# Patient Record
Sex: Female | Born: 1995
Health system: Southern US, Community
[De-identification: ages and names within clinical notes are randomized; demographics above are authoritative.]

## PROBLEM LIST (undated history)

## (undated) DIAGNOSIS — R42 Dizziness and giddiness: Secondary | ICD-10-CM

## (undated) DIAGNOSIS — K5090 Crohn's disease, unspecified, without complications: Secondary | ICD-10-CM

## (undated) DIAGNOSIS — H209 Unspecified iridocyclitis: Secondary | ICD-10-CM

## (undated) DIAGNOSIS — M459 Ankylosing spondylitis of unspecified sites in spine: Secondary | ICD-10-CM

## (undated) HISTORY — DX: Ankylosing spondylitis of unspecified sites in spine: M45.9

## (undated) HISTORY — DX: Unspecified iridocyclitis: H20.9

## (undated) HISTORY — DX: Dizziness and giddiness: R42

## (undated) HISTORY — DX: Crohn's disease, unspecified, without complications: K50.90

---
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
## 2016-07-27 ENCOUNTER — Other Ambulatory Visit (HOSPITAL_COMMUNITY)
Admission: RE | Admit: 2016-07-27 | Discharge: 2016-07-27 | Disposition: A | Discharge status: Home / Self Care | Payer: Managed Care, Other (non HMO) | Source: Ambulatory Visit | Attending: Family Medicine | Admitting: Family Medicine

## 2016-07-27 ENCOUNTER — Encounter: Payer: Self-pay | Admitting: Family Medicine

## 2016-07-27 ENCOUNTER — Ambulatory Visit (INDEPENDENT_AMBULATORY_CARE_PROVIDER_SITE_OTHER): Payer: Managed Care, Other (non HMO) | Admitting: Family Medicine

## 2016-07-27 VITALS — BP 117/76 | HR 73 | Temp 98.7°F | Resp 20 | Ht 68.0 in | Wt 188.5 lb

## 2016-07-27 DIAGNOSIS — Z113 Encounter for screening for infections with a predominantly sexual mode of transmission: Secondary | ICD-10-CM | POA: Insufficient documentation

## 2016-07-27 DIAGNOSIS — Z0000 Encounter for general adult medical examination without abnormal findings: Secondary | ICD-10-CM

## 2016-07-27 DIAGNOSIS — Z111 Encounter for screening for respiratory tuberculosis: Secondary | ICD-10-CM

## 2016-07-27 DIAGNOSIS — Z23 Encounter for immunization: Secondary | ICD-10-CM

## 2016-07-27 DIAGNOSIS — Z79899 Other long term (current) drug therapy: Secondary | ICD-10-CM | POA: Insufficient documentation

## 2016-07-27 DIAGNOSIS — Z30011 Encounter for initial prescription of contraceptive pills: Secondary | ICD-10-CM

## 2016-07-27 DIAGNOSIS — Z3009 Encounter for other general counseling and advice on contraception: Secondary | ICD-10-CM

## 2016-07-27 DIAGNOSIS — Z114 Encounter for screening for human immunodeficiency virus [HIV]: Secondary | ICD-10-CM | POA: Diagnosis not present

## 2016-07-27 DIAGNOSIS — Z3041 Encounter for surveillance of contraceptive pills: Secondary | ICD-10-CM | POA: Insufficient documentation

## 2016-07-27 MED ORDER — NORETHINDRONE 0.35 MG PO TABS: 1.0000 | ORAL_TABLET | Freq: Every day | ORAL | 7 refills | Status: DC

## 2016-07-27 NOTE — Patient Instructions (Addendum)
Tdap given today.  We will call you with lab results.  I have called in your pills, make a PAP appt around October.   Health Maintenance, Female Introduction Adopting a healthy lifestyle and getting preventive care can go a long way to promote health and wellness. Talk with your health care provider about what schedule of regular examinations is right for you. This is a good chance for you to check in with your provider about disease prevention and staying healthy. In between checkups, there are plenty of things you can do on your own. Experts have done a lot of research about which lifestyle changes and preventive measures are most likely to keep you healthy. Ask your health care provider for more information. Weight and diet Eat a healthy diet  Be sure to include plenty of vegetables, fruits, low-fat dairy products, and lean protein.  Do not eat a lot of foods high in solid fats, added sugars, or salt.  Get regular exercise. This is one of the most important things you can do for your health.  Most adults should exercise for at least 150 minutes each week. The exercise should increase your heart rate and make you sweat (moderate-intensity exercise).  Most adults should also do strengthening exercises at least twice a week. This is in addition to the moderate-intensity exercise. Maintain a healthy weight  Body mass index (BMI) is a measurement that can be used to identify possible weight problems. It estimates body fat based on height and weight. Your health care provider can help determine your BMI and help you achieve or maintain a healthy weight.  For females 76 years of age and older:  A BMI below 18.5 is considered underweight.  A BMI of 18.5 to 24.9 is normal.  A BMI of 25 to 29.9 is considered overweight.  A BMI of 30 and above is considered obese. Watch levels of cholesterol and blood lipids  You should start having your blood tested for lipids and cholesterol at 22 years  of age, then have this test every 5 years.  You may need to have your cholesterol levels checked more often if:  Your lipid or cholesterol levels are high.  You are older than 22 years of age.  You are at high risk for heart disease. Cancer screening Lung Cancer  Lung cancer screening is recommended for adults 74-1 years old who are at high risk for lung cancer because of a history of smoking.  A yearly low-dose CT scan of the lungs is recommended for people who:  Currently smoke.  Have quit within the past 15 years.  Have at least a 30-pack-year history of smoking. A pack year is smoking an average of one pack of cigarettes a day for 1 year.  Yearly screening should continue until it has been 15 years since you quit.  Yearly screening should stop if you develop a health problem that would prevent you from having lung cancer treatment. Breast Cancer  Practice breast self-awareness. This means understanding how your breasts normally appear and feel.  It also means doing regular breast self-exams. Let your health care provider know about any changes, no matter how small.  If you are in your 20s or 30s, you should have a clinical breast exam (CBE) by a health care provider every 1-3 years as part of a regular health exam.  If you are 30 or older, have a CBE every year. Also consider having a breast X-ray (mammogram) every year.  If you  have a family history of breast cancer, talk to your health care provider about genetic screening.  If you are at high risk for breast cancer, talk to your health care provider about having an MRI and a mammogram every year.  Breast cancer gene (BRCA) assessment is recommended for women who have family members with BRCA-related cancers. BRCA-related cancers include:  Breast.  Ovarian.  Tubal.  Peritoneal cancers.  Results of the assessment will determine the need for genetic counseling and BRCA1 and BRCA2 testing. Cervical Cancer  Your  health care provider may recommend that you be screened regularly for cancer of the pelvic organs (ovaries, uterus, and vagina). This screening involves a pelvic examination, including checking for microscopic changes to the surface of your cervix (Pap test). You may be encouraged to have this screening done every 3 years, beginning at age 1.  For women ages 81-65, health care providers may recommend pelvic exams and Pap testing every 3 years, or they may recommend the Pap and pelvic exam, combined with testing for human papilloma virus (HPV), every 5 years. Some types of HPV increase your risk of cervical cancer. Testing for HPV may also be done on women of any age with unclear Pap test results.  Other health care providers may not recommend any screening for nonpregnant women who are considered low risk for pelvic cancer and who do not have symptoms. Ask your health care provider if a screening pelvic exam is right for you.  If you have had past treatment for cervical cancer or a condition that could lead to cancer, you need Pap tests and screening for cancer for at least 20 years after your treatment. If Pap tests have been discontinued, your risk factors (such as having a new sexual partner) need to be reassessed to determine if screening should resume. Some women have medical problems that increase the chance of getting cervical cancer. In these cases, your health care provider may recommend more frequent screening and Pap tests. Colorectal Cancer  This type of cancer can be detected and often prevented.  Routine colorectal cancer screening usually begins at 22 years of age and continues through 22 years of age.  Your health care provider may recommend screening at an earlier age if you have risk factors for colon cancer.  Your health care provider may also recommend using home test kits to check for hidden blood in the stool.  A small camera at the end of a tube can be used to examine your  colon directly (sigmoidoscopy or colonoscopy). This is done to check for the earliest forms of colorectal cancer.  Routine screening usually begins at age 55.  Direct examination of the colon should be repeated every 5-10 years through 22 years of age. However, you may need to be screened more often if early forms of precancerous polyps or small growths are found. Skin Cancer  Check your skin from head to toe regularly.  Tell your health care provider about any new moles or changes in moles, especially if there is a change in a mole's shape or color.  Also tell your health care provider if you have a mole that is larger than the size of a pencil eraser.  Always use sunscreen. Apply sunscreen liberally and repeatedly throughout the day.  Protect yourself by wearing long sleeves, pants, a wide-brimmed hat, and sunglasses whenever you are outside. Heart disease, diabetes, and high blood pressure  High blood pressure causes heart disease and increases the risk of stroke.  High blood pressure is more likely to develop in:  People who have blood pressure in the high end of the normal range (130-139/85-89 mm Hg).  People who are overweight or obese.  People who are African American.  If you are 70-34 years of age, have your blood pressure checked every 3-5 years. If you are 73 years of age or older, have your blood pressure checked every year. You should have your blood pressure measured twice-once when you are at a hospital or clinic, and once when you are not at a hospital or clinic. Record the average of the two measurements. To check your blood pressure when you are not at a hospital or clinic, you can use:  An automated blood pressure machine at a pharmacy.  A home blood pressure monitor.  If you are between 85 years and 68 years old, ask your health care provider if you should take aspirin to prevent strokes.  Have regular diabetes screenings. This involves taking a blood sample to  check your fasting blood sugar level.  If you are at a normal weight and have a low risk for diabetes, have this test once every three years after 22 years of age.  If you are overweight and have a high risk for diabetes, consider being tested at a younger age or more often. Preventing infection Hepatitis B  If you have a higher risk for hepatitis B, you should be screened for this virus. You are considered at high risk for hepatitis B if:  You were born in a country where hepatitis B is common. Ask your health care provider which countries are considered high risk.  Your parents were born in a high-risk country, and you have not been immunized against hepatitis B (hepatitis B vaccine).  You have HIV or AIDS.  You use needles to inject street drugs.  You live with someone who has hepatitis B.  You have had sex with someone who has hepatitis B.  You get hemodialysis treatment.  You take certain medicines for conditions, including cancer, organ transplantation, and autoimmune conditions. Hepatitis C  Blood testing is recommended for:  Everyone born from [birth year] through 1966.  Anyone with known risk factors for hepatitis C. Sexually transmitted infections (STIs)  You should be screened for sexually transmitted infections (STIs) including gonorrhea and chlamydia if:  You are sexually active and are younger than 22 years of age.  You are older than 22 years of age and your health care provider tells you that you are at risk for this type of infection.  Your sexual activity has changed since you were last screened and you are at an increased risk for chlamydia or gonorrhea. Ask your health care provider if you are at risk.  If you do not have HIV, but are at risk, it may be recommended that you take a prescription medicine daily to prevent HIV infection. This is called pre-exposure prophylaxis (PrEP). You are considered at risk if:  You are sexually active and do not regularly use  condoms or know the HIV status of your partner(s).  You take drugs by injection.  You are sexually active with a partner who has HIV. Talk with your health care provider about whether you are at high risk of being infected with HIV. If you choose to begin PrEP, you should first be tested for HIV. You should then be tested every 3 months for as long as you are taking PrEP. Pregnancy  If you are premenopausal and  you may become pregnant, ask your health care provider about preconception counseling.  If you may become pregnant, take 400 to 800 micrograms (mcg) of folic acid every day.  If you want to prevent pregnancy, talk to your health care provider about birth control (contraception). Osteoporosis and menopause  Osteoporosis is a disease in which the bones lose minerals and strength with aging. This can result in serious bone fractures. Your risk for osteoporosis can be identified using a bone density scan.  If you are 5 years of age or older, or if you are at risk for osteoporosis and fractures, ask your health care provider if you should be screened.  Ask your health care provider whether you should take a calcium or vitamin D supplement to lower your risk for osteoporosis.  Menopause may have certain physical symptoms and risks.  Hormone replacement therapy may reduce some of these symptoms and risks. Talk to your health care provider about whether hormone replacement therapy is right for you. Follow these instructions at home:  Schedule regular health, dental, and eye exams.  Stay current with your immunizations.  Do not use any tobacco products including cigarettes, chewing tobacco, or electronic cigarettes.  If you are pregnant, do not drink alcohol.  If you are breastfeeding, limit how much and how often you drink alcohol.  Limit alcohol intake to no more than 1 drink per day for nonpregnant women. One drink equals 12 ounces of beer, 5 ounces of wine, or 1 ounces of  hard liquor.  Do not use street drugs.  Do not share needles.  Ask your health care provider for help if you need support or information about quitting drugs.  Tell your health care provider if you often feel depressed.  Tell your health care provider if you have ever been abused or do not feel safe at home. This information is not intended to replace advice given to you by your health care provider. Make sure you discuss any questions you have with your health care provider. Document Released: 12/20/2010 Document Revised: 11/12/2015 Document Reviewed: 03/10/2015  2017 [employer]  Please help [place] help you:  We are honored you have chosen [facility] for your Primary Care home. Below you will find basic instructions that you may need to access in the future. Please help [place] help you by reading the instructions, which cover many of the frequent questions we experience.   Prescription refills and request:  -In order to allow more efficient response time, please call your pharmacy for all refills. They will forward the request electronically to [place]. This allows for the quickest possible response. Request left on a nurse line can take longer to refill, since these are checked as time allows between office patients and other phone calls.  - refill request can take up to 3-5 working days to complete.  - If request is sent electronically and request is appropiate, it is usually completed in 1-2 business days.  - all patients will need to be seen routinely for all chronic medical conditions requiring prescription medications (see follow-up below). If you are overdue for follow up on your condition, you will be asked to make an appointment and we will call in enough medication to cover you until your appointment (up to 30 days).  - all controlled substances will require a face to face visit to request/refill.  - if you desire your prescriptions to go through a new pharmacy, and have an active script  at original pharmacy, you  will need to call your pharmacy and have scripts transferred to new pharmacy. This is completed between the pharmacy locations and not by your provider.    Results: If any images or labs were ordered, it can take up to 1 week to get results depending on the test ordered and the lab/facility running and resulting the test. - Normal or stable results, which do not need further discussion, will be released to your mychart immediately with attached note to you. A call will not be generated for normal results. Please make certain to sign up for mychart. If you have questions on how to activate your mychart you can call the front office.  - If your results need further discussion, our office will attempt to contact you via phone, and if unable to reach you after 2 attempts, we will release your abnormal result to your mychart with instructions.  - All results will be automatically released in mychart after 1 week.  - Your provider will provide you with explanation and instruction on all relevant material in your results. Please keep in mind, results and labs may appear confusing or abnormal to the untrained eye, but it does not mean they are actually abnormal for you personally. If you have any questions about your results that are not covered, or you desire more detailed explanation than what was provided, you should make an appointment with your provider to do so.   Our office handles many outgoing and incoming calls daily. If we have not contacted you within 1 week about your results, please check your mychart to see if there is a message first and if not, then contact our office.  In helping with this matter, you help decrease call volume, and therefore allow [place] to be able to respond to patients needs more efficiently.   Acute office visits (sick visit):  An acute visit is intended for a new problem and are scheduled in shorter time slots to allow schedule openings for patients  with new problems. This is the appropriate visit to discuss a new problem. In order to provide you with excellent quality medical care with proper time for you to explain your problem, have an exam and receive treatment with instructions, these appointments should be limited to one new problem per visit. If you experience a new problem, in which you desire to be addressed, please make an acute office visit, we save openings on the schedule to accommodate you. Please do not save your new problem for any other type of visit, let us take care of it properly and quickly for you.   Follow up visits:  Depending on your condition(s) your provider will need to see you routinely in order to provide you with quality care and prescribe medication(s). Most chronic conditions (Example: hypertension, Diabetes, depression/anxiety... etc), require visits a couple times a year. Your provider will instruct you on proper follow up for your personal medical conditions and history. Please make certain to make follow up appointments for your condition as instructed. Failing to do so could result in lapse in your medication treatment/refills. If you request a refill, and are overdue to be seen on a condition, we will always provide you with a 30 day script (once) to allow you time to schedule.    Medicare wellness (well visit): - we have a wonderful Nurse Maudie Mercury), that will meet with you and provide you will yearly medicare wellness visits. These visits should occur yearly (can not be scheduled less than 1  calendar year apart) and cover preventive health, immunizations, advance directives and screenings you are entitled to yearly through your medicare benefits. Do not miss out on your entitled benefits, this is when medicare will pay for these benefits to be ordered for you.  These are strongly encouraged by your provider and is the appropriate type of visit to make certain you are up to date with all preventive health benefits. If  you have not had your medicare wellness exam in the last 12 months, please make certain to schedule one by calling the office and schedule your medicare wellness with Maudie Mercury as soon as possible.   Yearly physical (well visit):  - Adults are recommended to be seen yearly for physicals. Check with your insurance and date of your last physical, most insurances require one calendar year between physicals. Physicals include all preventive health topics, screenings, medical exam and labs that are appropriate for gender/age and history. You may have fasting labs needed at this visit. This is a well visit (not a sick visit), acute topics should not be covered during this visit.  - Pediatric patients are seen more frequently when they are younger. Your provider will advise you on well child visit timing that is appropriate for your their age. - This is not a medicare wellness visit. Medicare wellness exams do not have an exam portion to the visit. Some medicare companies allow for a physical, some do not allow a yearly physical. If your medicare allows a yearly physical you can schedule the medicare wellness with our nurse Maudie Mercury and have your physical with your provider after, on the same day. Please check with insurance for your full benefits.   Late Policy/No Shows:  - all new patients should arrive 15-30 minutes earlier than appointment to allow [place] time  to  obtain all personal demographics,  insurance information and for you to complete office paperwork. - All established patients should arrive 10-15 minutes earlier than appointment time to update all information and be checked in .  - In our best efforts to run on time, if you are late for your appointment you will be asked to either reschedule or if able, we will work you back into the schedule. There will be a wait time to work you back in the schedule,  depending on availability.  - If you are unable to make it to your appointment as scheduled, please call 24  hours ahead of time to allow [place] to fill the time slot with someone else who needs to be seen. If you do not cancel your appointment ahead of time, you may be charged a no show fee.

## 2016-07-27 NOTE — Progress Notes (Signed)
Patient ID: Toni Livingston, female  DOB: 1995/09/04, 21 y.o.   MRN: [id number] Patient Care Team    Relationship Specialty Notifications Start End  Ma Hillock, DO PCP - General Family Medicine  07/27/16     Subjective:  Toni Livingston is a 21 y.o.  female present for new patient establishment. All past medical history, surgical history, allergies, family history, immunizations, medications and social history were updated/obtained in the electronic medical record today. All recent labs, ED visits and hospitalizations within the last year were reviewed. Moved from [place] in March 2017.  Well women exam: LMP 07/14/2016. Pt states she has not been sexually active in over 4 years. She is wanting to restart her birth control. She is now seeing someone, but has not become sexually active with them yet. She states many BCP pills have made her ill. The last she took that did not was micronor. She has never had a PAP, she has had a pelvic at her prior primary care provider in [place]. Her menstrual cycle  are about every 30 days, strong cramps, heavy for 1-2 days and last about 5 days.  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 Patient does not have medical records with her today. She reports no h/o of chronic physical or mental illness. She is denies any physical or mental causes she would not be able to work with kids. 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 She is in need of a PPD test today. Flu shot UTD 2017. She has not had the tdap in about 10 years. She is agreeable to HIV testing and urine cytology.   Depression screen PHQ 2/9 07/27/2016  Decreased Interest 0  Down, Depressed, Hopeless 0  PHQ - 2 Score 0     Immunization History  Administered Date(s) Administered  . Influenza-Unspecified 03/20/2016  . PPD Test 07/27/2016  . Tdap 07/27/2016      History reviewed. No pertinent past medical history. No Known Allergies Past Surgical History:  Procedure Laterality Date  . BREAST REDUCTION SURGERY  2015   Family History  Problem Relation Age of Onset  . Testicular cancer Father   . Uterine cancer Maternal Aunt    Social History   Social History  . Marital status: Single    Spouse name: N/A  . Number of children: 0  . Years of education: 55   Occupational History  . teaching assistant    Social History Main Topics  . Smoking status: Never Smoker  . Smokeless tobacco: Never Used  . Alcohol use No  . Drug use: No  . Sexual activity: Yes   Other Topics Concern  . Not on file   Social History Narrative   Single. Some college.    Works as Therapist, nutritional.    Drinks caffeine.    Wears seatbelt. Smoke detector in the home.    Exercises routinely.    Feels safe in relationships.          Allergies as of 07/27/2016   No Known Allergies     Medication List       Accurate as of 07/27/16 12:36 PM. Always use your most recent med list.          norethindrone 0.35 MG tablet Commonly known as:  ORTHO MICRONOR Take 1 tablet (0.35 mg total) by mouth daily.  No results found for this or any previous visit (from the past 2160 hour(s)).  Patient was never admitted.   ROS: 14 pt review of systems performed and negative (unless mentioned in an HPI)  Objective: BP 117/76 (BP Location: Right Arm, Patient Position: Sitting, Cuff Size: Normal)   Pulse 73   Temp 98.7 F (37.1 C)   Resp 20   Ht 5\' 8"  (1.727 m)   Wt 188 lb 8 oz (85.5 kg)   LMP 07/14/2016 (Exact Date)   SpO2 99%   BMI 28.66 kg/m  Gen: Afebrile. No acute distress. Nontoxic in appearance, well-developed, well-nourished,  Pleasant caucasian female. HENT: AT. [place]. Bilateral TM visualized and normal in appearance, normal external auditory canal. MMM, no oral lesions, adequate dentition. Bilateral nares within normal limits. Throat  without erythema, ulcerations or exudates. no Cough on exam, no hoarseness on exam. Eyes:Pupils Equal Round Reactive to light, Extraocular movements intact,  Conjunctiva without redness, discharge or icterus. Neck/lymp/endocrine: Supple,no lymphadenopathy, no thyromegaly CV: RRR no murmur, noedema, +2/4 P posterior tibialis pulses.  Chest: CTAB, no wheeze, rhonchi or crackles. Normal Respiratory effort. good Air movement. Abd: Soft. flat. NTND. BS present. no Masses palpated. No hepatosplenomegaly. No rebound tenderness or guarding. Skin: no rashes, purpura or petechiae. Warm and well-perfused. Skin intact. Neuro/Msk:  Normal gait. PERLA. EOMi. Alert. Oriented x3.  Cranial nerves II through XII intact. Muscle strength 5/5 upper/lower extremity. DTRs equal bilaterally. Psych: Normal affect, dress and demeanor. Normal speech. Normal thought content and judgment.   Assessment/plan: Toni Livingston is a 21 y.o. female present for establish care and CPE.  Screening for tuberculosis - PPD, return in 48-72 hours for nurse read. Papers will be completed at that time and returned to patient.  Birth control counseling - Pt provided with BCp. Instructions on use. She will need to schedule PAP before November.  - norethindrone (ORTHO MICRONOR) 0.35 MG tablet; Take 1 tablet (0.35 mg total) by mouth daily.  Dispense: 1 Package; Refill: 7 Screen for STD (sexually transmitted disease) - HIV antibody- pt agreeable.  - Urine cytology ancillary only Encounter for screening for HIV - HIV antibody Immunization due - Tdap vaccine greater than or equal to 7yo IM Encounter for preventive health examination Patient was encouraged to exercise greater than 150 minutes a week. Patient was encouraged to choose a diet filled with fresh fruits and vegetables, and lean meats. AVS provided to patient today for education/recommendation on gender specific health and safety maintenance.  Return in about 1 year (around  07/27/2017) for CPE.  Electronically signed by: Howard Pouch, DO [place]

## 2016-07-28 ENCOUNTER — Telehealth: Payer: Self-pay | Admitting: Family Medicine

## 2016-07-28 LAB — HIV ANTIBODY (ROUTINE TESTING W REFLEX): HIV: NONREACTIVE

## 2016-07-28 LAB — URINE CYTOLOGY ANCILLARY ONLY
CHLAMYDIA, DNA PROBE: NEGATIVE
NEISSERIA GONORRHEA: NEGATIVE

## 2016-07-28 NOTE — Telephone Encounter (Signed)
Pt has a nurse visit either today or Friday to have PPD reading. Please inform her all her labs were normal when she comes in and picks up paperwork.

## 2016-07-29 ENCOUNTER — Ambulatory Visit: Payer: Managed Care, Other (non HMO)

## 2016-07-29 LAB — TB SKIN TEST
INDURATION: 0 mm
TB Skin Test: NEGATIVE

## 2016-07-29 NOTE — Telephone Encounter (Signed)
Lab results reviewed with patient by Mickel Baas CMA

## 2016-09-07 ENCOUNTER — Encounter: Payer: Self-pay | Admitting: Family Medicine

## 2016-09-07 ENCOUNTER — Ambulatory Visit (INDEPENDENT_AMBULATORY_CARE_PROVIDER_SITE_OTHER): Payer: Managed Care, Other (non HMO) | Admitting: Family Medicine

## 2016-09-07 VITALS — BP 112/74 | HR 88 | Temp 98.8°F | Resp 20 | Wt 183.5 lb

## 2016-09-07 DIAGNOSIS — R42 Dizziness and giddiness: Secondary | ICD-10-CM

## 2016-09-07 MED ORDER — ONDANSETRON HCL 4 MG PO TABS: 4.0000 mg | ORAL_TABLET | Freq: Three times a day (TID) | ORAL | 0 refills | Status: DC | PRN

## 2016-09-07 MED ORDER — MECLIZINE HCL 25 MG PO TABS: 25.0000 mg | ORAL_TABLET | Freq: Three times a day (TID) | ORAL | 0 refills | Status: DC | PRN

## 2016-09-07 NOTE — Progress Notes (Signed)
Toni Livingston , May 25, 1996, 21 y.o., female MRN: [id number] Patient Care Team    Relationship Specialty Notifications Start End  Ma Hillock, DO PCP - General Family Medicine  07/27/16     CC: dizziness  Subjective: Pt presents for an OV with complaints of dizziness of 1 day duration. Pt states this has occurred in the past. She started to experience room spinning sensations about 4 years ago about every 2-4 times a year. Typically it last a day, and then self resolves. She is uncertain if events are proceeded with an acute illness or not. This event started yesterday morning upon waking up and has lasted. It is unusual for her for her to have it last past 1 day. She was sick about 1 week ago with a cold.  Associated symptoms include left ear fullness, right ear ringing yesterday (not current), mild headache (resolved) and nausea. Moving her head or transitioning make it worse and the room "moves". She denies fever, chills, hearing loss, headache, visual changes or syncope. She has never seen a doctor for this condition. She has tried benadryl when it occurs and it can be helpful. She is on BCP that was restarted 1 month ago. She states she has been on BCP on/off in the past and this has occurred both on and off medications.     Depression screen PHQ 2/9 07/27/2016  Decreased Interest 0  Down, Depressed, Hopeless 0  PHQ - 2 Score 0    No Known Allergies Social History  Substance Use Topics  . Smoking status: Never Smoker  . Smokeless tobacco: Never Used  . Alcohol use No   History reviewed. No pertinent past medical history. Past Surgical History:  Procedure Laterality Date  . BREAST REDUCTION SURGERY  2015   Family History  Problem Relation Age of Onset  . Testicular cancer Father   . Uterine cancer Maternal Aunt    Allergies as of 09/07/2016   No Known Allergies     Medication List       Accurate as of 09/07/16 10:36 AM. Always use your most recent med list.          norethindrone 0.35 MG tablet Commonly known as:  ORTHO MICRONOR Take 1 tablet (0.35 mg total) by mouth daily.       No results found for this or any previous visit (from the past 24 hour(s)). No results found.   ROS: Negative, with the exception of above mentioned in HPI   Objective:  BP 112/74 (BP Location: Right Arm, Patient Position: Sitting, Cuff Size: Large)   Pulse 88   Temp 98.8 F (37.1 C)   Resp 20   Wt 183 lb 8 oz (83.2 kg)   SpO2 97%   BMI 27.90 kg/m  Body mass index is 27.9 kg/m. Gen: Afebrile. No acute distress. Nontoxic in appearance, well developed, well nourished.  HENT: AT. [place]. Bilateral TM visualized with bilateral fullness. MMM, no oral lesions. Bilateral nares WNL. Throat without erythema or exudates. No cough or hoarseness.  Eyes:Pupils Equal Round Reactive to light, Extraocular movements intact,  Conjunctiva without redness, discharge or icterus. Neck/lymp/endocrine: Supple,no lymphadenopathy CV: RRR Chest: CTAB, no wheeze or crackles.  Abd: Soft. NTND. BS present Neuro:  Normal gait. PERLA. EOMi. Alert. Oriented x3  Psych: Normal affect, dress and demeanor. Normal speech. Normal thought content and judgment. Dix-hal pike positive right.    Assessment/Plan: Toni Livingston is a 21 y.o. female present for OV for  Vertigo -  discussed potential ddx of BPPV, inner ear issues, atypical migraines - Hydrate, meclizine, flonase start, zofran and Epley maneuver  info provided.  - if condition worsens after above regimen, occurs more frequently or does not resolve would want to investigate further.  - F/u PRN   Reviewed expectations re: course of current medical issues.  Discussed self-management of symptoms.  Outlined signs and symptoms indicating need for more acute intervention.  Patient verbalized understanding and all questions were answered.  Patient received an After-Visit Summary.   electronically signed by:  Howard Pouch, DO  [place]

## 2016-09-07 NOTE — Patient Instructions (Signed)
Benign Positional Vertigo Vertigo is the feeling that you or your surroundings are moving when they are not. Benign positional vertigo is the most common form of vertigo. The cause of this condition is not serious (is benign). This condition is triggered by certain movements and positions (is positional). This condition can be dangerous if it occurs while you are doing something that could endanger you or others, such as driving. What are the causes? In many cases, the cause of this condition is not known. It may be caused by a disturbance in an area of the inner ear that helps your brain to sense movement and balance. This disturbance can be caused by a viral infection (labyrinthitis), head injury, or repetitive motion. What increases the risk? This condition is more likely to develop in:  Women.  People who are 50 years of age or older. What are the signs or symptoms? Symptoms of this condition usually happen when you move your head or your eyes in different directions. Symptoms may start suddenly, and they usually last for less than a minute. Symptoms may include:  Loss of balance and falling.  Feeling like you are spinning or moving.  Feeling like your surroundings are spinning or moving.  Nausea and vomiting.  Blurred vision.  Dizziness.  Involuntary eye movement (nystagmus). Symptoms can be mild and cause only slight annoyance, or they can be severe and interfere with daily life. Episodes of benign positional vertigo may return (recur) over time, and they may be triggered by certain movements. Symptoms may improve over time. How is this diagnosed? This condition is usually diagnosed by medical history and a physical exam of the head, neck, and ears. You may be referred to a health care provider who specializes in ear, nose, and throat (ENT) problems (otolaryngologist) or a provider who specializes in disorders of the nervous system (neurologist). You may have additional testing,  including:  MRI.  A CT scan.  Eye movement tests. Your health care provider may ask you to change positions quickly while he or she watches you for symptoms of benign positional vertigo, such as nystagmus. Eye movement may be tested with an electronystagmogram (ENG), caloric stimulation, the Dix-Hallpike test, or the roll test.  An electroencephalogram (EEG). This records electrical activity in your brain.  Hearing tests. How is this treated? Usually, your health care provider will treat this by moving your head in specific positions to adjust your inner ear back to normal. Surgery may be needed in severe cases, but this is rare. In some cases, benign positional vertigo may resolve on its own in 2-4 weeks. Follow these instructions at home: Safety   Move slowly.Avoid sudden body or head movements.  Avoid driving.  Avoid operating heavy machinery.  Avoid doing any tasks that would be dangerous to you or others if a vertigo episode would occur.  If you have trouble walking or keeping your balance, try using a cane for stability. If you feel dizzy or unstable, sit down right away.  Return to your normal activities as told by your health care provider. Ask your health care provider what activities are safe for you. General instructions   Take over-the-counter and prescription medicines only as told by your health care provider.  Avoid certain positions or movements as told by your health care provider.  Drink enough fluid to keep your urine clear or pale yellow.  Keep all follow-up visits as told by your health care provider. This is important. Contact a health care provider   if:  You have a fever.  Your condition gets worse or you develop new symptoms.  Your family or friends notice any behavioral changes.  Your nausea or vomiting gets worse.  You have numbness or a "pins and needles" sensation. Get help right away if:  You have difficulty speaking or moving.  You are  always dizzy.  You faint.  You develop severe headaches.  You have weakness in your legs or arms.  You have changes in your hearing or vision.  You develop a stiff neck.  You develop sensitivity to light. This information is not intended to replace advice given to you by your health care provider. Make sure you discuss any questions you have with your health care provider. Document Released: 03/14/2006 Document Revised: 11/12/2015 Document Reviewed: 09/29/2014 Elsevier Interactive Patient Education  2017 [employer].   How to Perform the Epley Maneuver The Epley maneuver is an exercise that relieves symptoms of vertigo. Vertigo is the feeling that you or your surroundings are moving when they are not. When you feel vertigo, you may feel like the room is spinning and have trouble walking. Dizziness is a little different than vertigo. When you are dizzy, you may feel unsteady or light-headed. You can do this maneuver at home whenever you have symptoms of vertigo. You can do it up to 3 times a day until your symptoms go away. Even though the Epley maneuver may relieve your vertigo for a few weeks, it is possible that your symptoms will return. This maneuver relieves vertigo, but it does not relieve dizziness. What are the risks? If it is done correctly, the Epley maneuver is considered safe. Sometimes it can lead to dizziness or nausea that goes away after a short time. If you develop other symptoms, such as changes in vision, weakness, or numbness, stop doing the maneuver and call your health care provider. How to perform the Epley maneuver 1. Sit on the edge of a bed or table with your back straight and your legs extended or hanging over the edge of the bed or table. 2. Turn your head halfway toward the affected ear or side. 3. Lie backward quickly with your head turned until you are lying flat on your back. You may want to position a pillow under your shoulders. 4. Hold this position  for 30 seconds. You may experience an attack of vertigo. This is normal. 5. Turn your head to the opposite direction until your unaffected ear is facing the floor. 6. Hold this position for 30 seconds. You may experience an attack of vertigo. This is normal. Hold this position until the vertigo stops. 7. Turn your whole body to the same side as your head. Hold for another 30 seconds. 8. Sit back up. You can repeat this exercise up to 3 times a day. Follow these instructions at home:  After doing the Epley maneuver, you can return to your normal activities.  Ask your health care provider if there is anything you should do at home to prevent vertigo. He or she may recommend that you:  Keep your head raised (elevated) with two or more pillows while you sleep.  Do not sleep on the side of your affected ear.  Get up slowly from bed.  Avoid sudden movements during the day.  Avoid extreme head movement, like looking up or bending over. Contact a health care provider if:  Your vertigo gets worse.  You have other symptoms, including:  Nausea.  Vomiting.  Headache. Get  help right away if:  You have vision changes.  You have a severe or worsening headache or neck pain.  You cannot stop vomiting.  You have new numbness or weakness in any part of your body. Summary  Vertigo is the feeling that you or your surroundings are moving when they are not.  The Epley maneuver is an exercise that relieves symptoms of vertigo.  If the Epley maneuver is done correctly, it is considered safe. You can do it up to 3 times a day. This information is not intended to replace advice given to you by your health care provider. Make sure you discuss any questions you have with your health care provider. Document Released: 06/11/2013 Document Revised: 04/26/2016 Document Reviewed: 04/26/2016 Elsevier Interactive Patient Education  2017 [employer]   Labyrinthitis Labyrinthitis is an infection of  the inner ear. Your inner ear is a system of tubes and canals (labyrinth). These are filled with fluid. Nerve cells in your inner ear send signals for hearing and balance to your brain. When tiny germs get inside the tubes and canals, they harm the cells that send messages to the brain. This can cause changes in hearing and balance. Follow these instructions at home:  Take medicines only as told by your doctor.  If you were prescribed an antibiotic medicine, finish all of it even if you start to feel better.  Rest as much as possible.  Avoid loud noises and bright lights.  Do not make sudden movements until any dizziness goes away.  Do not drive until your doctor says that you can.  Drink enough fluid to keep your pee (urine) clear or pale yellow.  Work with a physical therapist if you still feel dizzy after several weeks. A therapist can teach you exercises to help you deal with your dizziness.  Keep all follow-up visits as told by your doctor. This is important. Contact a doctor if:  Your symptoms do not get better with medicines.  You do not get better after two weeks.  You have a fever. Get help right away if:  You are very dizzy.  You keep throwing up (vomiting) or keep feeling sick to your stomach (nauseous).  Your hearing gets a lot worse very quickly. This information is not intended to replace advice given to you by your health care provider. Make sure you discuss any questions you have with your health care provider. Document Released: 06/06/2005 Document Revised: 11/12/2015 Document Reviewed: 03/18/2014 Elsevier Interactive Patient Education  2017 [employer].

## 2016-10-28 ENCOUNTER — Encounter: Payer: Self-pay | Admitting: Family Medicine

## 2016-10-28 ENCOUNTER — Ambulatory Visit (INDEPENDENT_AMBULATORY_CARE_PROVIDER_SITE_OTHER): Payer: Managed Care, Other (non HMO) | Admitting: Family Medicine

## 2016-10-28 VITALS — BP 101/68 | HR 84 | Temp 98.1°F | Resp 16 | Ht 68.0 in | Wt 184.0 lb

## 2016-10-28 DIAGNOSIS — N3000 Acute cystitis without hematuria: Secondary | ICD-10-CM | POA: Diagnosis not present

## 2016-10-28 LAB — POCT URINALYSIS DIPSTICK
Bilirubin, UA: NEGATIVE
Glucose, UA: NEGATIVE
KETONES UA: NEGATIVE
Nitrite, UA: NEGATIVE
PH UA: 6 (ref 5.0–8.0)
PROTEIN UA: NEGATIVE
SPEC GRAV UA: 1.015 (ref 1.010–1.025)
UROBILINOGEN UA: 0.2 U/dL

## 2016-10-28 MED ORDER — CIPROFLOXACIN HCL 500 MG PO TABS: 500.0000 mg | ORAL_TABLET | Freq: Two times a day (BID) | ORAL | 0 refills | Status: AC

## 2016-10-28 NOTE — Progress Notes (Signed)
OFFICE VISIT  10/28/2016   CC:  Chief Complaint  Patient presents with  . Urinary Frequency    off and on x 3-4 weeks, has took otc uricalm and some left over antibiotic   HPI:    Patient is a 21 y.o.  female who presents for urinary symptoms. Onset 3-4 weeks ago of urinary frequency, having to urinate but nothing comes out, dysuria.  Uricalm otc helped. Had one episode of gross hematuria about 2 wks, then 2 d/a.  Most recent uricalm was 2 d/a. No fever.  No abd pain or flank pain, no nausea.  Says this is what it felt like when she had UTI 2-3 yrs ago.   Took some leftover abx at home, does not recall the name---took it bid x 7d.  Sx's got a little better, then 2d after stopping, all symptoms returned the same.  She is not sexually active and says there is no way she is pregnant. LMP was 09/28/16.  Past Medical History:  Diagnosis Date  . Vertigo     Past Surgical History:  Procedure Laterality Date  . BREAST REDUCTION SURGERY  2015    Outpatient Medications Prior to Visit  Medication Sig Dispense Refill  . norethindrone (ORTHO MICRONOR) 0.35 MG tablet Take 1 tablet (0.35 mg total) by mouth daily. 1 Package 7  . meclizine (ANTIVERT) 25 MG tablet Take 1 tablet (25 mg total) by mouth 3 (three) times daily as needed. (Patient not taking: Reported on 10/28/2016) 60 tablet 0  . ondansetron (ZOFRAN) 4 MG tablet Take 1 tablet (4 mg total) by mouth every 8 (eight) hours as needed for nausea or vomiting. (Patient not taking: Reported on 10/28/2016) 20 tablet 0   No facility-administered medications prior to visit.     No Known Allergies  ROS As per HPI  PE: Blood pressure 101/68, pulse 84, temperature 98.1 F (36.7 C), temperature source Oral, resp. rate 16, height 5\' 8"  (1.727 m), weight 184 lb (83.5 kg), last menstrual period 09/28/2016, SpO2 98 %. Gen: Alert, well appearing.  Patient is oriented to person, place, time, and situation. AFFECT: pleasant, lucid thought and  speech. No further exam today.  LABS:  CC UA today: trace intact blood, trace LEU, o/w normal.  Urine slightly cloudy.    Chemistry   No results found for: NA, K, CL, CO2, BUN, CREATININE, GLU No results found for: CALCIUM, ALKPHOS, AST, ALT, BILITOT    IMPRESSION AND PLAN:  Urinary tract infection with hematuria:  Cipro 500 mg bid x 7d. Urine sent for c/s today.  Signs/symptoms to call or return for were reviewed and pt expressed understanding.  An After Visit Summary was printed and given to the patient.  FOLLOW UP: Return if symptoms worsen or fail to improve.  Signed:  Crissie Sickles, MD           10/28/2016

## 2016-10-30 LAB — URINE CULTURE

## 2017-02-08 ENCOUNTER — Other Ambulatory Visit: Payer: Self-pay | Admitting: *Deleted

## 2017-02-08 ENCOUNTER — Other Ambulatory Visit: Payer: Self-pay | Admitting: Family Medicine

## 2017-02-08 DIAGNOSIS — Z3009 Encounter for other general counseling and advice on contraception: Secondary | ICD-10-CM

## 2017-02-08 MED ORDER — NORETHINDRONE 0.35 MG PO TABS: 1.0000 | ORAL_TABLET | Freq: Every day | ORAL | 2 refills | Status: DC

## 2017-02-17 ENCOUNTER — Encounter: Payer: Self-pay | Admitting: Family Medicine

## 2017-02-17 ENCOUNTER — Ambulatory Visit (INDEPENDENT_AMBULATORY_CARE_PROVIDER_SITE_OTHER): Payer: Managed Care, Other (non HMO) | Admitting: Family Medicine

## 2017-02-17 VITALS — BP 97/66 | HR 71 | Temp 98.3°F | Resp 20 | Wt 184.0 lb

## 2017-02-17 DIAGNOSIS — M79604 Pain in right leg: Secondary | ICD-10-CM

## 2017-02-17 DIAGNOSIS — M545 Low back pain: Secondary | ICD-10-CM

## 2017-02-17 MED ORDER — CYCLOBENZAPRINE HCL 5 MG PO TABS: 5.0000 mg | ORAL_TABLET | Freq: Three times a day (TID) | ORAL | 1 refills | Status: DC | PRN

## 2017-02-17 MED ORDER — METHYLPREDNISOLONE ACETATE 80 MG/ML IJ SUSP
80.0000 mg | Freq: Once | INTRAMUSCULAR | Status: AC
  Administered 2017-02-17: 80 mg via INTRAMUSCULAR

## 2017-02-17 MED ORDER — NAPROXEN 500 MG PO TABS: 500.0000 mg | ORAL_TABLET | Freq: Two times a day (BID) | ORAL | 0 refills | Status: DC

## 2017-02-17 NOTE — Progress Notes (Signed)
Toni Livingston , 1996/01/16, 21 y.o., female MRN: [id number] Patient Care Team    Relationship Specialty Notifications Start End  Ma Hillock, DO PCP - General Family Medicine  07/27/16     Chief Complaint  Patient presents with  . Back Pain    lifting injury     Subjective: Pt presents for an OV with complaints of back pain  of today duration.  Associated symptoms include lumbar back pain, with radiation to bilateral calves after picking up a box at work. She works at [employer] and felt immediate discomfort after picking up a box. She has had similar discomfort in the past affecting only her left SI and sciatica. She reports she performs stretches before work and typically never has an issue with lifting. She doe snot wear a back brace at work. She has tried taking a dose of naprosyn today, without much relief. She denies bladder or bowel dysfunction. The pain is worse with bending forward.  Depression screen PHQ 2/9 07/27/2016  Decreased Interest 0  Down, Depressed, Hopeless 0  PHQ - 2 Score 0    No Known Allergies Social History  Substance Use Topics  . Smoking status: Never Smoker  . Smokeless tobacco: Never Used  . Alcohol use No   Past Medical History:  Diagnosis Date  . Vertigo    Past Surgical History:  Procedure Laterality Date  . BREAST REDUCTION SURGERY  2015   Family History  Problem Relation Age of Onset  . Testicular cancer Father   . Uterine cancer Maternal Aunt    Allergies as of 02/17/2017   No Known Allergies     Medication List       Accurate as of 02/17/17  1:37 PM. Always use your most recent med list.          norethindrone 0.35 MG tablet Commonly known as:  ORTHO MICRONOR Take 1 tablet (0.35 mg total) by mouth daily.       All past medical history, surgical history, allergies, family history, immunizations andmedications were updated in the EMR today and reviewed under the history and medication portions of their EMR.      ROS: Negative, with the exception of above mentioned in HPI   Objective:  BP 97/66 (BP Location: Left Arm, Patient Position: Sitting, Cuff Size: Large)   Pulse 71   Temp 98.3 F (36.8 C)   Resp 20   Wt 184 lb (83.5 kg)   SpO2 99%   BMI 27.98 kg/m  Body mass index is 27.98 kg/m. Gen: Afebrile. No acute distress. Nontoxic in appearance, well developed, well nourished.  HENT: AT. [name]. MMM Eyes:Pupils Equal Round Reactive to light, Extraocular movements intact,  Conjunctiva without redness, discharge or icterus. MSK: no erythema, no soft tissue swelling lumbar spine. No bone TTP lumbar, sacral or SI. TTP bilateral piriformis. Full ROM present with discomfort Left SB, flexion and extension lumbar spine. 5/5 bilateral LE MS. NV intact distally.  Skin: no rashes, purpura or petechiae.  Neuro: Normal gait. PERLA. EOMi. Alert. Oriented x3   No exam data present No results found. No results found for this or any previous visit (from the past 24 hour(s)).  Assessment/Plan: Toni Livingston is a 21 y.o. female present for OV for  1. Lumbar pain with radiation down both legs - cyclobenzaprine (FLEXERIL) 5 MG tablet; Take 1 tablet (5 mg total) by mouth 3 (three) times daily as needed for muscle spasms.  Dispense: 30 tablet; Refill: 1 -  naproxen (NAPROSYN) 500 MG tablet; Take 1 tablet (500 mg total) by mouth 2 (two) times daily with a meal.  Dispense: 30 tablet; Refill: 0 - methylPREDNISolone acetate (DEPO-MEDROL) injection 80 mg; Inject 1 mL (80 mg total) into the muscle once. - IM depo medrol today.  - flexeril 5 mg and naproxen 500 BID prescribed today.  - ice/heat instructions provided to use over the weekend. Rest, no lifting. Work excuse provided today. May return next week as scheduled if improved. - encouraged use of back brace at work, as well as continue stretches.  - follow up 2-4 weeks if not improving or worsening.   Reviewed expectations re: course of current medical  issues.  Discussed self-management of symptoms.  Outlined signs and symptoms indicating need for more acute intervention.  Patient verbalized understanding and all questions were answered.  Patient received an After-Visit Summary.    No orders of the defined types were placed in this encounter.    Note is dictated utilizing voice recognition software. Although note has been proof read prior to signing, occasional typographical errors still can be missed. If any questions arise, please do not hesitate to call for verification.   electronically signed by:  Howard Pouch, DO  [place]

## 2017-02-17 NOTE — Patient Instructions (Signed)
Rest, ice for 2-3 days then heat application (never more than 15 minutes at a time).  No lifting for 5-7 days. Look into back brace (employers can provide this for you).  Start light stretches in about 5-7 days.   Start naproxen this evening with a meal, and take every 12 hours with food for 7 days.   Start flexeril every 12 hours if able, may make you sleepy. If makes you too sleepy take only at night or not driving.     Sciatica Sciatica is pain, numbness, weakness, or tingling along the path of the sciatic nerve. The sciatic nerve starts in the lower back and runs down the back of each leg. The nerve controls the muscles in the lower leg and in the back of the knee. It also provides feeling (sensation) to the back of the thigh, the lower leg, and the sole of the foot. Sciatica is a symptom of another medical condition that pinches or puts pressure on the sciatic nerve. Generally, sciatica only affects one side of the body. Sciatica usually goes away on its own or with treatment. In some cases, sciatica may keep coming back (recur). What are the causes? This condition is caused by pressure on the sciatic nerve, or pinching of the sciatic nerve. This may be the result of:  A disk in between the bones of the spine (vertebrae) bulging out too far (herniated disk).  Age-related changes in the spinal disks (degenerative disk disease).  A pain disorder that affects a muscle in the buttock (piriformis syndrome).  Extra bone growth (bone spur) near the sciatic nerve.  An injury or break (fracture) of the pelvis.  Pregnancy.  Tumor (rare).  What increases the risk? The following factors may make you more likely to develop this condition:  Playing sports that place pressure or stress on the spine, such as football or weight lifting.  Having poor strength and flexibility.  A history of back injury.  A history of back surgery.  Sitting for long periods of time.  Doing activities that  involve repetitive bending or lifting.  Obesity.  What are the signs or symptoms? Symptoms can vary from mild to very severe, and they may include:  Any of these problems in the lower back, leg, hip, or buttock: ? Mild tingling or dull aches. ? Burning sensations. ? Sharp pains.  Numbness in the back of the calf or the sole of the foot.  Leg weakness.  Severe back pain that makes movement difficult.  These symptoms may get worse when you cough, sneeze, or laugh, or when you sit or stand for long periods of time. Being overweight may also make symptoms worse. In some cases, symptoms may recur over time. How is this diagnosed? This condition may be diagnosed based on:  Your symptoms.  A physical exam. Your health care provider may ask you to do certain movements to check whether those movements trigger your symptoms.  You may have tests, including: ? Blood tests. ? X-rays. ? MRI. ? CT scan.  How is this treated? In many cases, this condition improves on its own, without any treatment. However, treatment may include:  Reducing or modifying physical activity during periods of pain.  Exercising and stretching to strengthen your abdomen and improve the flexibility of your spine.  Icing and applying heat to the affected area.  Medicines that help: ? To relieve pain and swelling. ? To relax your muscles.  Injections of medicines that help to relieve pain,  irritation, and inflammation around the sciatic nerve (steroids).  Surgery.  Follow these instructions at home: Medicines  Take over-the-counter and prescription medicines only as told by your health care provider.  Do not drive or operate heavy machinery while taking prescription pain medicine. Managing pain  If directed, apply ice to the affected area. ? Put ice in a plastic bag. ? Place a towel between your skin and the bag. ? Leave the ice on for 20 minutes, 2-3 times a day.  After icing, apply heat to the  affected area before you exercise or as often as told by your health care provider. Use the heat source that your health care provider recommends, such as a moist heat pack or a heating pad. ? Place a towel between your skin and the heat source. ? Leave the heat on for 20-30 minutes. ? Remove the heat if your skin turns bright red. This is especially important if you are unable to feel pain, heat, or cold. You may have a greater risk of getting burned. Activity  Return to your normal activities as told by your health care provider. Ask your health care provider what activities are safe for you. ? Avoid activities that make your symptoms worse.  Take brief periods of rest throughout the day. Resting in a lying or standing position is usually better than sitting to rest. ? When you rest for longer periods, mix in some mild activity or stretching between periods of rest. This will help to prevent stiffness and pain. ? Avoid sitting for long periods of time without moving. Get up and move around at least one time each hour.  Exercise and stretch regularly, as told by your health care provider.  Do not lift anything that is heavier than 10 lb (4.5 kg) while you have symptoms of sciatica. When you do not have symptoms, you should still avoid heavy lifting, especially repetitive heavy lifting.  When you lift objects, always use proper lifting technique, which includes: ? Bending your knees. ? Keeping the load close to your body. ? Avoiding twisting. General instructions  Use good posture. ? Avoid leaning forward while sitting. ? Avoid hunching over while standing.  Maintain a healthy weight. Excess weight puts extra stress on your back and makes it difficult to maintain good posture.  Wear supportive, comfortable shoes. Avoid wearing high heels.  Avoid sleeping on a mattress that is too soft or too hard. A mattress that is firm enough to support your back when you sleep may help to reduce  your pain.  Keep all follow-up visits as told by your health care provider. This is important. Contact a health care provider if:  You have pain that wakes you up when you are sleeping.  You have pain that gets worse when you lie down.  Your pain is worse than you have experienced in the past.  Your pain lasts longer than 4 weeks.  You experience unexplained weight loss. Get help right away if:  You lose control of your bowel or bladder (incontinence).  You have: ? Weakness in your lower back, pelvis, buttocks, or legs that gets worse. ? Redness or swelling of your back. ? A burning sensation when you urinate. This information is not intended to replace advice given to you by your health care provider. Make sure you discuss any questions you have with your health care provider. Document Released: 05/31/2001 Document Revised: 11/10/2015 Document Reviewed: 02/13/2015 Elsevier Interactive Patient Education  2017 [employer].

## 2017-03-06 DIAGNOSIS — D225 Melanocytic nevi of trunk: Secondary | ICD-10-CM | POA: Diagnosis not present

## 2017-03-06 DIAGNOSIS — L700 Acne vulgaris: Secondary | ICD-10-CM | POA: Diagnosis not present

## 2017-03-06 DIAGNOSIS — L738 Other specified follicular disorders: Secondary | ICD-10-CM | POA: Diagnosis not present

## 2017-03-09 ENCOUNTER — Encounter: Payer: Self-pay | Admitting: Family Medicine

## 2017-03-09 ENCOUNTER — Ambulatory Visit (INDEPENDENT_AMBULATORY_CARE_PROVIDER_SITE_OTHER): Payer: 59 | Admitting: Family Medicine

## 2017-03-09 DIAGNOSIS — Z23 Encounter for immunization: Secondary | ICD-10-CM | POA: Diagnosis not present

## 2017-03-09 DIAGNOSIS — M545 Low back pain: Secondary | ICD-10-CM | POA: Diagnosis not present

## 2017-03-09 DIAGNOSIS — M79604 Pain in right leg: Secondary | ICD-10-CM

## 2017-03-09 MED ORDER — NAPROXEN 500 MG PO TABS
500.0000 mg | ORAL_TABLET | Freq: Two times a day (BID) | ORAL | 0 refills | Status: DC
Start: 2017-03-09 — End: 2017-09-13

## 2017-03-09 NOTE — Patient Instructions (Signed)
Start stretches today.  Start naproxen every 12 hours with food for 7 days. More called in .  Wear a belt at work and perform stretches before and after work.  If not improving in 1 week, or resolving by 2 weeks, then call in and we will place a referral to either PT or OMT, which ever you prefer.   Spondylolisthesis Rehab Ask your health care provider which exercises are safe for you. Do exercises exactly as told by your health care provider and adjust them as directed. It is normal to feel mild stretching, pulling, tightness, or discomfort as you do these exercises, but you should stop right away if you feel sudden pain or your pain gets worse. Do not begin these exercises until told by your health care provider. Stretching and range of motion exercises These exercises warm up your muscles and joints and improve the movement and flexibility of your hips and your back. These exercises may also help to relieve pain, numbness, and tingling. Exercise A: Single knee to chest  1. Lie on your back on a firm surface with both legs straight. 2. Bend one of your knees. Use your hands to move your knee up toward your chest until you feel a gentle stretch in your lower back and buttock. ? Hold your leg in this position by holding onto the front of your knee. ? Keep your other leg as straight as possible. 3. Hold for __________ seconds. 4. Slowly return to the starting position. 5. Repeat this exercise with your other leg. Repeat __________ times. Complete this exercise __________ times a day. Exercise B: Double knee to chest  1. Lie on your back on a firm surface with both legs straight. 2. Bend one of your knees and move it toward your chest until you feel a gentle stretch in your lower back and buttock. 3. Tense your abdominal muscles and repeat the previous step with your other leg. 4. Hold both of your legs in this position by holding onto the backs of your thighs or the fronts of your  knees. 5. Hold for __________ seconds. 6. Tense your abdominal muscles and slowly move your legs back to the floor, one leg at a time. Repeat __________ times. Complete this exercise __________ times a day. Strengthening exercises These exercises build strength and endurance in your back. Endurance is the ability to use your muscles for a long time, even after they get tired. Exercise C: Pelvic tilt 1. Lie on your back on a firm bed or the floor. Bend your knees and keep your feet flat. 2. Tense your abdominal muscles. Tip your pelvis up toward the ceiling and flatten your lower back into the floor. ? To help with this exercise, you may place a small towel under your lower back and try to push your back into the towel. 3. Hold for __________ seconds. 4. Let your muscles relax completely before you repeat this exercise. Repeat __________ times. Complete this exercise __________ times a day. Exercise D: Abdominal crunch  1. Lie on your back on a firm surface. Bend your knees and keep your feet flat. Cross your arms over your chest. 2. Tuck your chin down toward your chest, without bending your neck. 3. Use your abdominal muscles to lift your upper body off of the ground, straight up into the air. ? Try to lift yourself until your shoulder blades are off the ground. You may need to work up to this. ? Keep your lower back on  the ground while you crunch upward. ? Do not hold your breath. 4. Slowly lower yourself down. Keep your abdominal muscles tense until you are back to the starting position. Repeat __________ times. Complete this exercise __________ times a day. Exercise E: Alternating arm and leg raises  1. Get on your hands and knees on a firm surface. If you are on a hard floor, you may want to use padding to cushion your knees, such as an exercise mat. 2. Line up your arms and legs. Your hands should be below your shoulders, and your knees should be below your hips. 3. Lift your left  leg behind you. At the same time, raise your right arm and straighten it in front of you. ? Do not lift your leg higher than your hip. ? Do not lift your arm higher than your shoulder. ? Keep your abdominal and back muscles tight. ? Keep your hips facing the ground. ? Do not arch your back. ? Keep your balance carefully, and do not hold your breath. 4. Hold for __________ seconds. 5. Slowly return to the starting position and repeat with your right leg and your left arm. Repeat __________ times. Complete this exercise __________ times a day. Posture and body mechanics  Body mechanics refers to the movements and positions of your body while you do your daily activities. Posture is part of body mechanics. Good posture and healthy body mechanics can help to relieve stress in your body's tissues and joints. Good posture means that your spine is in its natural S-curve position (your spine is neutral), your shoulders are pulled back slightly, and your head is not tipped forward. The following are general guidelines for applying improved posture and body mechanics to your everyday activities. Standing   When standing, keep your spine neutral and your feet about hip-width apart. Keep a slight bend in your knees. Your ears, shoulders, and hips should line up.  When you do a task in which you stand in one place for a long time, place one foot up on a stable object that is 2-4 inches (5-10 cm) high, such as a footstool. This helps keep your spine neutral. Sitting   When sitting, keep your spine neutral and keep your feet flat on the floor. Use a footrest, if necessary, and keep your thighs parallel to the floor. Avoid rounding your shoulders, and avoid tilting your head forward.  When working at a desk or a computer, keep your desk at a height where your hands are slightly lower than your elbows. Slide your chair under your desk so you are close enough to maintain good posture.  When working at a  computer, place your monitor at a height where you are looking straight ahead and you do not have to tilt your head forward or downward to look at the screen. Resting  When lying down and resting, avoid positions that are most painful for you.  If you have pain with activities such as sitting, bending, stooping, or squatting (flexion-based activities), lie in a position in which your body does not bend very much. For example, avoid curling up on your side with your arms and knees near your chest (fetal position).  If you have pain with activities such as standing for a long time or reaching with your arms (extension-based activities), lie with your spine in a neutral position and bend your knees slightly. Try the following positions: ? Lying on your side with a pillow between your knees. ? Lying  on your back with a pillow under your knees.  Lifting   When lifting objects, keep your feet at least shoulder-width apart and tighten your abdominal muscles.  Bend your knees and hips and keep your spine neutral. It is important to lift using the strength of your legs, not your back. Do not lock your knees straight out.  Always ask for help to lift heavy or awkward objects. This information is not intended to replace advice given to you by your health care provider. Make sure you discuss any questions you have with your health care provider. Document Released: 06/06/2005 Document Revised: 02/11/2016 Document Reviewed: 03/17/2015 Elsevier Interactive Patient Education  [employer].

## 2017-03-09 NOTE — Progress Notes (Signed)
Toni Livingston , Nov 16, 1995, 21 y.o., female MRN: [id number] Patient Care Team    Relationship Specialty Notifications Start End  Ma Hillock, DO PCP - General Family Medicine  07/27/16     Chief Complaint  Patient presents with  . Back Pain    lumbar     Subjective:  Patient presents to follow-up on back pain after injury at work 02/17/2017. She states she felt better after taking the Flexeril and naproxen for a week. She had been using heat application as well. She stopped using the medications, and has noticed her pain returning over the last 6 days. She states last Wednesday she did not even go to work because it was bothering her. She points to the right lower lumbar region and states that the pain does radiate down the back of her leg to her mid calf. The discomfort is worse in the morning, occurs just about every morning now. It eases as the day goes on, but is always present.  Prior note 02/17/2017: Pt presents for an OV with complaints of back pain  of today duration.  Associated symptoms include lumbar back pain, with radiation to bilateral calves after picking up a box at work. She works at [employer] and felt immediate discomfort after picking up a box. She has had similar discomfort in the past affecting only her left SI and sciatica. She reports she performs stretches before work and typically never has an issue with lifting. She doe snot wear a back brace at work. She has tried taking a dose of naprosyn today, without much relief. She denies bladder or bowel dysfunction. The pain is worse with bending forward.  Depression screen PHQ 2/9 07/27/2016  Decreased Interest 0  Down, Depressed, Hopeless 0  PHQ - 2 Score 0    No Known Allergies Social History  Substance Use Topics  . Smoking status: Never Smoker  . Smokeless tobacco: Never Used  . Alcohol use No   Past Medical History:  Diagnosis Date  . Vertigo    Past Surgical History:  Procedure Laterality  Date  . BREAST REDUCTION SURGERY  2015   Family History  Problem Relation Age of Onset  . Testicular cancer Father   . Uterine cancer Maternal Aunt    Allergies as of 03/09/2017   No Known Allergies     Medication List       Accurate as of 03/09/17 10:34 AM. Always use your most recent med list.          cyclobenzaprine 5 MG tablet Commonly known as:  FLEXERIL Take 1 tablet (5 mg total) by mouth 3 (three) times daily as needed for muscle spasms.   naproxen 500 MG tablet Commonly known as:  NAPROSYN Take 1 tablet (500 mg total) by mouth 2 (two) times daily with a meal.   norethindrone 0.35 MG tablet Commonly known as:  ORTHO MICRONOR Take 1 tablet (0.35 mg total) by mouth daily.            Discharge Care Instructions        Start     Ordered   03/09/17 0000  Flu Vaccine QUAD 6+ mos PF IM (Fluarix Quad PF)     03/09/17 1029      All past medical history, surgical history, allergies, family history, immunizations andmedications were updated in the EMR today and reviewed under the history and medication portions of their EMR.     ROS: Negative, with the exception of  above mentioned in HPI   Objective:  BP 96/64 (BP Location: Left Arm, Patient Position: Sitting, Cuff Size: Large)   Pulse 76   Temp 98.4 F (36.9 C)   Resp 20   Wt 182 lb (82.6 kg)   SpO2 97%   BMI 27.67 kg/m  Body mass index is 27.67 kg/m. Gen: Afebrile. No acute distress. Nontoxic in appearance, well-developed, well-nourished. MSK: No erythema, no soft tissue swelling, no bruising. Discomfort with left side bending of lumbar spine in flexion. Full range of motion bilateral lower extremities. No lumbar spine tenderness. No tenderness paraspinal muscles. Mild tenderness over right piriformis. Negative Fabre bilaterally. Muscle skeletal strength 5/5 bilateral lower extremities. DTRs equal bilaterally. Neurovascularly intact distally. Neuro: Normal gait. PERLA. EOMi. Alert. Oriented x3  No exam  data present No results found. No results found for this or any previous visit (from the past 24 hour(s)).  Assessment/Plan: Cherice Glennie is a 21 y.o. female present for OV for  Lumbar pain with radiation down the right leg - Patient's exam today is rather consistent with sciatica. She has had sciatica in the past, and agrees this feels the same. She initially got great relief, but stopped the medications and did not perform stretches. Urged her to start the stretches, sciatica rehabilitation stretches provided to her today. Restart naproxen twice a day for 7 days, can use the Flexeril if needed only. She still has prescription of Flexeril left over. - Continue heat therapy. Avoid heavy lifting if possible.  - Again, encouraged use of back brace at work, as well as continue stretches.  - follow up 2-4 weeks if not improving or worsening. Next Pap would be formal physical therapy or OMT referral. She will call in 2 weeks if not having improvement and would be happy to place a referral to either depending upon her preference.   Influenza shot administered today.   Reviewed expectations re: course of current medical issues.  Discussed self-management of symptoms.  Outlined signs and symptoms indicating need for more acute intervention.  Patient verbalized understanding and all questions were answered.  Patient received an After-Visit Summary.    Orders Placed This Encounter  Procedures  . Flu Vaccine QUAD 6+ mos PF IM (Fluarix Quad PF)     Note is dictated utilizing voice recognition software. Although note has been proof read prior to signing, occasional typographical errors still can be missed. If any questions arise, please do not hesitate to call for verification.   electronically signed by:  Howard Pouch, DO  [place]

## 2017-04-10 DIAGNOSIS — Z79899 Other long term (current) drug therapy: Secondary | ICD-10-CM | POA: Diagnosis not present

## 2017-04-10 DIAGNOSIS — L700 Acne vulgaris: Secondary | ICD-10-CM | POA: Diagnosis not present

## 2017-04-11 ENCOUNTER — Other Ambulatory Visit: Payer: Self-pay

## 2017-04-11 DIAGNOSIS — M79605 Pain in left leg: Secondary | ICD-10-CM

## 2017-04-11 DIAGNOSIS — M545 Low back pain: Secondary | ICD-10-CM

## 2017-04-12 NOTE — Telephone Encounter (Signed)
Patient notified and verbalized understanding. 

## 2017-05-15 ENCOUNTER — Other Ambulatory Visit: Payer: Self-pay | Admitting: Family Medicine

## 2017-05-15 DIAGNOSIS — Z3009 Encounter for other general counseling and advice on contraception: Secondary | ICD-10-CM

## 2017-05-15 DIAGNOSIS — Z79899 Other long term (current) drug therapy: Secondary | ICD-10-CM | POA: Diagnosis not present

## 2017-05-15 DIAGNOSIS — L700 Acne vulgaris: Secondary | ICD-10-CM | POA: Diagnosis not present

## 2017-06-15 DIAGNOSIS — Z79899 Other long term (current) drug therapy: Secondary | ICD-10-CM | POA: Diagnosis not present

## 2017-07-19 DIAGNOSIS — H20022 Recurrent acute iridocyclitis, left eye: Secondary | ICD-10-CM | POA: Diagnosis not present

## 2017-07-25 ENCOUNTER — Other Ambulatory Visit: Payer: Self-pay | Admitting: Family Medicine

## 2017-07-25 ENCOUNTER — Encounter: Payer: Self-pay | Admitting: *Deleted

## 2017-07-25 DIAGNOSIS — Z3009 Encounter for other general counseling and advice on contraception: Secondary | ICD-10-CM

## 2017-08-07 DIAGNOSIS — L700 Acne vulgaris: Secondary | ICD-10-CM | POA: Diagnosis not present

## 2017-08-08 DIAGNOSIS — L700 Acne vulgaris: Secondary | ICD-10-CM | POA: Diagnosis not present

## 2017-08-08 DIAGNOSIS — Z79899 Other long term (current) drug therapy: Secondary | ICD-10-CM | POA: Diagnosis not present

## 2017-08-09 DIAGNOSIS — H20022 Recurrent acute iridocyclitis, left eye: Secondary | ICD-10-CM | POA: Diagnosis not present

## 2017-08-09 LAB — HEPATIC FUNCTION PANEL
ALT: 14 (ref 7–35)
AST: 13 (ref 13–35)
Alkaline Phosphatase: 105 (ref 25–125)
BILIRUBIN DIRECT: 0.14 (ref 0.01–0.4)
BILIRUBIN, TOTAL: 0.4

## 2017-08-09 LAB — LIPID PANEL: TRIGLYCERIDES: 41 (ref 40–160)

## 2017-09-04 ENCOUNTER — Other Ambulatory Visit: Payer: Self-pay | Admitting: *Deleted

## 2017-09-04 DIAGNOSIS — Z3009 Encounter for other general counseling and advice on contraception: Secondary | ICD-10-CM

## 2017-09-04 MED ORDER — NORETHINDRONE 0.35 MG PO TABS: 1.0000 | ORAL_TABLET | Freq: Every day | ORAL | 0 refills | Status: DC

## 2017-09-05 ENCOUNTER — Encounter: Payer: Self-pay | Admitting: Family Medicine

## 2017-09-13 ENCOUNTER — Other Ambulatory Visit (HOSPITAL_COMMUNITY)
Admission: RE | Admit: 2017-09-13 | Discharge: 2017-09-13 | Disposition: A | Discharge status: Home / Self Care | Payer: 59 | Source: Ambulatory Visit | Attending: Family Medicine | Admitting: Family Medicine

## 2017-09-13 ENCOUNTER — Encounter: Payer: Self-pay | Admitting: Family Medicine

## 2017-09-13 ENCOUNTER — Ambulatory Visit (INDEPENDENT_AMBULATORY_CARE_PROVIDER_SITE_OTHER): Payer: 59 | Admitting: Family Medicine

## 2017-09-13 VITALS — BP 94/65 | HR 78 | Temp 98.6°F | Resp 20 | Ht 68.0 in | Wt 187.0 lb

## 2017-09-13 DIAGNOSIS — Z01419 Encounter for gynecological examination (general) (routine) without abnormal findings: Secondary | ICD-10-CM | POA: Insufficient documentation

## 2017-09-13 DIAGNOSIS — Z3041 Encounter for surveillance of contraceptive pills: Secondary | ICD-10-CM | POA: Diagnosis not present

## 2017-09-13 DIAGNOSIS — E663 Overweight: Secondary | ICD-10-CM

## 2017-09-13 DIAGNOSIS — Z130 Encounter for screening for diseases of the blood and blood-forming organs and certain disorders involving the immune mechanism: Secondary | ICD-10-CM

## 2017-09-13 DIAGNOSIS — Z79899 Other long term (current) drug therapy: Secondary | ICD-10-CM

## 2017-09-13 DIAGNOSIS — Z124 Encounter for screening for malignant neoplasm of cervix: Secondary | ICD-10-CM | POA: Diagnosis not present

## 2017-09-13 DIAGNOSIS — Z0000 Encounter for general adult medical examination without abnormal findings: Secondary | ICD-10-CM

## 2017-09-13 LAB — LIPID PANEL
Cholesterol: 141 mg/dL (ref 0–200)
HDL: 56.4 mg/dL (ref >39.00)
LDL CALC: 75 mg/dL (ref 0–99)
NONHDL: 84.9
Total CHOL/HDL Ratio: 3
Triglycerides: 50 mg/dL (ref 0.0–149.0)
VLDL: 10 mg/dL (ref 0.0–40.0)

## 2017-09-13 LAB — COMPREHENSIVE METABOLIC PANEL
ALK PHOS: 88 U/L (ref 39–117)
ALT: 10 U/L (ref 0–35)
AST: 15 U/L (ref 0–37)
Albumin: 4.2 g/dL (ref 3.5–5.2)
BUN: 11 mg/dL (ref 6–23)
CO2: 29 meq/L (ref 19–32)
Calcium: 9.6 mg/dL (ref 8.4–10.5)
Chloride: 104 mEq/L (ref 96–112)
Creatinine, Ser: 0.64 mg/dL (ref 0.40–1.20)
GFR: 122.94 mL/min (ref >60.00)
Glucose, Bld: 82 mg/dL (ref 70–99)
POTASSIUM: 4.5 meq/L (ref 3.5–5.1)
SODIUM: 140 meq/L (ref 135–145)
TOTAL PROTEIN: 7.4 g/dL (ref 6.0–8.3)
Total Bilirubin: 0.6 mg/dL (ref 0.2–1.2)

## 2017-09-13 LAB — CBC WITH DIFFERENTIAL/PLATELET
BASOS ABS: 0 10*3/uL (ref 0.0–0.1)
Basophils Relative: 0.8 % (ref 0.0–3.0)
EOS PCT: 3.4 % (ref 0.0–5.0)
Eosinophils Absolute: 0.1 10*3/uL (ref 0.0–0.7)
HCT: 40.4 % (ref 36.0–46.0)
Hemoglobin: 13.6 g/dL (ref 12.0–15.0)
LYMPHS ABS: 1 10*3/uL (ref 0.7–4.0)
Lymphocytes Relative: 22.3 % (ref 12.0–46.0)
MCHC: 33.5 g/dL (ref 30.0–36.0)
MCV: 84.3 fl (ref 78.0–100.0)
MONO ABS: 0.4 10*3/uL (ref 0.1–1.0)
Monocytes Relative: 8.6 % (ref 3.0–12.0)
NEUTROS ABS: 2.8 10*3/uL (ref 1.4–7.7)
Neutrophils Relative %: 64.9 % (ref 43.0–77.0)
PLATELETS: 350 10*3/uL (ref 150.0–400.0)
RBC: 4.79 Mil/uL (ref 3.87–5.11)
RDW: 15 % (ref 11.5–15.5)
WBC: 4.3 10*3/uL (ref 4.0–10.5)

## 2017-09-13 LAB — HEMOGLOBIN A1C: HEMOGLOBIN A1C: 5 % (ref 4.6–6.5)

## 2017-09-13 MED ORDER — NORETHINDRONE 0.35 MG PO TABS: 1.0000 | ORAL_TABLET | Freq: Every day | ORAL | 11 refills | Status: DC

## 2017-09-13 NOTE — Patient Instructions (Signed)

## 2017-09-13 NOTE — Progress Notes (Signed)
Patient ID: Toni Livingston, female  DOB: May 23, 1996, 22 y.o.   MRN: [id number] Patient Care Team    Relationship Specialty Notifications Start End  Ma Hillock, DO PCP - General Family Medicine  07/27/16     Chief Complaint  Patient presents with  . Annual Exam  . Gynecologic Exam    Subjective:  Toni Livingston is a 22 y.o.  Female  present for CPE. All past medical history, surgical history, allergies, family history, immunizations, medications and social history were updated in the electronic medical record today. All recent labs, ED visits and hospitalizations within the last year were reviewed.  Well woman exam: She reports she is doing well.  She is tolerating the birth control pills without side effects.  Her menstrual cycles are now rather routine once a month.  She is sexually active with one female partner.  He denies any vaginal discharge, vaginal odor, vaginal irritation or dyspareunia.  Has had pelvic exams in the past, but she has never had a cervical cancer screening.Patient's last menstrual period was 08/18/2017.  Health maintenance:  Cervical cancer screening: first pap screen.  Immunizations: tdap UTD 2018, Influenza UTD 2018(encouraged yearly) Infectious disease screening: HIV completed. Assistive device: none Oxygen XTA:VWPV Patient has a Dental home. Hospitalizations/ED visits: none  Depression screen [facility] 2/9 09/13/2017 07/27/2016  Decreased Interest 0 0  Down, Depressed, Hopeless 0 0  PHQ - 2 Score 0 0   No flowsheet data found.   Current Exercise Habits: The patient has a physically strenous job, but has no regular exercise apart from work. Exercise limited by: None identified   Immunization History  Administered Date(s) Administered  . Influenza,inj,Quad PF,6+ Mos 03/09/2017  . Influenza-Unspecified 03/20/2016  . PPD Test 07/27/2016  . Tdap 07/27/2016     Past Medical History:  Diagnosis Date  . Vertigo    No Known Allergies Past  Surgical History:  Procedure Laterality Date  . BREAST REDUCTION SURGERY  2015   Family History  Problem Relation Age of Onset  . Testicular cancer Father   . Uterine cancer Maternal Aunt    Social History   Socioeconomic History  . Marital status: Single    Spouse name: Not on file  . Number of children: 0  . Years of education: 82  . Highest education level: Not on file  Occupational History  . Occupation: Consulting civil engineer  Social Needs  . Financial resource strain: Not on file  . Food insecurity:    Worry: Not on file    Inability: Not on file  . Transportation needs:    Medical: Not on file    Non-medical: Not on file  Tobacco Use  . Smoking status: Never Smoker  . Smokeless tobacco: Never Used  Substance and Sexual Activity  . Alcohol use: No  . Drug use: No  . Sexual activity: Yes  Lifestyle  . Physical activity:    Days per week: Not on file    Minutes per session: Not on file  . Stress: Not on file  Relationships  . Social connections:    Talks on phone: Not on file    Gets together: Not on file    Attends religious service: Not on file    Active member of club or organization: Not on file    Attends meetings of clubs or organizations: Not on file    Relationship status: Not on file  . Intimate partner violence:    Fear of current or ex  partner: Not on file    Emotionally abused: Not on file    Physically abused: Not on file    Forced sexual activity: Not on file  Other Topics Concern  . Not on file  Social History Narrative   Single. Some college.    Works as Therapist, nutritional.    Drinks caffeine.    Wears seatbelt. Smoke detector in the home.    Exercises routinely.    Feels safe in relationships.       Allergies as of 09/13/2017   No Known Allergies     Medication List        Accurate as of 09/13/17 11:58 AM. Always use your most recent med list.          norethindrone 0.35 MG tablet Commonly known as:   MICRONOR,CAMILA,ERRIN Take 1 tablet (0.35 mg total) by mouth daily. Needs office visit prior to anymore refills       All past medical history, surgical history, allergies, family history, immunizations andmedications were updated in the EMR today and reviewed under the history and medication portions of their EMR.     ROS: 14 pt review of systems performed and negative (unless mentioned in an HPI)  Objective: BP 94/65 (BP Location: Left Arm, Patient Position: Sitting, Cuff Size: Large)   Pulse 78   Temp 98.6 F (37 C)   Resp 20   Ht 5' 8"  (1.727 m)   Wt 187 lb (84.8 kg)   LMP 08/18/2017   SpO2 98%   BMI 28.43 kg/m  Gen: Afebrile. No acute distress. Nontoxic in appearance, well-developed, well-nourished, very overweight, pleasant Caucasian female. HENT: AT. [place]. Bilateral TM visualized and normal in appearance, normal external auditory canal. MMM, no oral lesions, adequate dentition. Bilateral nares within normal limits. Throat without erythema, ulcerations or exudates.  No cough on exam, no hoarseness on exam. Eyes:Pupils Equal Round Reactive to light, Extraocular movements intact,  Conjunctiva without redness, discharge or icterus. Neck/lymp/endocrine: Supple, no lymphadenopathy, no thyromegaly CV: RRR no murmur, no edema, +2/4 P posterior tibialis pulses.  No carotid bruits. No JVD. Chest: CTAB, no wheeze, rhonchi or crackles.  Normal respiratory effort.  Good air movement. Abd: Soft.  Flat. NTND. BS present.  No masses palpated. No hepatosplenomegaly. No rebound tenderness or guarding. Skin: No rashes, purpura or petechiae. Warm and well-perfused. Skin intact. Neuro/Msk:  Normal gait. PERLA. EOMi. Alert. Oriented x3.  Cranial nerves II through XII intact. Muscle strength 5/5 upper/lower extremity. DTRs equal bilaterally. Psych: Normal affect, dress and demeanor. Normal speech. Normal thought content and judgment. Breasts: breasts appear normal, symmetrical, no tenderness on exam,  no suspicious masses, no skin or nipple changes or axillary nodes. GYN:  External genitalia within normal limits, normal hair distribution, no lesions. Urethral meatus normal, no lesions. Vaginal mucosa pink, moist, normal rugae, no lesions. No cystocele or rectocele. cervix without lesions, no discharge. Bimanual exam revealed normal uterus.  No bladder/suprapubic fullness, masses or tenderness. No cervical motion tenderness. No adnexal fullness. Anus and perineum within normal limits, no lesions.  No exam data present  Assessment/plan: Toni Livingston is a 22 y.o. female present for CPE with Pap. Screening for cervical cancer - Cytology - PAP Screening for iron deficiency anemia - CBC w/Diff Overweight (BMI 25.0-29.9) Diet and exercise education provided - Lipid panel Encounter for long-term current use of medication/Oral contraceptive use - bcp: refills provided today, PAP completed - Comp Met (CMET) - HgB A1c - norethindrone (MICRONOR,CAMILA,ERRIN) 0.35 MG tablet; Take 1  tablet (0.35 mg total) by mouth daily. Needs office visit prior to anymore refills  Dispense: 1 Package; Refill: 11 Encounter for well woman exam with routine gynecological exam/Encounter for preventive health examination Patient was encouraged to exercise greater than 150 minutes a week. Patient was encouraged to choose a diet filled with fresh fruits and vegetables, and lean meats. AVS provided to patient today for education/recommendation on gender specific health and safety maintenance. Cervical cancer screening: first pap screen.  Immunizations: tdap UTD 2018, Influenza UTD 2018(encouraged yearly) Infectious disease screening: HIV completed.   Return in about 1 year (around 09/14/2018) for CPE.  Electronically signed by: Howard Pouch, DO [place]

## 2017-09-15 ENCOUNTER — Telehealth: Payer: Self-pay | Admitting: Family Medicine

## 2017-09-15 ENCOUNTER — Encounter: Payer: Self-pay | Admitting: Family Medicine

## 2017-09-15 LAB — CYTOLOGY - PAP

## 2017-09-15 NOTE — Telephone Encounter (Signed)
All labs are normal with the exception of her PAP test.  - her pap result is low grade changes "LSIL"-  means that the cervical cells show changes that are mildly abnormal. LSIL usually is caused by an HPV infection that often goes away on its own. - however she will need repeat PAP test in 12 months to ensure resolution and not advancing to more serious condition.

## 2017-09-15 NOTE — Telephone Encounter (Signed)
Please call pt back concerning pap results she received today.  LSIL is abnormal cells only. Her particular cause is unknown and would not be a concern unless PAP in 12 months has not cleared the LSIL. Most LSIL are from HPV (which is sexually transmitted), but it resolves ... meaning the body clears it.  - its presence right now just means we have to complete a PAP in 12 months instead of 2-3 years to make sure it is cleared. Condom use/sfae sex is always encouraged.   - her results will be released to Mychart. It does take a few days though.

## 2017-09-15 NOTE — Telephone Encounter (Signed)
Patient notified and verbalized understanding. 

## 2017-09-18 NOTE — Telephone Encounter (Signed)
Spoke with patient reviewed PAP results and instructions. Patient verbalized understanding.

## 2018-01-11 DIAGNOSIS — M9903 Segmental and somatic dysfunction of lumbar region: Secondary | ICD-10-CM | POA: Diagnosis not present

## 2018-01-11 DIAGNOSIS — M5432 Sciatica, left side: Secondary | ICD-10-CM | POA: Diagnosis not present

## 2018-01-17 DIAGNOSIS — M9903 Segmental and somatic dysfunction of lumbar region: Secondary | ICD-10-CM | POA: Diagnosis not present

## 2018-01-17 DIAGNOSIS — M5432 Sciatica, left side: Secondary | ICD-10-CM | POA: Diagnosis not present

## 2018-01-31 DIAGNOSIS — M5432 Sciatica, left side: Secondary | ICD-10-CM | POA: Diagnosis not present

## 2018-01-31 DIAGNOSIS — M9903 Segmental and somatic dysfunction of lumbar region: Secondary | ICD-10-CM | POA: Diagnosis not present

## 2018-02-28 DIAGNOSIS — M9903 Segmental and somatic dysfunction of lumbar region: Secondary | ICD-10-CM | POA: Diagnosis not present

## 2018-02-28 DIAGNOSIS — M5432 Sciatica, left side: Secondary | ICD-10-CM | POA: Diagnosis not present

## 2018-03-05 ENCOUNTER — Encounter: Payer: Self-pay | Admitting: Family Medicine

## 2018-03-05 ENCOUNTER — Encounter: Payer: Self-pay | Admitting: *Deleted

## 2018-03-05 ENCOUNTER — Ambulatory Visit: Payer: 59 | Admitting: Family Medicine

## 2018-03-05 VITALS — BP 95/57 | HR 60 | Temp 98.6°F | Resp 16 | Ht 68.0 in | Wt 195.0 lb

## 2018-03-05 DIAGNOSIS — Z23 Encounter for immunization: Secondary | ICD-10-CM

## 2018-03-05 DIAGNOSIS — H9311 Tinnitus, right ear: Secondary | ICD-10-CM | POA: Diagnosis not present

## 2018-03-05 DIAGNOSIS — R42 Dizziness and giddiness: Secondary | ICD-10-CM | POA: Diagnosis not present

## 2018-03-05 DIAGNOSIS — R110 Nausea: Secondary | ICD-10-CM | POA: Diagnosis not present

## 2018-03-05 MED ORDER — PROMETHAZINE HCL 12.5 MG PO TABS: ORAL_TABLET | ORAL | 0 refills | Status: DC

## 2018-03-05 NOTE — Addendum Note (Signed)
Addended by: Onalee Hua on: 03/05/2018 12:16 PM   Modules accepted: Orders

## 2018-03-05 NOTE — Progress Notes (Signed)
OFFICE VISIT  03/05/2018   CC:  Chief Complaint  Patient presents with  . Dizziness   HPI:    Patient is a 22 y.o.  female who presents for "vertigo". Onset 3 d/a for onset of ears ringing, mainly R--with hearing deficit and it felt"fluidy like when allergies are affecting it".  When she rolls over and/or stands up she gets abrupt onset of sensation of room spinning that lasts for 1 min.  Gets nauseated, ringing in R ear and imp hearing in R has continued with each episode except it is gone today.  Tries to sit still but any trigger movement made it occur.  Last 24h a bit better than preceding 2d.  Had some diarrhea x 1d after eating something different.  No fevers.  No preceding head trauma. She had onset of these episodic sx's around age 55yr, occurs about 1-2 times per year.  Last 1-2 days longer the last couple of episodes.   No home epley maneuvers tried this time. Her last URI was over a month ago.  No abd pain or GI illness.    Past Medical History:  Diagnosis Date  . Vertigo     Past Surgical History:  Procedure Laterality Date  . BREAST REDUCTION SURGERY  2015    Outpatient Medications Prior to Visit  Medication Sig Dispense Refill  . norethindrone (MICRONOR,CAMILA,ERRIN) 0.35 MG tablet Take 1 tablet (0.35 mg total) by mouth daily. Needs office visit prior to anymore refills 1 Package 11   No facility-administered medications prior to visit.     No Known Allergies  ROS As per HPI  PE: Blood pressure (!) 95/57, pulse 60, temperature 98.6 F (37 C), temperature source Oral, resp. rate 16, height 5\' 8"  (1.727 m), weight 195 lb (88.5 kg), last menstrual period 02/13/2018, SpO2 98 %.  Pt examined with Helayne Seminole, CMA, as chaperone.  Orthostatics: lying 122/74, 67.  Upright--121/74, 80.  Standing: 118/81, P 75 Gen: Alert, well appearing.  Patient is oriented to person, place, time, and situation. AFFECT: pleasant, lucid thought and speech. Neuro: CN 2-12  intact bilaterally, strength 5/5 in proximal and distal upper extremities and lower extremities bilaterally. No tremor.  No disdiadochokinesis.  No ataxia.  No pronator drift. Dix [place]pike: head to L, minimal light headed feeling upon lying supine, but when sitting back upright she got 10-15 sec of mild vertigo but no nystagmus. Head to R, essentially the same result--no nystagmus.   LABS:  None today  IMPRESSION AND PLAN:  Vertigo: positional trigger favors BPPV. However, unilateral tinnitus and hearing loss R ear suggest possible meniere's dz. Pt declined referral to ENT for expert opinion, which I think is reasonable. We'll hold off on meclizine since it is not helping this time, and I'll have her take promethazine 12.5-25mg  q6h prn. Low sodium diet info reviewed/handout given.    An After Visit Summary was printed and given to the patient.  FOLLOW UP: Return if symptoms worsen or fail to improve.  Signed:  Crissie Sickles, MD           03/05/2018

## 2018-04-03 ENCOUNTER — Encounter: Payer: Self-pay | Admitting: Family Medicine

## 2018-04-03 ENCOUNTER — Ambulatory Visit: Payer: 59 | Admitting: Family Medicine

## 2018-04-03 VITALS — BP 110/72 | HR 72 | Resp 16 | Ht 68.0 in | Wt 200.0 lb

## 2018-04-03 DIAGNOSIS — S39012A Strain of muscle, fascia and tendon of lower back, initial encounter: Secondary | ICD-10-CM | POA: Diagnosis not present

## 2018-04-03 MED ORDER — NAPROXEN 500 MG PO TABS: 500.0000 mg | ORAL_TABLET | Freq: Two times a day (BID) | ORAL | 0 refills | Status: DC

## 2018-04-03 MED ORDER — CYCLOBENZAPRINE HCL 5 MG PO TABS: 5.0000 mg | ORAL_TABLET | Freq: Three times a day (TID) | ORAL | 1 refills | Status: DC | PRN

## 2018-04-03 NOTE — Progress Notes (Signed)
Toni Livingston , February 03, 1996, 22 y.o., female MRN: [id number] Patient Care Team    Relationship Specialty Notifications Start End  Ma Hillock, DO PCP - General Family Medicine  07/27/16     Chief Complaint  Patient presents with  . Back Pain    follow up     Subjective:  Toni Livingston is a 22 y.o. female presents today for recurrent lower back pain.  She reports over the last year she has been going to a chiropractor and working on strengthening her lower back at the gym.  This has been going well for her.  He went hiking last weekend and reports she must have just stood up from, upon standing she had a sharp pain in her lower back that initially radiated on both of her lower extremities.  Now it is mostly radiating down to her right calf.  She denies any bladder or bowel dysfunction.  Has been trying over-the-counter NSAIDs. Prior note:  Pt presents for an OV with complaints of back pain  of today duration.  Associated symptoms include lumbar back pain, with radiation to bilateral calves after picking up a box at work. She works at [employer] and felt immediate discomfort after picking up a box. She has had similar discomfort in the past affecting only her left SI and sciatica. She reports she performs stretches before work and typically never has an issue with lifting. She doe snot wear a back brace at work. She has tried taking a dose of naprosyn today, without much relief. She denies bladder or bowel dysfunction. The pain is worse with bending forward.  Depression screen [facility] 2/9 09/13/2017 07/27/2016  Decreased Interest 0 0  Down, Depressed, Hopeless 0 0  PHQ - 2 Score 0 0    No Known Allergies Social History   Tobacco Use  . Smoking status: Never Smoker  . Smokeless tobacco: Never Used  Substance Use Topics  . Alcohol use: No   Past Medical History:  Diagnosis Date  . Vertigo    Past Surgical History:  Procedure Laterality Date  . BREAST REDUCTION SURGERY  2015     Family History  Problem Relation Age of Onset  . Testicular cancer Father   . Uterine cancer Maternal Aunt    Allergies as of 04/03/2018   No Known Allergies     Medication List        Accurate as of 04/03/18  2:56 PM. Always use your most recent med list.          norethindrone 0.35 MG tablet Commonly known as:  MICRONOR,CAMILA,ERRIN Take 1 tablet (0.35 mg total) by mouth daily. Needs office visit prior to anymore refills   promethazine 12.5 MG tablet Commonly known as:  PHENERGAN 1-2 tabs po q6h prn dizziness and nausea       All past medical history, surgical history, allergies, family history, immunizations andmedications were updated in the EMR today and reviewed under the history and medication portions of their EMR.     ROS: Negative, with the exception of above mentioned in HPI   Objective:  BP 110/72 (BP Location: Right Arm, Patient Position: Sitting, Cuff Size: Large)   Pulse 72   Resp 16   Ht 5\' 8"  (1.727 m)   Wt 200 lb (90.7 kg)   SpO2 97%   BMI 30.41 kg/m  Body mass index is 30.41 kg/m.  Gen: Afebrile. No acute distress.  Non-Toxic and presentation, well-developed, well-nourished, Caucasian female. HENT: AT.  [place].  MMM.  Eyes:Pupils Equal Round Reactive to light, Extraocular movements intact,  Conjunctiva without redness, discharge or icterus. MSK: No erythema, no soft tissue swelling of lower lumbar spine.  No bony tenderness to lumbar spine.  No tenderness to palpation sacrum or bilateral SI.  Full range of motion present with discomfort on left side bending, flexion.  Positive FABRE right. Neg SLR bilateral. NV intact distally. Skin: no rashes, purpura or petechiae.  Neuro: mildly guarded gait. PERLA. EOMi. Alert. Oriented x3  Muscle strength 5/5 bilateral lower extremity with discomfort on right extension and right hip flexion.  DTRs equal bilaterally. Psych: Normal affect, dress and demeanor. Normal speech. Normal thought content and judgment.    No exam data present No results found. No results found for this or any previous visit (from the past 24 hour(s)).  Assessment/Plan: Toni Livingston is a 22 y.o. female present for OV for  Lumbar pain with radiation down both legs -Current symptoms of lower lumbar discomfort with radiation to her right extremity.  She responded well to naproxen twice daily x7 days, IM Depo-Medrol injection, Flexeril and rest this occurred a year and a half ago.  Provide the same course this time.  Medications prescribed.  IM steroid injection provided.  Start stretches in 1 week. -Follow-up 4 weeks if no improvement, sooner if worsening.  Reviewed expectations re: course of current medical issues.  Discussed self-management of symptoms.  Outlined signs and symptoms indicating need for more acute intervention.  Patient verbalized understanding and all questions were answered.  Patient received an After-Visit Summary.    No orders of the defined types were placed in this encounter.    Note is dictated utilizing voice recognition software. Although note has been proof read prior to signing, occasional typographical errors still can be missed. If any questions arise, please do not hesitate to call for verification.   electronically signed by:  Howard Pouch, DO  [place]

## 2018-04-03 NOTE — Patient Instructions (Signed)
Start naproxen every 12 hours with food for 7 days. Then as needed for pain.  Use flexeril at night and up to every 8 hours if needed (watch for sedation).  Steroid shot provided today.  Start stretches in 1 week.  Follow up in 4 weeks if not improved, sooner if worsening.     Acute Pain, Adult Acute pain is a type of pain that may last for just a few days or as long as six months. It is often related to an illness, injury, or medical procedure. Acute pain may be mild, moderate, or severe. It usually goes away once your injury has healed or you are no longer ill. Pain can make it hard for you to do daily activities. It can cause anxiety and lead to other problems if left untreated. Treatment depends on the cause and severity of your acute pain. Follow these instructions at home:  Check your pain level as told by your health care provider.  Take over-the-counter and prescription medicines only as told by your health care provider.  If you are taking prescription pain medicine: ? Ask your health care provider about taking a stool softener or laxative to prevent constipation. ? Do not stop taking the medicine suddenly. Talk to your health care provider about how and when to discontinue prescription pain medicine. ? If your pain is severe, do not take more pills than instructed by your health care provider. ? Do not take other over-the-counter pain medicines in addition to this medicine unless told by your health care provider. ? Do not drive or operate heavy machinery while taking prescription pain medicine.  Apply ice or heat as told by your health care provider. These may reduce swelling and pain.  Ask your health care provider if other strategies such as distraction, relaxation, or physical therapies can help your pain.  Keep all follow-up visits as told by your health care provider. This is important. Contact a health care provider if:  You have pain that is not controlled by  medicine.  Your pain does not improve or gets worse.  You have side effects from pain medicines, such as vomitingor confusion. Get help right away if:  You have severe pain.  You have trouble breathing.  You lose consciousness.  You have chest pain or pressure that lasts for more than a few minutes. Along with the chest pain you may: ? Have pain or discomfort in one or both arms, your back, neck, jaw, or stomach. ? Have shortness of breath. ? Break out in a cold sweat. ? Feel nauseous. ? Become light-headed. These symptoms may represent a serious problem that is an emergency. Do not wait to see if the symptoms will go away. Get medical help right away. Call your local emergency services (911 in the [place] Do not drive yourself to the hospital. This information is not intended to replace advice given to you by your health care provider. Make sure you discuss any questions you have with your health care provider. Document Released: 06/21/2015 Document Revised: 11/13/2015 Document Reviewed: 06/21/2015 Elsevier Interactive Patient Education  [employer].

## 2018-04-04 ENCOUNTER — Encounter: Payer: Self-pay | Admitting: Family Medicine

## 2018-08-31 ENCOUNTER — Telehealth: Payer: Self-pay | Admitting: *Deleted

## 2018-08-31 DIAGNOSIS — Z3041 Encounter for surveillance of contraceptive pills: Secondary | ICD-10-CM

## 2018-08-31 MED ORDER — NORETHINDRONE 0.35 MG PO TABS: 1.0000 | ORAL_TABLET | Freq: Every day | ORAL | 0 refills | Status: DC

## 2018-09-03 NOTE — Telephone Encounter (Signed)
My chart message read.

## 2018-09-11 ENCOUNTER — Telehealth: Payer: Self-pay

## 2018-09-11 ENCOUNTER — Encounter: Payer: 59 | Admitting: Family Medicine

## 2018-09-11 DIAGNOSIS — Z3041 Encounter for surveillance of contraceptive pills: Secondary | ICD-10-CM

## 2018-09-11 MED ORDER — NORETHINDRONE 0.35 MG PO TABS: 1.0000 | ORAL_TABLET | Freq: Every day | ORAL | 0 refills | Status: DC

## 2018-09-11 NOTE — Telephone Encounter (Signed)
Called pt about morning time appt and pt verbalized she did not want to come in if she did not have to come in due to the COVID 19. She agreed to reschedule appt for about 4 weeks to have pap done. She did need BC refilled until that appt. Pt wanted to call back to schedule that appt.

## 2018-10-08 ENCOUNTER — Other Ambulatory Visit: Payer: Self-pay

## 2018-10-08 DIAGNOSIS — Z3041 Encounter for surveillance of contraceptive pills: Secondary | ICD-10-CM

## 2018-10-08 MED ORDER — NORETHINDRONE 0.35 MG PO TABS: 1.0000 | ORAL_TABLET | Freq: Every day | ORAL | 0 refills | Status: DC

## 2018-10-08 NOTE — Telephone Encounter (Signed)
Will extend for another month. Please schedule her CPE PAP at the end of May.

## 2018-10-08 NOTE — Telephone Encounter (Signed)
Faxed refill request for patients Birth control,  Norlyda 0.35 mg tablets  LOV: 04/03/2018 (back pain), LOV for medication requested 09/13/2017 Next ov: Not scheduled   Last written: 09/11/2018  Pt was told last refill that she would need to call and schedule CPE/Pap smear visit for the end of April due to [place] 19. Would you like for her to be scheduled for just a virtual visit at this time?  Please advise

## 2018-10-09 NOTE — Telephone Encounter (Signed)
Called and scheduled patient for CPE at the end of May with Pap. Pt agreeable and verbalized understanding

## 2018-10-30 ENCOUNTER — Encounter: Payer: Self-pay | Admitting: Family Medicine

## 2018-10-30 NOTE — Telephone Encounter (Signed)
Pt my chart message: Hello, last night I got something in my eye and might still have it stuck in my left eye or scratched pretty good. It's on the white part of the eye. I spent multiple attempted rinsing my eye out and using eye drops to try and flush it out but still hurts and feels like something is there. Should I come in and have it looked at or is there something else I should do?  I put in a picture if that helps. The area that's hurting is the yellowish tinted spot and my upper eyelid on that side when I blink.  Thank you.  Please advise if you would like in OV, [email or web address], etc.  Thanks

## 2018-11-14 ENCOUNTER — Other Ambulatory Visit (HOSPITAL_COMMUNITY)
Admission: RE | Admit: 2018-11-14 | Discharge: 2018-11-14 | Disposition: A | Discharge status: Home / Self Care | Payer: 59 | Source: Ambulatory Visit | Attending: Family Medicine | Admitting: Family Medicine

## 2018-11-14 ENCOUNTER — Encounter: Payer: Self-pay | Admitting: Family Medicine

## 2018-11-14 ENCOUNTER — Other Ambulatory Visit: Payer: Self-pay

## 2018-11-14 ENCOUNTER — Ambulatory Visit (INDEPENDENT_AMBULATORY_CARE_PROVIDER_SITE_OTHER): Payer: 59 | Admitting: Family Medicine

## 2018-11-14 VITALS — BP 111/70 | HR 75 | Temp 98.0°F | Resp 18 | Ht 69.0 in | Wt 217.1 lb

## 2018-11-14 DIAGNOSIS — Z793 Long term (current) use of hormonal contraceptives: Secondary | ICD-10-CM | POA: Diagnosis not present

## 2018-11-14 DIAGNOSIS — E669 Obesity, unspecified: Secondary | ICD-10-CM | POA: Insufficient documentation

## 2018-11-14 DIAGNOSIS — Z01419 Encounter for gynecological examination (general) (routine) without abnormal findings: Secondary | ICD-10-CM

## 2018-11-14 DIAGNOSIS — N926 Irregular menstruation, unspecified: Secondary | ICD-10-CM | POA: Diagnosis not present

## 2018-11-14 DIAGNOSIS — Z0001 Encounter for general adult medical examination with abnormal findings: Secondary | ICD-10-CM

## 2018-11-14 DIAGNOSIS — R87612 Low grade squamous intraepithelial lesion on cytologic smear of cervix (LGSIL): Secondary | ICD-10-CM | POA: Insufficient documentation

## 2018-11-14 DIAGNOSIS — Z3041 Encounter for surveillance of contraceptive pills: Secondary | ICD-10-CM | POA: Diagnosis not present

## 2018-11-14 DIAGNOSIS — L0591 Pilonidal cyst without abscess: Secondary | ICD-10-CM | POA: Insufficient documentation

## 2018-11-14 HISTORY — DX: Low grade squamous intraepithelial lesion on cytologic smear of cervix (LGSIL): R87.612

## 2018-11-14 LAB — COMPREHENSIVE METABOLIC PANEL
ALT: 14 U/L (ref 0–35)
AST: 15 U/L (ref 0–37)
Albumin: 4.3 g/dL (ref 3.5–5.2)
Alkaline Phosphatase: 104 U/L (ref 39–117)
BUN: 10 mg/dL (ref 6–23)
CO2: 27 mEq/L (ref 19–32)
Calcium: 9.6 mg/dL (ref 8.4–10.5)
Chloride: 104 mEq/L (ref 96–112)
Creatinine, Ser: 0.66 mg/dL (ref 0.40–1.20)
GFR: 110.49 mL/min (ref >60.00)
Glucose, Bld: 68 mg/dL — ABNORMAL LOW (ref 70–99)
Potassium: 4.5 mEq/L (ref 3.5–5.1)
Sodium: 139 mEq/L (ref 135–145)
Total Bilirubin: 0.5 mg/dL (ref 0.2–1.2)
Total Protein: 7.3 g/dL (ref 6.0–8.3)

## 2018-11-14 LAB — CBC
HCT: 41.9 % (ref 36.0–46.0)
Hemoglobin: 14.4 g/dL (ref 12.0–15.0)
MCHC: 34.5 g/dL (ref 30.0–36.0)
MCV: 82.9 fl (ref 78.0–100.0)
Platelets: 368 10*3/uL (ref 150.0–400.0)
RBC: 5.05 Mil/uL (ref 3.87–5.11)
RDW: 14 % (ref 11.5–15.5)
WBC: 5.7 10*3/uL (ref 4.0–10.5)

## 2018-11-14 LAB — HM PAP SMEAR

## 2018-11-14 LAB — TSH: TSH: 1.39 u[IU]/mL (ref 0.35–4.50)

## 2018-11-14 MED ORDER — MUPIROCIN CALCIUM 2 % EX CREA: 1.0000 "application " | TOPICAL_CREAM | Freq: Two times a day (BID) | CUTANEOUS | 0 refills | Status: DC

## 2018-11-14 MED ORDER — NORETHINDRONE 0.35 MG PO TABS: 1.0000 | ORAL_TABLET | Freq: Every day | ORAL | 4 refills | Status: DC

## 2018-11-14 NOTE — Progress Notes (Signed)
Patient ID: Toni Livingston, female  DOB: Oct 05, 1995, 23 y.o.   MRN: [id number] Patient Care Team    Relationship Specialty Notifications Start End  Ma Hillock, DO PCP - General Family Medicine  07/27/16     Chief Complaint  Patient presents with   Annual Exam    Not fasting. Pt has cyst on top buttocks and is bleeding that has been there for years. She would like this looked at. Pap smear due today     Subjective:  Toni Livingston is a 23 y.o.  Female  present for CPE. All past medical history, surgical history, allergies, family history, immunizations, medications and social history were updated in the electronic medical record today. All recent labs, ED visits and hospitalizations within the last year were reviewed.  Well woman exam: She reports she is doing well.  She does have a reoccurring cyst on gluteal cleft.  She has had for many years and a few times a month she notices bloody drainage from the area. She is tolerating the birth control pills without side effects.  Her menstrual cycles are now rather routine once every other month. She is sexually active with one female partner.    She denies vaginal discharge, lesions or dyspareunia.   Her Pap smear 08/2017 was abnormal..Patient's last menstrual period was Patient's last menstrual period was 09/10/2018.  Health maintenance:  Cervical cancer screening: 08/2017-  LGSIL- 12 mos rpt recommended.  Immunizations: tdap UTD 2018, Influenza UTD 2019(encouraged yearly) Infectious disease screening: HIV completed. Assistive device: none Oxygen YWV:PXTG Patient has a Dental home. Hospitalizations/ED visits: none  Depression screen [facility] 2/9 11/14/2018 09/13/2017 07/27/2016  Decreased Interest 0 0 0  Down, Depressed, Hopeless 0 0 0  PHQ - 2 Score 0 0 0   No flowsheet data found.   Immunization History  Administered Date(s) Administered   Influenza,inj,Quad PF,6+ Mos 03/09/2017, 03/05/2018   Influenza-Unspecified 03/20/2016    PPD Test 07/27/2016   Tdap 07/27/2016     Past Medical History:  Diagnosis Date   Vertigo    No Known Allergies Past Surgical History:  Procedure Laterality Date   BREAST REDUCTION SURGERY  2015   Family History  Problem Relation Age of Onset   Testicular cancer Father    Uterine cancer Maternal Aunt    Social History   Social History Narrative   Single. Some college.    Works as Therapist, nutritional.    Drinks caffeine.    Wears seatbelt. Smoke detector in the home.    Exercises routinely.    Feels safe in relationships.        Allergies as of 11/14/2018   No Known Allergies     Medication List       Accurate as of Nov 14, 2018  1:04 PM. If you have any questions, ask your nurse or doctor.        STOP taking these medications   cyclobenzaprine 5 MG tablet Commonly known as:  FLEXERIL Stopped by:  Howard Pouch, DO   naproxen 500 MG tablet Commonly known as:  Naprosyn Stopped by:  Howard Pouch, DO   promethazine 12.5 MG tablet Commonly known as:  PHENERGAN Stopped by:  Howard Pouch, DO     TAKE these medications   mupirocin cream 2 % Commonly known as:  Bactroban Apply 1 application topically 2 (two) times daily. Started by:  Howard Pouch, DO   norethindrone 0.35 MG tablet Commonly known as:  MICRONOR Take 1 tablet (0.35  mg total) by mouth daily. OFFICE VISIT NEEDED       All past medical history, surgical history, allergies, family history, immunizations andmedications were updated in the EMR today and reviewed under the history and medication portions of their EMR.     No results found for this or any previous visit (from the past 2160 hour(s)).  No results found.   ROS: 14 pt review of systems performed and negative (unless mentioned in an HPI)  Objective: BP 111/70 (BP Location: Left Arm, Patient Position: Sitting, Cuff Size: Normal)    Pulse 75    Temp 98 F (36.7 C) (Temporal)    Resp 18    Ht 5\' 9"  (1.753 m)    Wt 217 lb 2 oz  (98.5 kg)    LMP 09/10/2018    SpO2 98%    BMI 32.06 kg/m  Gen: Afebrile. No acute distress. Nontoxic in appearance, well-developed, well-nourished, very pleasant, obese Caucasian female. HENT: AT. [name]. Bilateral TM visualized and normal in appearance, normal external auditory canal. MMM, no oral lesions, adequate dentition. Bilateral nares within normal limits. Throat without erythema, ulcerations or exudates.  No cough on exam, no hoarseness on exam. Eyes:Pupils Equal Round Reactive to light, Extraocular movements intact,  Conjunctiva without redness, discharge or icterus. Neck/lymp/endocrine: Supple, no lymphadenopathy, no thyromegaly CV: RRR no murmur, no edema, +2/4 P posterior tibialis pulses.  No carotid bruits. No JVD. Chest: CTAB, no wheeze, rhonchi or crackles.  Normal respiratory effort.  Good air movement. Abd: Soft.  Obese. NTND. BS present.  No masses palpated. No hepatosplenomegaly. No rebound tenderness or guarding. Skin: No rashes, purpura or petechiae. Warm and well-perfused.  Small pilonidal cyst formation mid gluteal cleft-no erythema or current drainage. Neuro/Msk:  Normal gait. PERLA. EOMi. Alert. Oriented x3.  Cranial nerves II through XII intact. Muscle strength 5/5 upper/lower extremity. DTRs equal bilaterally. Psych: Normal affect, dress and demeanor. Normal speech. Normal thought content and judgment. Breasts: breasts appear normal, symmetrical, no tenderness on exam, no suspicious masses, no skin or nipple changes or axillary nodes. GYN:  External genitalia within normal limits, normal hair distribution, no lesions. Urethral meatus normal, no lesions. Vaginal mucosa pink, moist, normal rugae, no lesions. No cystocele or rectocele. cervix without lesions, no discharge. Bimanual exam revealed normal uterus.  No bladder/suprapubic fullness, masses or tenderness. No cervical motion tenderness. No adnexal fullness. Anus and perineum within normal limits, no lesions.  No exam data  present  Assessment/plan: Toni Livingston is a 23 y.o. female present for CPE. Obesity (BMI 30-39.9) - routine exercise and diet - TSH Oral contraceptive use Refilled BCP for 1 year.  Encounter for well woman exam with routine gynecological exam//LGSIL of cervix of undetermined significance - Cytology - PAP( [employer]) - pt will be called with results.  Long term (current) use of hormonal contraceptives - CBC - Comprehensive metabolic panel - TSH Encounter for routine adult medical exam with abnormal findings Patient was encouraged to exercise greater than 150 minutes a week. Patient was encouraged to choose a diet filled with fresh fruits and vegetables, and lean meats. AVS provided to patient today for education/recommendation on gender specific health and safety maintenance. Cervical cancer screening: 08/2017-  LGSIL- 12 mos rpt recommended. Repeated today Immunizations: tdap UTD 2018, Influenza UTD 2019(encouraged yearly) Infectious disease screening: HIV completed. Pilonidal cyst - chronic- not currently infected. Pt desires surgical removal.  - mupirocin cream (BACTROBAN) 2 %; Apply 1 application topically 2 (two) times daily.  Dispense: 15 g;  Refill: 0 - Ambulatory referral to General Surgery Irregular menses - TSH   Return in about 1 year (around 11/14/2019) for CPE.  Orders Placed This Encounter  Procedures   CBC   Comprehensive metabolic panel   TSH   Ambulatory referral to General Surgery    Referral Priority:   Routine    Referral Type:   Surgical    Referral Reason:   Specialty Services Required    Requested Specialty:   General Surgery    Number of Visits Requested:   1     Electronically signed by: Howard Pouch, DO [place]

## 2018-11-14 NOTE — Patient Instructions (Signed)
Health Maintenance, Female Adopting a healthy lifestyle and getting preventive care can go a long way to promote health and wellness. Talk with your health care provider about what schedule of regular examinations is right for you. This is a good chance for you to check in with your provider about disease prevention and staying healthy. In between checkups, there are plenty of things you can do on your own. Experts have done a lot of research about which lifestyle changes and preventive measures are most likely to keep you healthy. Ask your health care provider for more information. Weight and diet Eat a healthy diet  Be sure to include plenty of vegetables, fruits, low-fat dairy products, and lean protein.  Do not eat a lot of foods high in solid fats, added sugars, or salt.  Get regular exercise. This is one of the most important things you can do for your health. ? Most adults should exercise for at least 150 minutes each week. The exercise should increase your heart rate and make you sweat (moderate-intensity exercise). ? Most adults should also do strengthening exercises at least twice a week. This is in addition to the moderate-intensity exercise. Maintain a healthy weight  Body mass index (BMI) is a measurement that can be used to identify possible weight problems. It estimates body fat based on height and weight. Your health care provider can help determine your BMI and help you achieve or maintain a healthy weight.  For females 20 years of age and older: ? A BMI below 18.5 is considered underweight. ? A BMI of 18.5 to 24.9 is normal. ? A BMI of 25 to 29.9 is considered overweight. ? A BMI of 30 and above is considered obese. Watch levels of cholesterol and blood lipids  You should start having your blood tested for lipids and cholesterol at 24 years of age, then have this test every 5 years.  You may need to have your cholesterol levels checked more often if: ? Your lipid or  cholesterol levels are high. ? You are older than 24 years of age. ? You are at high risk for heart disease. Cancer screening Lung Cancer  Lung cancer screening is recommended for adults 55-80 years old who are at high risk for lung cancer because of a history of smoking.  A yearly low-dose CT scan of the lungs is recommended for people who: ? Currently smoke. ? Have quit within the past 15 years. ? Have at least a 30-pack-year history of smoking. A pack year is smoking an average of one pack of cigarettes a day for 1 year.  Yearly screening should continue until it has been 15 years since you quit.  Yearly screening should stop if you develop a health problem that would prevent you from having lung cancer treatment. Breast Cancer  Practice breast self-awareness. This means understanding how your breasts normally appear and feel.  It also means doing regular breast self-exams. Let your health care provider know about any changes, no matter how small.  If you are in your 20s or 30s, you should have a clinical breast exam (CBE) by a health care provider every 1-3 years as part of a regular health exam.  If you are 40 or older, have a CBE every year. Also consider having a breast X-ray (mammogram) every year.  If you have a family history of breast cancer, talk to your health care provider about genetic screening.  If you are at high risk for breast cancer, talk   to your health care provider about having an MRI and a mammogram every year.  Breast cancer gene (BRCA) assessment is recommended for women who have family members with BRCA-related cancers. BRCA-related cancers include: ? Breast. ? Ovarian. ? Tubal. ? Peritoneal cancers.  Results of the assessment will determine the need for genetic counseling and BRCA1 and BRCA2 testing. Cervical Cancer Your health care provider may recommend that you be screened regularly for cancer of the pelvic organs (ovaries, uterus, and vagina).  This screening involves a pelvic examination, including checking for microscopic changes to the surface of your cervix (Pap test). You may be encouraged to have this screening done every 3 years, beginning at age 21.  For women ages 30-65, health care providers may recommend pelvic exams and Pap testing every 3 years, or they may recommend the Pap and pelvic exam, combined with testing for human papilloma virus (HPV), every 5 years. Some types of HPV increase your risk of cervical cancer. Testing for HPV may also be done on women of any age with unclear Pap test results.  Other health care providers may not recommend any screening for nonpregnant women who are considered low risk for pelvic cancer and who do not have symptoms. Ask your health care provider if a screening pelvic exam is right for you.  If you have had past treatment for cervical cancer or a condition that could lead to cancer, you need Pap tests and screening for cancer for at least 20 years after your treatment. If Pap tests have been discontinued, your risk factors (such as having a new sexual partner) need to be reassessed to determine if screening should resume. Some women have medical problems that increase the chance of getting cervical cancer. In these cases, your health care provider may recommend more frequent screening and Pap tests. Colorectal Cancer  This type of cancer can be detected and often prevented.  Routine colorectal cancer screening usually begins at 24 years of age and continues through 24 years of age.  Your health care provider may recommend screening at an earlier age if you have risk factors for colon cancer.  Your health care provider may also recommend using home test kits to check for hidden blood in the stool.  A small camera at the end of a tube can be used to examine your colon directly (sigmoidoscopy or colonoscopy). This is done to check for the earliest forms of colorectal cancer.  Routine  screening usually begins at age 50.  Direct examination of the colon should be repeated every 5-10 years through 24 years of age. However, you may need to be screened more often if early forms of precancerous polyps or small growths are found. Skin Cancer  Check your skin from head to toe regularly.  Tell your health care provider about any new moles or changes in moles, especially if there is a change in a mole's shape or color.  Also tell your health care provider if you have a mole that is larger than the size of a pencil eraser.  Always use sunscreen. Apply sunscreen liberally and repeatedly throughout the day.  Protect yourself by wearing long sleeves, pants, a wide-brimmed hat, and sunglasses whenever you are outside. Heart disease, diabetes, and high blood pressure  High blood pressure causes heart disease and increases the risk of stroke. High blood pressure is more likely to develop in: ? People who have blood pressure in the high end of the normal range (130-139/85-89 mm Hg). ? People   who are overweight or obese. ? People who are African American.  If you are 84-22 years of age, have your blood pressure checked every 3-5 years. If you are 67 years of age or older, have your blood pressure checked every year. You should have your blood pressure measured twice-once when you are at a hospital or clinic, and once when you are not at a hospital or clinic. Record the average of the two measurements. To check your blood pressure when you are not at a hospital or clinic, you can use: ? An automated blood pressure machine at a pharmacy. ? A home blood pressure monitor.  If you are between 52 years and 3 years old, ask your health care provider if you should take aspirin to prevent strokes.  Have regular diabetes screenings. This involves taking a blood sample to check your fasting blood sugar level. ? If you are at a normal weight and have a low risk for diabetes, have this test once  every three years after 24 years of age. ? If you are overweight and have a high risk for diabetes, consider being tested at a younger age or more often. Preventing infection Hepatitis B  If you have a higher risk for hepatitis B, you should be screened for this virus. You are considered at high risk for hepatitis B if: ? You were born in a country where hepatitis B is common. Ask your health care provider which countries are considered high risk. ? Your parents were born in a high-risk country, and you have not been immunized against hepatitis B (hepatitis B vaccine). ? You have HIV or AIDS. ? You use needles to inject street drugs. ? You live with someone who has hepatitis B. ? You have had sex with someone who has hepatitis B. ? You get hemodialysis treatment. ? You take certain medicines for conditions, including cancer, organ transplantation, and autoimmune conditions. Hepatitis C  Blood testing is recommended for: ? Everyone born from [birth year] through 1966. ? Anyone with known risk factors for hepatitis C. Sexually transmitted infections (STIs)  You should be screened for sexually transmitted infections (STIs) including gonorrhea and chlamydia if: ? You are sexually active and are younger than 24 years of age. ? You are older than 24 years of age and your health care provider tells you that you are at risk for this type of infection. ? Your sexual activity has changed since you were last screened and you are at an increased risk for chlamydia or gonorrhea. Ask your health care provider if you are at risk.  If you do not have HIV, but are at risk, it may be recommended that you take a prescription medicine daily to prevent HIV infection. This is called pre-exposure prophylaxis (PrEP). You are considered at risk if: ? You are sexually active and do not regularly use condoms or know the HIV status of your partner(s). ? You take drugs by injection. ? You are sexually active with a partner  who has HIV. Talk with your health care provider about whether you are at high risk of being infected with HIV. If you choose to begin PrEP, you should first be tested for HIV. You should then be tested every 3 months for as long as you are taking PrEP. Pregnancy  If you are premenopausal and you may become pregnant, ask your health care provider about preconception counseling.  If you may become pregnant, take 400 to 800 micrograms (mcg) of folic acid every  day.  If you want to prevent pregnancy, talk to your health care provider about birth control (contraception). Osteoporosis and menopause  Osteoporosis is a disease in which the bones lose minerals and strength with aging. This can result in serious bone fractures. Your risk for osteoporosis can be identified using a bone density scan.  If you are 1 years of age or older, or if you are at risk for osteoporosis and fractures, ask your health care provider if you should be screened.  Ask your health care provider whether you should take a calcium or vitamin D supplement to lower your risk for osteoporosis.  Menopause may have certain physical symptoms and risks.  Hormone replacement therapy may reduce some of these symptoms and risks. Talk to your health care provider about whether hormone replacement therapy is right for you. Follow these instructions at home:  Schedule regular health, dental, and eye exams.  Stay current with your immunizations.  Do not use any tobacco products including cigarettes, chewing tobacco, or electronic cigarettes.  If you are pregnant, do not drink alcohol.  If you are breastfeeding, limit how much and how often you drink alcohol.  Limit alcohol intake to no more than 1 drink per day for nonpregnant women. One drink equals 12 ounces of beer, 5 ounces of wine, or 1 ounces of hard liquor.  Do not use street drugs.  Do not share needles.  Ask your health care provider for help if you need support  or information about quitting drugs.  Tell your health care provider if you often feel depressed.  Tell your health care provider if you have ever been abused or do not feel safe at home. This information is not intended to replace advice given to you by your health care provider. Make sure you discuss any questions you have with your health care provider. Document Released: 12/20/2010 Document Revised: 11/12/2015 Document Reviewed: 03/10/2015 Elsevier Interactive Patient Education  2019 [employer]     Pilonidal Cyst  A pilonidal cyst is a fluid-filled sac that forms beneath the skin near the tailbone, at the top of the crease of the buttocks (pilonidal area). If the cyst is not large and not infected, it may not cause any problems. If the cyst becomes irritated or infected, it may get larger and fill with pus. An infected cyst is called an abscess. A pilonidal abscess may cause pain and swelling, and it may need to be drained or removed. What are the causes? The cause of this condition is not always known. In some cases, a hair that grows into your skin (ingrown hair) may be the cause. What increases the risk? You are more likely to get a pilonidal cyst if you:  Are female.  Have lots of hair near the crease of the buttocks.  Are overweight.  Have a dimple near the crease of the buttocks.  Wear tight clothing.  Do not bathe or shower often.  Sit for long periods of time. What are the signs or symptoms? Signs and symptoms of a pilonidal cyst may include pain, swelling, redness, and warmth in the pilonidal area. Depending on how big the cyst is, you may be able to feel a lump near your tailbone. If your cyst becomes infected, symptoms may include:  Pus or fluid drainage.  Fever.  Pain, swelling, and redness getting worse.  The lump getting bigger. How is this diagnosed? This condition may be diagnosed based on:  Your symptoms and medical history.  A  physical exam.   A blood test to check for infection.  Testing a pus sample, if applicable. How is this treated? If your cyst does not cause symptoms, you may not need any treatment. If your cyst bothers you or is infected, you may need a procedure to drain or remove the cyst. Depending on the size, location, and severity of your cyst, your health care provider may:  Make an incision in the cyst and drain it (incision and drainage).  Open and drain the cyst, and then stitch the wound so that it stays open while it heals (marsupialization). You will be given instructions about how to care for your open wound while it heals.  Remove all or part of the cyst, and then close the wound (cyst removal). You may need to take antibiotic medicines before your procedure. Follow these instructions at home: Medicines  Take over-the-counter and prescription medicines only as told by your health care provider.  If you were prescribed an antibiotic medicine, take it as told by your health care provider. Do not stop taking the antibiotic even if you start to feel better. General instructions  Keep the area around your pilonidal cyst clean and dry.  If there is fluid or pus draining from your cyst: ? Cover the area with a clean bandage (dressing) as needed. ? Wash the area gently with soap and water. Pat the area dry with a clean towel. Do not rub the area because that may cause bleeding.  Remove hair from the area around the cyst only if your health care provider tells you to do this.  Do not wear tight pants or sit in one position for long periods at a time.  Keep all follow-up visits as told by your health care provider. This is important. Contact a health care provider if you have:  New redness, swelling, or pain.  A fever.  Severe pain. Summary  A pilonidal cyst is a fluid-filled sac that forms beneath the skin near the tailbone, at the top of the crease of the buttocks (pilonidal area).  If the cyst  becomes irritated or infected, it may get larger and fill with pus. An infected cyst is called an abscess.  The cause of this condition is not always known. In some cases, a hair that grows into your skin (ingrown hair) may be the cause.  If your cyst does not cause symptoms, you may not need any treatment. If your cyst bothers you or is infected, you may need a procedure to drain or remove the cyst. This information is not intended to replace advice given to you by your health care provider. Make sure you discuss any questions you have with your health care provider. Document Released: 06/03/2000 Document Revised: 05/25/2017 Document Reviewed: 05/25/2017 Elsevier Interactive Patient Education  2019 [employer].

## 2018-11-16 LAB — CYTOLOGY - PAP
Chlamydia: NEGATIVE
Diagnosis: NEGATIVE
HPV: DETECTED — AB
Neisseria Gonorrhea: NEGATIVE

## 2018-11-19 ENCOUNTER — Telehealth: Payer: Self-pay | Admitting: Family Medicine

## 2018-11-19 DIAGNOSIS — R87612 Low grade squamous intraepithelial lesion on cytologic smear of cervix (LGSIL): Secondary | ICD-10-CM

## 2018-11-19 DIAGNOSIS — R87811 Vaginal high risk human papillomavirus (HPV) DNA test positive: Secondary | ICD-10-CM | POA: Insufficient documentation

## 2018-11-19 HISTORY — DX: Vaginal high risk human papillomavirus (HPV) DNA test positive: R87.811

## 2018-11-19 NOTE — Telephone Encounter (Signed)
Patient was contacted and PAP results as well as HPV education was provided to her.  Her PAP resulted with no abnormality in her cells which is good news. Last PAP 1 year ago LGSIL. However it was  Positive for  high risk HPV. I have referred her to gynecologist to evaluate her and provid further paps and recommendations.

## 2018-11-30 ENCOUNTER — Encounter: Payer: Self-pay | Admitting: Family Medicine

## 2018-12-04 ENCOUNTER — Other Ambulatory Visit: Payer: Self-pay

## 2018-12-05 ENCOUNTER — Ambulatory Visit: Payer: 59 | Admitting: Obstetrics & Gynecology

## 2018-12-05 ENCOUNTER — Encounter: Payer: Self-pay | Admitting: Obstetrics & Gynecology

## 2018-12-05 VITALS — BP 130/70 | Ht 68.0 in | Wt 216.0 lb

## 2018-12-05 DIAGNOSIS — Z30011 Encounter for initial prescription of contraceptive pills: Secondary | ICD-10-CM

## 2018-12-05 DIAGNOSIS — N870 Mild cervical dysplasia: Secondary | ICD-10-CM | POA: Diagnosis not present

## 2018-12-05 DIAGNOSIS — R87810 Cervical high risk human papillomavirus (HPV) DNA test positive: Secondary | ICD-10-CM

## 2018-12-05 DIAGNOSIS — R87612 Low grade squamous intraepithelial lesion on cytologic smear of cervix (LGSIL): Secondary | ICD-10-CM

## 2018-12-05 MED ORDER — LO LOESTRIN FE 1 MG-10 MCG / 10 MCG PO TABS: 1.0000 | ORAL_TABLET | Freq: Every day | ORAL | 4 refills | Status: DC

## 2018-12-05 NOTE — Progress Notes (Signed)
    Toni Livingston Jul 21, 1995 [id number]        23 y.o.  G0 Single.  Stable boyfriend x 2 yrs  RP: HR HPV positive for Colposcopy  HPI: Stable boyfriend for 2 years.  Per patient had a normal Pap test at age 46.  Then a repeat Pap test was done March 2019 which showed LGSIL.  A repeat Pap test was done 11/14/2018 which was negative but high risk HPV was positive.  Gonorrhea and Chlamydia were negative on Nov 14, 2018.  Patient is on the progestin only birth control pill for contraception.  She reports having intolerance to estrogen progestin pills in the past.  No history of DVT or pulmonary embolism.     OB History  Gravida Para Term Preterm AB Living  0 0 0 0 0 0  SAB TAB Ectopic Multiple Live Births  0 0 0 0 0    Past medical history,surgical history, problem list, medications, allergies, family history and social history were all reviewed and documented in the EPIC chart.   Directed ROS with pertinent positives and negatives documented in the history of present illness/assessment and plan.  Exam:  Vitals:   12/05/18 1201  BP: 130/70  Weight: 216 lb (98 kg)  Height: 5\' 8"  (1.727 m)   General appearance:  Normal  Colposcopy Procedure Note Lubertha Leite 12/05/2018  Indications: LGSIL Pap 2019 and HPV HR positive 10/2018  Procedure Details  The risks and benefits of the procedure and Verbal informed consent obtained.  Speculum placed in vagina and excellent visualization of cervix achieved, cervix swabbed x 3 with acetic acid solution.  Findings:  Cervix colposcopy: Physical Exam Genitourinary:       Vaginal colposcopy: Normal  Vulvar colposcopy: Normal  Perirectal colposcopy: Grossly normal  The cervix was sprayed with Hurricane before performing the cervical biopsies.  Specimens: HPV 16-18-45 done.  Cervical Bx at 3 O'Clock.  Complications:  None, good hemostasis with Silver Nitrate . Plan:  Per results   Assessment/Plan:  23 y.o. G0  1.  Papanicolaou smear of cervix with positive high risk human papilloma virus (HPV) test Pap 11/14/2018 Negative with HPV HR positive.  Pap 2019 LGSIL.  Abnormal Pap and HR HPV Dx discussed with patient, management reviewed.  Colposcopy procedure explained.  Well tolerated.  Findings reviewed.  Management per results.  2. Low grade squamous intraepithelial lesion (LGSIL) at risk for high grade squamous intraepithelial lesion (HGSIL) on cytologic smear of cervix Pap in 08/2017 LGSIL  3. Encounter for initial prescription of contraceptive pills Counseling on contraception.  Patient informed of the higher risk of contraception failure with the Progestin-only pill with 95% efficacy rather than 99% with Estrogen-Progestin pills with the best compliance. Decision to change BCPs from the Progestin-only pill to Lo-LoEstrin Fe 1/10.  Usage reviewed and prescription sent to pharmacy.  Other orders - Norethindrone-Ethinyl Estradiol-Fe Biphas (LO LOESTRIN FE) 1 MG-10 MCG / 10 MCG tablet; Take 1 tablet by mouth daily.  Counseling on above issues and coordination of care more than 50% for 20 minutes.  Princess Bruins MD, 12:10 PM 12/05/2018

## 2018-12-05 NOTE — Patient Instructions (Signed)
1. Papanicolaou smear of cervix with positive high risk human papilloma virus (HPV) test Pap 11/14/2018 Negative with HPV HR positive.  Pap 2019 LGSIL.  Abnormal Pap and HR HPV Dx discussed with patient, management reviewed.  Colposcopy procedure explained.  Well tolerated.  Findings reviewed.  Management per results.  2. Low grade squamous intraepithelial lesion (LGSIL) at risk for high grade squamous intraepithelial lesion (HGSIL) on cytologic smear of cervix Pap in 08/2017 LGSIL  3. Encounter for initial prescription of contraceptive pills Counseling on contraception.  Patient informed of the higher risk of contraception failure with the Progestin-only pill with 95% efficacy rather than 99% with Estrogen-Progestin pills with the best compliance. Decision to change BCPs from the Progestin-only pill to Lo-LoEstrin Fe 1/10.  Usage reviewed and prescription sent to pharmacy.  Other orders - Norethindrone-Ethinyl Estradiol-Fe Biphas (LO LOESTRIN FE) 1 MG-10 MCG / 10 MCG tablet; Take 1 tablet by mouth daily.  Toni Livingston, it was a pleasure seeing you today!  I will inform you of your results as soon as they are available.

## 2018-12-06 LAB — HPV TYPE 16 AND 18/45 RNA
HPV Type 16 RNA: NOT DETECTED
HPV Type 18/45 RNA: NOT DETECTED

## 2018-12-07 LAB — TISSUE SPECIMEN

## 2018-12-07 LAB — PATHOLOGY REPORT

## 2019-01-30 ENCOUNTER — Ambulatory Visit: Payer: 59 | Admitting: Family Medicine

## 2019-02-01 ENCOUNTER — Encounter: Payer: Self-pay | Admitting: Family Medicine

## 2019-02-01 ENCOUNTER — Ambulatory Visit (INDEPENDENT_AMBULATORY_CARE_PROVIDER_SITE_OTHER): Payer: 59 | Admitting: Family Medicine

## 2019-02-01 ENCOUNTER — Telehealth: Payer: Self-pay | Admitting: Family Medicine

## 2019-02-01 ENCOUNTER — Other Ambulatory Visit: Payer: Self-pay

## 2019-02-01 VITALS — Ht 68.0 in | Wt 214.0 lb

## 2019-02-01 DIAGNOSIS — R1084 Generalized abdominal pain: Secondary | ICD-10-CM | POA: Diagnosis not present

## 2019-02-01 DIAGNOSIS — R112 Nausea with vomiting, unspecified: Secondary | ICD-10-CM | POA: Diagnosis not present

## 2019-02-01 DIAGNOSIS — R197 Diarrhea, unspecified: Secondary | ICD-10-CM

## 2019-02-01 MED ORDER — SACCHAROMYCES BOULARDII 250 MG PO CAPS: 250.0000 mg | ORAL_CAPSULE | Freq: Two times a day (BID) | ORAL | 1 refills | Status: DC

## 2019-02-01 MED ORDER — DICYCLOMINE HCL 20 MG PO TABS: ORAL_TABLET | ORAL | 1 refills | Status: DC

## 2019-02-01 MED ORDER — OMEPRAZOLE 40 MG PO CPDR: 40.0000 mg | DELAYED_RELEASE_CAPSULE | Freq: Every day | ORAL | 2 refills | Status: DC

## 2019-02-01 NOTE — Telephone Encounter (Signed)
Please make pt aware I also called in a probiotic to start. It is taken twice a day to help replace normal flora of the gut.

## 2019-02-01 NOTE — Progress Notes (Signed)
VIRTUAL VISIT VIA VIDEO  I connected with Toni Livingston on 02/01/19 at  1:30 PM EDT by a video enabled telemedicine application and verified that I am speaking with the correct person using two identifiers. Location patient: Home Location provider: [facility], Office Persons participating in the virtual visit: Patient, Dr. Raoul Pitch and R.Baker, LPN  I discussed the limitations of evaluation and management by telemedicine and the availability of in person appointments. The patient expressed understanding and agreed to proceed.   SUBJECTIVE Chief Complaint  Patient presents with   Abdominal Pain    Off and on pain x1 month ago. All over abdomen pain that happens at least 1x week. When she has cramps she has N/V/D. Denies fever.     HPI: Toni Livingston is a .23 y.o. female with intermittent abd pain for 1-2 month.  Patient reports she has not have symptoms like this in the past.  She did have a brief moment in high school where she was having a gluten sensitivity and refrain from gluten for 6 months.  She has never had issues since that time.  She reports about 1-2 months ago she started having diffuse abdominal cramping.  This can be as high as her epigastric region to diffusely over her abdomen.  She states it is not consistent with menstrual cramps.  She states her stomach feels like it will come very tight.  She does not always have bowel urgency during these times.  But there are occasions where she will feel as if she needs to have a bowel movement after meals and will have diarrhea episodes.  She does endorse bloating and increased passage of gas.  She does not endorse heartburn.  She does states she has a mild irritation or tickle cough in the back of her throat for some time.  She takes Advil when she has the cramping sensation.  She reports last evening was 1 of the times were she had the cramping after starting to eat her dinner and needed to have a bowel movement.  She had lasagna  for dinner last night.  She does endorse nausea with infrequent vomit with abdominal cramping. She reports her mother had to have her gallbladder removed.  There is no family history of irritable bowel disease.  ROS: See pertinent positives and negatives per HPI.  Patient Active Problem List   Diagnosis Date Noted   Vaginal high risk HPV DNA test positive 11/19/2018   Obesity (BMI 30-39.9) 11/14/2018   Pilonidal cyst 11/14/2018   LGSIL of cervix of undetermined significance 11/14/2018   Irregular menses 11/14/2018   Encounter for well woman exam with routine gynecological exam 09/13/2017   Oral contraceptive use 07/27/2016   Encounter for long-term current use of medication 07/27/2016    Social History   Tobacco Use   Smoking status: Never Smoker   Smokeless tobacco: Never Used  Substance Use Topics   Alcohol use: Yes    Comment: OCC    Current Outpatient Medications:    Multiple Vitamins-Minerals (MULTIVITAMIN ADULT) TABS, Take 1 tablet by mouth daily., Disp: , Rfl:    Norethindrone-Ethinyl Estradiol-Fe Biphas (LO LOESTRIN FE) 1 MG-10 MCG / 10 MCG tablet, Take 1 tablet by mouth daily., Disp: 3 Package, Rfl: 4   norethindrone (MICRONOR) 0.35 MG tablet, Take 1 tablet (0.35 mg total) by mouth daily. OFFICE VISIT NEEDED (Patient not taking: Reported on 02/01/2019), Disp: 3 Package, Rfl: 4  No Known Allergies  OBJECTIVE: Ht 5' 8"  (1.727 m)  Wt 214 lb (97.1 kg)    LMP 01/30/2019 (Exact Date)    BMI 32.54 kg/m  Gen: No acute distress. Nontoxic in appearance.  HENT: AT. [place].  MMM.  Eyes:Pupils Equal Round Reactive to light, Extraocular movements intact,  Conjunctiva without redness, discharge or icterus. Chest: Cough or shortness of breath not present Skin: No rashes, purpura or petechiae. Abdomen: She is asked to press over all quadrants of her abdomen and right upper quadrant.  She denies any pain. Neuro:  Normal gait. Alert. Oriented x3  Psych: Normal affect,  dress and demeanor. Normal speech. Normal thought content and judgment.  ASSESSMENT AND PLAN: Toni Livingston is a 23 y.o. female present for  Generalized abdominal pain/diarrhea/nausea Uncertain etiology.  Discussed differential diagnosis of gallbladder cause, pancreatic cause, irritable bowel syndrome, reflux, peptic ulcer disease, gluten sensitivity, lactose sensitivity. She reports the symptoms did start before she started the new birth control pill. -Start omeprazole daily. -Start probiotic twice daily, prescribed -Bentyl 3 times daily AC as needed - Comp Met (CMET); Future - H. pylori antibody, IgG; Future  - Tissue Transglutaminase Abs,IgG,IgA; Future - TSH; Future - CBC w/Diff; Future - Lipase; Future -Follow-up dependent upon lab results.  If pain worsens, may need to see in office for exam.  Consider ultrasound GB and stool studies.   > 25 minutes spent with patient, >50% of time spent face to face    Howard Pouch, DO 02/01/2019

## 2019-02-06 ENCOUNTER — Ambulatory Visit (INDEPENDENT_AMBULATORY_CARE_PROVIDER_SITE_OTHER): Payer: 59 | Admitting: Family Medicine

## 2019-02-06 ENCOUNTER — Other Ambulatory Visit: Payer: Self-pay

## 2019-02-06 DIAGNOSIS — R1084 Generalized abdominal pain: Secondary | ICD-10-CM | POA: Diagnosis not present

## 2019-02-06 DIAGNOSIS — R197 Diarrhea, unspecified: Secondary | ICD-10-CM

## 2019-02-06 DIAGNOSIS — R112 Nausea with vomiting, unspecified: Secondary | ICD-10-CM

## 2019-02-06 NOTE — Addendum Note (Signed)
Addended by: Ralph Dowdy on: 02/06/2019 10:26 AM   Modules accepted: Orders

## 2019-02-06 NOTE — Addendum Note (Signed)
Addended by: Ralph Dowdy on: 02/06/2019 10:27 AM   Modules accepted: Orders

## 2019-02-07 LAB — TISSUE TRANSGLUTAMINASE ABS,IGG,IGA
(tTG) Ab, IgA: 1 U/mL
(tTG) Ab, IgG: 2 U/mL

## 2019-02-07 LAB — CBC WITH DIFFERENTIAL/PLATELET
Absolute Monocytes: 526 cells/uL (ref 200–950)
Basophils Absolute: 22 cells/uL (ref 0–200)
Basophils Relative: 0.3 %
Eosinophils Absolute: 110 cells/uL (ref 15–500)
Eosinophils Relative: 1.5 %
HCT: 41.6 % (ref 35.0–45.0)
Hemoglobin: 13.9 g/dL (ref 11.7–15.5)
Lymphs Abs: 1372 cells/uL (ref 850–3900)
MCH: 27.6 pg (ref 27.0–33.0)
MCHC: 33.4 g/dL (ref 32.0–36.0)
MCV: 82.5 fL (ref 80.0–100.0)
MPV: 10.4 fL (ref 7.5–12.5)
Monocytes Relative: 7.2 %
Neutro Abs: 5271 cells/uL (ref 1500–7800)
Neutrophils Relative %: 72.2 %
Platelets: 421 10*3/uL — ABNORMAL HIGH (ref 140–400)
RBC: 5.04 10*6/uL (ref 3.80–5.10)
RDW: 13.1 % (ref 11.0–15.0)
Total Lymphocyte: 18.8 %
WBC: 7.3 10*3/uL (ref 3.8–10.8)

## 2019-02-07 LAB — COMPREHENSIVE METABOLIC PANEL
AG Ratio: 1.4 (calc) (ref 1.0–2.5)
ALT: 14 U/L (ref 6–29)
AST: 16 U/L (ref 10–30)
Albumin: 4.2 g/dL (ref 3.6–5.1)
Alkaline phosphatase (APISO): 99 U/L (ref 31–125)
BUN: 10 mg/dL (ref 7–25)
CO2: 23 mmol/L (ref 20–32)
Calcium: 9.6 mg/dL (ref 8.6–10.2)
Chloride: 103 mmol/L (ref 98–110)
Creat: 0.75 mg/dL (ref 0.50–1.10)
Globulin: 2.9 g/dL (calc) (ref 1.9–3.7)
Glucose, Bld: 88 mg/dL (ref 65–99)
Potassium: 4.4 mmol/L (ref 3.5–5.3)
Sodium: 137 mmol/L (ref 135–146)
Total Bilirubin: 0.3 mg/dL (ref 0.2–1.2)
Total Protein: 7.1 g/dL (ref 6.1–8.1)

## 2019-02-07 LAB — SEDIMENTATION RATE: Sed Rate: 14 mm/h (ref 0–20)

## 2019-02-07 LAB — LIPASE: Lipase: 38 U/L (ref 7–60)

## 2019-02-07 LAB — TSH: TSH: 1.4 mIU/L

## 2019-02-07 LAB — H. PYLORI ANTIBODY, IGG: H Pylori IgG: NEGATIVE

## 2019-02-11 ENCOUNTER — Telehealth: Payer: Self-pay | Admitting: Family Medicine

## 2019-02-11 NOTE — Telephone Encounter (Signed)
Please inform patient the following information: -Her labs are all normal.  Liver, kidneys, electrolytes and pancreas function all normal. - No anemia or infectious signs. -Thyroid is normal. - No signs of gluten intolerance/celiac disease by labs. -Start the regimen we discussed at her appointment.  Follow-up in 3 weeks.  Please schedule this for her.

## 2019-02-11 NOTE — Telephone Encounter (Signed)
Patient advised of all lab results.  Appt scheduled in office w/ Dr Raoul Pitch 02/27/2019 @ 2pm.

## 2019-02-27 ENCOUNTER — Encounter: Payer: Self-pay | Admitting: Family Medicine

## 2019-02-27 ENCOUNTER — Ambulatory Visit: Payer: 59 | Admitting: Family Medicine

## 2019-02-27 ENCOUNTER — Other Ambulatory Visit: Payer: Self-pay

## 2019-02-27 VITALS — BP 111/77 | HR 80 | Temp 97.9°F | Resp 18 | Ht 68.0 in | Wt 217.2 lb

## 2019-02-27 DIAGNOSIS — Z23 Encounter for immunization: Secondary | ICD-10-CM | POA: Diagnosis not present

## 2019-02-27 DIAGNOSIS — R109 Unspecified abdominal pain: Secondary | ICD-10-CM

## 2019-02-27 NOTE — Progress Notes (Signed)
VIRTUAL VISIT VIA VIDEO  I connected with Toni Livingston on 02/27/19 at  2:00 PM EDT by a video enabled telemedicine application and verified that I am speaking with the correct person using two identifiers. Location patient: Home Location provider: [facility], Office Persons participating in the virtual visit: Patient, Dr. Raoul Pitch and R.Baker, LPN  I discussed the limitations of evaluation and management by telemedicine and the availability of in person appointments. The patient expressed understanding and agreed to proceed.   SUBJECTIVE Chief Complaint  Patient presents with   Abdominal Pain    Pt states she has list of foods she has ate that did cause stomach pain. Pt states she is feeling much better     HPI: Toni Livingston is a .23 y.o. female for followup on abd pain. She has a food diary with her today. She states she has not had any symptoms in a few weeks. The symptoms she did notice with cheese products (Lasagna, mac and cheese, alfredo), also with cereal and chinese food once. She reports no symptoms when drinking milk. Since starting bentyl and omeprazole she has had no symptoms and is doing rather well.   Prior note:  intermittent abd pain for 1-2 month.  Patient reports she has not have symptoms like this in the past.  She did have a brief moment in high school where she was having a gluten sensitivity and refrain from gluten for 6 months.  She has never had issues since that time.  She reports about 1-2 months ago she started having diffuse abdominal cramping.  This can be as high as her epigastric region to diffusely over her abdomen.  She states it is not consistent with menstrual cramps.  She states her stomach feels like it will come very tight.  She does not always have bowel urgency during these times.  But there are occasions where she will feel as if she needs to have a bowel movement after meals and will have diarrhea episodes.  She does endorse bloating and  increased passage of gas.  She does not endorse heartburn.  She does states she has a mild irritation or tickle cough in the back of her throat for some time.  She takes Advil when she has the cramping sensation.  She reports last evening was 1 of the times were she had the cramping after starting to eat her dinner and needed to have a bowel movement.  She had lasagna for dinner last night.  She does endorse nausea with infrequent vomit with abdominal cramping. She reports her mother had to have her gallbladder removed.  There is no family history of irritable bowel disease.  ROS: See pertinent positives and negatives per HPI.  Patient Active Problem List   Diagnosis Date Noted   Vaginal high risk HPV DNA test positive 11/19/2018   Obesity (BMI 30-39.9) 11/14/2018   Pilonidal cyst 11/14/2018   LGSIL of cervix of undetermined significance 11/14/2018   Irregular menses 11/14/2018   Encounter for well woman exam with routine gynecological exam 09/13/2017   Oral contraceptive use 07/27/2016   Encounter for long-term current use of medication 07/27/2016    Social History   Tobacco Use   Smoking status: Never Smoker   Smokeless tobacco: Never Used  Substance Use Topics   Alcohol use: Yes    Comment: OCC    Current Outpatient Medications:    dicyclomine (BENTYL) 20 MG tablet, 3 times daily AC PRN, Disp: 90 tablet, Rfl: 1  Multiple Vitamins-Minerals (MULTIVITAMIN ADULT) TABS, Take 1 tablet by mouth daily., Disp: , Rfl:    Norethindrone-Ethinyl Estradiol-Fe Biphas (LO LOESTRIN FE) 1 MG-10 MCG / 10 MCG tablet, Take 1 tablet by mouth daily., Disp: 3 Package, Rfl: 4   omeprazole (PRILOSEC) 40 MG capsule, Take 1 capsule (40 mg total) by mouth daily., Disp: 30 capsule, Rfl: 2   saccharomyces boulardii (FLORASTOR) 250 MG capsule, Take 1 capsule (250 mg total) by mouth 2 (two) times daily. (Patient not taking: Reported on 02/27/2019), Disp: 60 capsule, Rfl: 1  No Known  Allergies  OBJECTIVE: BP 111/77 (BP Location: Left Arm, Patient Position: Sitting, Cuff Size: Normal)    Pulse 80    Temp 97.9 F (36.6 C) (Temporal)    Resp 18    Ht _0  (1.727 m)    Wt 217 lb 4 oz (98.5 kg)    LMP 01/30/2019 (Exact Date)    SpO2 98%    BMI 33.03 kg/m  Gen: Afebrile. No acute distress. Nontoxic.  HENT: AT. [place].  Eyes:Pupils Equal Round Reactive to light, Extraocular movements intact,  Conjunctiva without redness, discharge or icterus. Abd: Soft. obese. NTND. BS present. no Masses palpated. .  Neuro:  Normal gait. PERLA. EOMi. Alert. Oriented.  Psych: Normal affect, dress and demeanor. Normal speech. Normal thought content and judgment.  ASSESSMENT AND PLAN: Toni Livingston is a 23 y.o. female present for  Generalized abdominal pain/diarrhea/nausea - great improvement with PPI and bentyl- pharmacy never filled the probiotic.  She reports the symptoms did start before she started the new birth control pill. - continue omeprazole daily. If symptoms return after finishing omeprazole, then she ill call in and we will prescribed the lower dose (no appt needed) -Start probiotic tif desired- pharmacy never filled this script for her.  - continue Bentyl 3 times daily AC as needed. Uses only on occasions.  - Comp Met (CMET); Future>>normal - H. pylori antibody, IgG; Future >> negative - Tissue Transglutaminase Abs,IgG,IgA >> normal - TSH>> normal - CBC w/Diff >> normal - Lipase>>future - f/u PRN.   Flu shot provided today.     Howard Pouch, DO 02/27/2019

## 2019-02-27 NOTE — Patient Instructions (Addendum)
If symptoms return after finishing omeprazole, then call in and we will prescribed the lower dose for you.

## 2019-04-05 ENCOUNTER — Telehealth: Payer: Self-pay

## 2019-04-05 NOTE — Telephone Encounter (Signed)
Fax from [facility]  Recommending systemic work up with labs. OV notes requested and received. Placed on Dr Lucita Lora desk to review.

## 2019-04-10 ENCOUNTER — Encounter: Payer: Self-pay | Admitting: Family Medicine

## 2019-04-10 ENCOUNTER — Ambulatory Visit: Payer: 59 | Admitting: Family Medicine

## 2019-04-10 ENCOUNTER — Other Ambulatory Visit: Payer: Self-pay

## 2019-04-10 VITALS — BP 117/80 | HR 72 | Temp 98.3°F | Resp 16 | Ht 68.0 in | Wt 213.0 lb

## 2019-04-10 DIAGNOSIS — M5136 Other intervertebral disc degeneration, lumbar region: Secondary | ICD-10-CM | POA: Diagnosis not present

## 2019-04-10 DIAGNOSIS — Z111 Encounter for screening for respiratory tuberculosis: Secondary | ICD-10-CM | POA: Diagnosis not present

## 2019-04-10 DIAGNOSIS — H209 Unspecified iridocyclitis: Secondary | ICD-10-CM | POA: Insufficient documentation

## 2019-04-10 NOTE — Progress Notes (Signed)
Toni Livingston , 09/24/1995, 23 y.o., female MRN: [id number] Patient Care Team    Relationship Specialty Notifications Start End  Ma Hillock, DO PCP - General Family Medicine  07/27/16     Chief Complaint  Patient presents with  . lab work    Pt went to [facility] doctor and eye doctor sent request for lab work to be completed      Subjective: Pt presents for an OV on the encouragement of her ophthalmologist.  Patient reports she has had iritis since 2016.  She states this occurs infrequently and she usually uses eyedrops and it resolves.  She reports the first time she had symptoms was after a bad sunburn on her face.  Patient reports her ophthalmologist desired further testing to attempt to find the cause of her iritis.  Patient denies personal or family history of autoimmune disorders, skin conditions or arthritis.  She states the only exception is her sister has mild eczema.  Patient is having difficulties with her lower back and has been seeing a spine specialist.  She reports they told her she had mild lower lumbar degenerative changes, they did not feel that was the cause of her pain since changes were so mild in correlation to her pain level.  She is still currently under work-up for her lower back discomfort. Labs 01/2019: CMP normal, CBC normal.  ESR 14-normal.  TSH normal.  IgA/IgG normal.  Depression screen [facility] 2/9 11/14/2018 09/13/2017 07/27/2016  Decreased Interest 0 0 0  Down, Depressed, Hopeless 0 0 0  PHQ - 2 Score 0 0 0    No Known Allergies Social History   Social History Narrative   Single. Some college.    Works as Therapist, nutritional.    Drinks caffeine.    Wears seatbelt. Smoke detector in the home.    Exercises routinely.    Feels safe in relationships.       Past Medical History:  Diagnosis Date  . Vertigo    Past Surgical History:  Procedure Laterality Date  . BREAST REDUCTION SURGERY  2015   Family History  Problem Relation Age of Onset  . Testicular  cancer Father   . Uterine cancer Maternal Aunt    Allergies as of 04/10/2019   No Known Allergies     Medication List       Accurate as of April 10, 2019 11:59 PM. If you have any questions, ask your nurse or doctor.        STOP taking these medications   omeprazole 40 MG capsule Commonly known as: PRILOSEC Stopped by: Howard Pouch, DO     TAKE these medications   dicyclomine 20 MG tablet Commonly known as: Bentyl 3 times daily AC PRN   Durezol 0.05 % Emul Generic drug: Difluprednate PLACE 1 DROP IN LEFT EYE EVERY 2 HOURS TODAY AND TOMORROW, THEN 4 TIMES A DAY   Lo Loestrin Fe 1 MG-10 MCG / 10 MCG tablet Generic drug: Norethindrone-Ethinyl Estradiol-Fe Biphas Take 1 tablet by mouth daily.   Multivitamin Adult Tabs Take 1 tablet by mouth daily.   saccharomyces boulardii 250 MG capsule Commonly known as: Florastor Take 1 capsule (250 mg total) by mouth 2 (two) times daily.       All past medical history, surgical history, allergies, family history, immunizations andmedications were updated in the EMR today and reviewed under the history and medication portions of their EMR.     ROS: Negative, with the exception of above mentioned  in HPI   Objective:  BP 117/80 (BP Location: Left Arm, Patient Position: Sitting, Cuff Size: Normal)   Pulse 72   Temp 98.3 F (36.8 C) (Temporal)   Resp 16   Ht 5' 8"  (1.727 m)   Wt 213 lb (96.6 kg)   LMP 04/05/2019 (Exact Date)   SpO2 98%   BMI 32.39 kg/m  Body mass index is 32.39 kg/m. Gen: Afebrile. No acute distress. Nontoxic in appearance, well developed, well nourished.  HENT: AT. [place].  Eyes:Pupils Equal Round Reactive to light, Extraocular movements intact,  Conjunctiva without redness, discharge or icterus. Neck/lymp/endocrine: Supple, no lymphadenopathy CV: RRR, no edema Chest: CTAB, no wheeze or crackles. Good air movement, normal resp effort.  MSK: No erythema, no joint swelling, full range of motion all 4  extremities. Skin: No rashes, purpura or petechiae.  Neuro:  Normal gait. PERLA. EOMi. Alert. Oriented x3 . Psych: Normal affect, dress and demeanor. Normal speech. Normal thought content and judgment.  Assessment/Plan: Toni Livingston is a 23 y.o. female present for OV for  Iritis/degenerative disc disease lumbar Discussed different potential etiologies of iritis with her today including autoimmune, inflammatory arthritis, sarcoidosis, IBD, MS or simply could be from trauma secondary to the severe sunburn she sustained when symptoms first presented.  Patient currently is 1 week out from an acute flare and symptoms are improved.  Agreed to initiate work-up today to rule out some of the above causes, which also would see if there is an association with her iritis and her low back discomfort. - CBC - Sedimentation rate - ANA, IFA Comprehensive Panel-([facility]) - RPR - HLA-B27 antigen - Angiotensin converting enzyme - QuantiFERON-TB Gold Plus>> tuberculosis screening  -Follow-up dependent upon lab results.  Will forward office visit note and lab results to ophthalmologist once completed.    Reviewed expectations re: course of current medical issues.  Discussed self-management of symptoms.  Outlined signs and symptoms indicating need for more acute intervention.  Patient verbalized understanding and all questions were answered.  Patient received an After-Visit Summary.    Orders Placed This Encounter  Procedures  . CBC  . Sedimentation rate  . ANA, IFA Comprehensive Panel-([facility])  . RPR  . HLA-B27 antigen  . Angiotensin converting enzyme  . QuantiFERON-TB Gold Plus     Note is dictated utilizing voice recognition software. Although note has been proof read prior to signing, occasional typographical errors still can be missed. If any questions arise, please do not hesitate to call for verification.   electronically signed by:  Howard Pouch, DO  [place]

## 2019-04-10 NOTE — Patient Instructions (Signed)
We will call you with lab results once available and then forward them to your eye doctor.   Uveitis/iritis Uveitis is swelling and irritation in the eye. Most of the time, it affects the middle part of the eye (uvea). There are many types of uveitis:  Iritis. This type affects the colored part of the eye.  Intermediate uveitis. This type affects the middle of the eye.  Posterior uveitis. This type affects the back of the eye.  Panuveitis. This type affects all layers of the eye. Uveitis can affect one eye or both eyes. Over time, the condition may lead to vision loss. Follow these instructions at home:   Take over-the-counter and prescription medicines only as told by your doctor.  Follow instructions for safely putting eye drops in your eyes.  Drink enough fluid to keep your pee (urine) pale yellow.  Follow instructions from your doctor about what activities are safe for you.  Do not use any products that contain nicotine or tobacco, such as cigarettes and e-cigarettes. If you need help quitting, ask your doctor.  Keep all follow-up visits as told by your doctor. This is important. Contact a doctor if:  Your symptoms do not get better. Get help right away if:  You cannot see as much as you did before.  You have more redness in one eye or both eyes.  Light bothers your eyes a lot.  You have pain in your eye.  You have aching in your eye. Summary  Uveitis is swelling and irritation in the eye. Most of the time, it affects the middle part of the eye (uvea).  Uveitis can affect one eye or both eyes. Over time, the condition may lead to vision loss.  Follow instructions for safely putting eye drops in your eyes.  Keep all follow-up visits as told by your doctor. This is important. This information is not intended to replace advice given to you by your health care provider. Make sure you discuss any questions you have with your health care provider. Document Released:  10/21/2014 Document Revised: 06/28/2017 Document Reviewed: 06/27/2017 Elsevier Patient Education  2020 [employer].

## 2019-04-11 ENCOUNTER — Encounter: Payer: Self-pay | Admitting: Family Medicine

## 2019-04-12 LAB — QUANTIFERON-TB GOLD PLUS
Mitogen-NIL: 7.69 IU/mL
NIL: 0.03 IU/mL
QuantiFERON-TB Gold Plus: NEGATIVE
TB1-NIL: 0 IU/mL
TB2-NIL: 0 IU/mL

## 2019-04-16 ENCOUNTER — Telehealth: Payer: Self-pay | Admitting: Family Medicine

## 2019-04-16 DIAGNOSIS — H209 Unspecified iridocyclitis: Secondary | ICD-10-CM

## 2019-04-16 DIAGNOSIS — R768 Other specified abnormal immunological findings in serum: Secondary | ICD-10-CM | POA: Insufficient documentation

## 2019-04-16 DIAGNOSIS — M5136 Other intervertebral disc degeneration, lumbar region: Secondary | ICD-10-CM

## 2019-04-16 DIAGNOSIS — Z1589 Genetic susceptibility to other disease: Secondary | ICD-10-CM | POA: Insufficient documentation

## 2019-04-16 LAB — TEST AUTHORIZATION

## 2019-04-16 LAB — SEDIMENTATION RATE: Sed Rate: 22 mm/h — ABNORMAL HIGH (ref 0–20)

## 2019-04-16 LAB — ANTI-NUCLEAR AB-TITER (ANA TITER)
ANA TITER: 1:80 {titer} — ABNORMAL HIGH
ANA Titer 1: 1:80 {titer} — ABNORMAL HIGH

## 2019-04-16 LAB — CBC
HCT: 40.5 % (ref 35.0–45.0)
Hemoglobin: 13.4 g/dL (ref 11.7–15.5)
MCH: 27.5 pg (ref 27.0–33.0)
MCHC: 33.1 g/dL (ref 32.0–36.0)
MCV: 83 fL (ref 80.0–100.0)
MPV: 10 fL (ref 7.5–12.5)
Platelets: 423 10*3/uL — ABNORMAL HIGH (ref 140–400)
RBC: 4.88 10*6/uL (ref 3.80–5.10)
RDW: 12.8 % (ref 11.0–15.0)
WBC: 6.4 10*3/uL (ref 3.8–10.8)

## 2019-04-16 LAB — RHEUMATOID FACTOR: Rheumatoid fact SerPl-aCnc: <14 IU/mL (ref <14)

## 2019-04-16 LAB — ANA, IFA COMPREHENSIVE PANEL
Anti Nuclear Antibody (ANA): POSITIVE — AB
ENA SM Ab Ser-aCnc: <1 AI
SM/RNP: <1 AI
SSA (Ro) (ENA) Antibody, IgG: <1 AI
SSB (La) (ENA) Antibody, IgG: <1 AI
Scleroderma (Scl-70) (ENA) Antibody, IgG: <1 AI
ds DNA Ab: <1 IU/mL

## 2019-04-16 LAB — HLA-B27 ANTIGEN: HLA-B27 Antigen: POSITIVE — AB

## 2019-04-16 LAB — ANGIOTENSIN CONVERTING ENZYME: Angiotensin-Converting Enzyme: 26 U/L (ref 9–67)

## 2019-04-16 LAB — RPR: RPR Ser Ql: NONREACTIVE

## 2019-04-16 NOTE — Progress Notes (Signed)
Office Visit Note  Patient: Toni Livingston             Date of Birth: 1995/08/05           MRN: [id number]             PCP: Ma Hillock, DO Referring: Ma Hillock, DO Visit Date: 04/17/2019 Occupation: [employer] officer  Subjective:  Lower back pain and stiffness.   History of Present Illness: Toni Livingston is a 23 y.o. female seen in consultation per request of Dr. Raoul Pitch.  According to patient at age 34 she started having knee joint and lower back pain.  She states eventually the knee pain improved but the lower back pain persist.  She started having both upper and lower back pain.  She underwent breast reduction surgery at age 85 which helped her upper back pain but the lower back pain persist.  In 2016 she had x-ray of her lumbar spine which were within normal limits.  He also had MRI of her lumbar spine which shows disc disease.  She was advised to change shoes and do stretching exercises.  She started going to the chiropractor in [place].  She was told that she had SI joint dysfunction and was referred to physical therapy.  She moved to [facility] in March 2017.  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  She hurt her back during that.  She states she was seen by her PCP and was given some pain medications.  She was also seen by chiropractor who analyzed her and told her that she had tilted her hips which may be related to a fall from a horse few years back.  He did some adjustments for her.  Then [facility] where she has been giving amitriptyline.  She states it helps with insomnia but not with the back pain.  She recalls having first episode of iritis in 2016.  She was treated with prednisone eyedrops for about 6 months.  She has few mild flares which she treated herself by using the drops.  In 2019 while she was in [place] she had iritis flare and was seen at [facility].  She was given prednisone eyedrops which  she again used on as needed basis for mild flares.  She was recently seen by an optometrist there who advised getting labs due to recurrent iritis.  She had labs done by Dr. Raoul Pitch which showed positive HLA-B27 and ANA.  Continues to have discomfort in her lower back and SI joints.  She reports some stiffness in her hands and difficulty with gripping objects.  Activities of Daily Living:  Patient reports morning stiffness for 1 hour.   Patient Reports nocturnal pain.  Difficulty dressing/grooming: Reports Difficulty climbing stairs: Denies Difficulty getting out of chair: Reports Difficulty using hands for taps, buttons, cutlery, and/or writing: Reports  Review of Systems  Constitutional: Positive for fatigue. Negative for night sweats, weight gain and weight loss.  HENT: Negative for mouth sores, trouble swallowing, trouble swallowing, mouth dryness and nose dryness.   Eyes: Negative for pain, redness, itching, visual disturbance and dryness.  Respiratory: Negative for cough, shortness of breath, wheezing and difficulty breathing.   Cardiovascular: Negative for chest pain, palpitations, hypertension, irregular heartbeat and swelling in legs/feet.  Gastrointestinal: Negative for blood in stool, constipation and diarrhea.  Endocrine: Negative for increased urination.  Genitourinary: Negative for difficulty urinating, painful urination and vaginal dryness.  Musculoskeletal: Positive for arthralgias, joint pain and morning stiffness. Negative for joint swelling, myalgias, muscle weakness, muscle tenderness and myalgias.  Skin: Negative for color change, rash, hair loss, skin tightness, ulcers and sensitivity to sunlight.  Allergic/Immunologic: Negative for susceptible to infections.  Neurological: Positive for headaches. Negative for dizziness, light-headedness, numbness, memory loss, night sweats and weakness.  Hematological: Negative for bruising/bleeding tendency and swollen glands.    Psychiatric/Behavioral: Positive for sleep disturbance. Negative for depressed mood and confusion. The patient is not nervous/anxious.     PMFS History:  Patient Active Problem List   Diagnosis Date Noted   HLA B27 (HLA B27 positive) 04/16/2019   ANA positive 04/16/2019   Iritis 04/10/2019   DDD (degenerative disc disease), lumbar 04/10/2019   Vaginal high risk HPV DNA test positive 11/19/2018   Obesity (BMI 30-39.9) 11/14/2018   Pilonidal cyst 11/14/2018   LGSIL of cervix of undetermined significance 11/14/2018   Irregular menses 11/14/2018   Encounter for well woman exam with routine gynecological exam 09/13/2017   Oral contraceptive use 07/27/2016   Encounter for long-term current use of medication 07/27/2016    Past Medical History:  Diagnosis Date   Vertigo     Family History  Problem Relation Age of Onset   Hypertension Mother    Testicular cancer Father    Healthy Sister    Healthy Brother    Uterine cancer Maternal Aunt    Healthy Brother    Depression Sister    Anxiety disorder Sister    Past Surgical History:  Procedure Laterality Date   BREAST REDUCTION SURGERY  2015   Social History   Social History Narrative   Single. Some college.    Works as Therapist, nutritional.    Drinks caffeine.    Wears seatbelt. Smoke detector in the home.    Exercises routinely.    Feels safe in relationships.       Immunization History  Administered Date(s) Administered   Influenza,inj,Quad PF,6+ Mos 03/09/2017, 03/05/2018, 02/27/2019   Influenza-Unspecified 03/20/2016   PPD Test 07/27/2016   Tdap 07/27/2016     Objective: Vital Signs: BP 118/77 (BP Location: Right Arm, Patient Position: Sitting, Cuff Size: Normal)    Pulse 74    Resp 12    Ht 5' 8"  (1.727 m)    Wt 211 lb 6.4 oz (95.9 kg)    LMP 04/05/2019 (Exact Date)    BMI 32.14 kg/m    Physical Exam Vitals signs and nursing note reviewed.  Constitutional:      Appearance: She is  well-developed.  HENT:     Head: Normocephalic and atraumatic.  Eyes:     Conjunctiva/sclera: Conjunctivae normal.  Neck:     Musculoskeletal: Normal range of motion.  Cardiovascular:     Rate and Rhythm: Normal rate and regular rhythm.     Heart sounds: Normal heart sounds.  Pulmonary:     Effort: Pulmonary effort is normal.     Breath sounds: Normal breath sounds.  Abdominal:     General: Bowel sounds are normal.     Palpations: Abdomen is soft.  Lymphadenopathy:     Cervical: No cervical adenopathy.  Skin:    General: Skin is warm and dry.     Capillary Refill: Capillary refill takes less than 2 seconds.  Neurological:     Mental Status: She is alert and oriented to person, place, and time.  Psychiatric:        Behavior: Behavior normal.  Musculoskeletal Exam: C-spine and thoracic spine were in good range of motion.  She has no point tenderness over thoracic or lumbar spine.  She had tenderness over sacral region and mildly over SI joints.  Shoulder joints, elbow joints, wrist joints, MCPs PIPs and DIPs with good range of motion with no synovitis.  Hip joints, knee joints, ankles MTPs PIPs with good range of motion with no synovitis.  There was no evidence of plantar fasciitis or Achilles tendinitis.  CDAI Exam: CDAI Score: -- Patient Global: --; Provider Global: -- Swollen: --; Tender: -- Joint Exam   No joint exam has been documented for this visit   There is currently no information documented on the homunculus. Go to the Rheumatology activity and complete the homunculus joint exam.  Investigation: Findings:  04/10/2019: ANA 1:80 Mitotic, intercellular bridge, 1:80 NH, ESR 22, RNP negative, HLA-B27 positive, Ace 26, RF<14, TB gold negative, Ro-, La-, RNP-, Scl-70-, dsDNA-  Component     Latest Ref Rng & Units 04/10/2019  Anti Nuclear Antibody (ANA)     NEGATIVE POSITIVE (A)  ds DNA Ab     IU/mL <1  Scleroderma (Scl-70) (ENA) Antibody, IgG     <1.0 NEG AI  <1.0 NEG  ENA SM Ab Ser-aCnc     <1.0 NEG AI <1.0 NEG  SM/RNP     <1.0 NEG AI <1.0 NEG  SSA (Ro) (ENA) Antibody, IgG     <1.0 NEG AI <1.0 NEG  SSB (La) (ENA) Antibody, IgG     <1.0 NEG AI <1.0 NEG  QuantiFERON-TB Gold Plus     NEGATIVE NEGATIVE  NIL     IU/mL 0.03  Mitogen-NIL     IU/mL 7.69  TB1-NIL     IU/mL 0.00  TB2-NIL     IU/mL 0.00  ANA Titer 1     titer 1:80 (H)  ANA Pattern 1      Mitotic, Intercellular Bridge (A)  ANA TITER     titer 1:80 (H)  ANA PATTERN      Nuclear, Homogeneous (A)  Sed Rate     0 - 20 mm/h 22 (H)  RPR     NON-REACTI NON-REACTIVE  HLA-B27 Antigen     NEGATIVE POSITIVE (A)  Angiotensin-Converting Enzyme     9 - 67 U/L 26  RA Latex Turbid.     <14 IU/mL <14   Imaging: Xr Lumbar Spine 2-3 Views  Result Date: 04/17/2019 No joint space narrowing was noted.  No syndesmophytes were noted.  No significant facet joint arthropathy was noted. Impression: Unremarkable x-ray of the lumbar spine.  Xr Pelvis 1-2 Views  Result Date: 04/17/2019 Bilateral SI joint to sclerosis was noted.  No joint space narrowing was noted. Impression: These findings are consistent with bilateral sacroiliitis.   Recent Labs: Lab Results  Component Value Date   WBC 6.4 04/10/2019   HGB 13.4 04/10/2019   PLT 423 (H) 04/10/2019   NA 137 02/06/2019   K 4.4 02/06/2019   CL 103 02/06/2019   CO2 23 02/06/2019   GLUCOSE 88 02/06/2019   BUN 10 02/06/2019   CREATININE 0.75 02/06/2019   BILITOT 0.3 02/06/2019   ALKPHOS 104 11/14/2018   AST 16 02/06/2019   ALT 14 02/06/2019   PROT 7.1 02/06/2019   ALBUMIN 4.3 11/14/2018   CALCIUM 9.6 02/06/2019   QFTBGOLDPLUS NEGATIVE 04/10/2019    Speciality Comments: No specialty comments available.  Procedures:  No procedures performed Allergies: Patient has no known allergies.  Assessment / Plan:     Visit Diagnoses: Chronic SI joint pain -patient gives history of chronic SI joint pain for the last many years.  Her  symptoms are started when she was 23 years old.  She continues to have morning stiffness and discomfort in her lumbar spine lasting all day and even having nocturnal pain.  Plan: XR Pelvis 1-2 Views.  X-rays today showed bilateral SI joint to sclerosis consistent with sacroiliitis.  These findings are consistent with ankylosing spondylitis.  Detailed counseling guarding ankylosing spondylitis provided.  Different treatment options and their side effects were discussed.  As she also has recurrent iritis Humira will be the right choice.  We will obtain some additional labs today.  The plan is to start her on Humira once approved.  Chronic midline low back pain without sciatica -she complains of lower back pain.  Lumbar spine x-rays were unremarkable.  Plan: XR Lumbar Spine 2-3 Views  Iritis-patient has had recurrent iritis since 2016.  Her most recent episode was 2 weeks ago.  She has been using prednisone eyedrops.  HLA B27 positive  ANA positive - 04/10/2019: ANA 1:80 Mitotic, intercellular bridge, 1:80 NH, ESR 22, RNP negative, HLA-B27 positive, Ace 26, RF<14, TB gold negative, Ro-, La-, RNP-, Scl-70-, d.  ANA is low titer and is nonspecific.  Oral contraceptive use  Vaginal high risk HPV DNA test positive  Pilonidal cyst  Orders: Orders Placed This Encounter  Procedures   XR Pelvis 1-2 Views   XR Lumbar Spine 2-3 Views   DG Chest 2 View   CBC with Differential/Platelet   COMPLETE METABOLIC PANEL WITH GFR   Hepatitis panel, acute   Serum protein electrophoresis with reflex   IgG, IgA, IgM   No orders of the defined types were placed in this encounter.   Face-to-face time spent with patient was 50 minutes. Greater than 50% of time was spent in counseling and coordination of care.  Follow-Up Instructions: Return for Ankylosing spondylitis, iritis, HLA-B27 positive.   Bo Merino, MD  Note - This record has been created using Editor, commissioning.  Chart creation errors  have been sought, but may not always  have been located. Such creation errors do not reflect on  the standard of medical care.

## 2019-04-16 NOTE — Telephone Encounter (Signed)
Pt was called and given information. She verbalizes understanding and has appt tomorrow with Rheumatology. Labs sent to all care teams.

## 2019-04-16 NOTE — Telephone Encounter (Signed)
Please inform patient the following information: - Blood counts, RPR (syphillis), ACE (sarcoid test), TB, rheumatoid factor are normal.  - Her sed rate- inflammatory marker is very mildly elevated at 22, normal < 20. This is expected with her having a flare of her iritis at that time. It was normal in August. - Her ANA - Autoimmune screen was positive, but not what rheumatology commonly refers to as "strongly positive." However her HLA-B27 is positive which is associated with certain arthritic conditions and iritis.  - Since she is having difficulty with her back and flare in iritis, I do believe a referral to rheumatology is warranted to attempt to provide a more definitive answer. I have placed this for her and she will get a call to schedule.   If she would like to review results in more detail with this provider, we can arrange a VV with her if desired. I know it is a good bit of info to take in.   Please fax all results to her ophthalmologist (Dr. Patel> need to ask pt  Location- I believe she is with [facility]>> please add to careteam as well. ) and her spinal doctor (Dr. Rennis Harding @[facility]).

## 2019-04-17 ENCOUNTER — Ambulatory Visit: Payer: Self-pay

## 2019-04-17 ENCOUNTER — Telehealth: Payer: Self-pay | Admitting: Pharmacist

## 2019-04-17 ENCOUNTER — Ambulatory Visit (INDEPENDENT_AMBULATORY_CARE_PROVIDER_SITE_OTHER): Payer: 59 | Admitting: Rheumatology

## 2019-04-17 ENCOUNTER — Other Ambulatory Visit: Payer: Self-pay

## 2019-04-17 ENCOUNTER — Encounter: Payer: Self-pay | Admitting: Rheumatology

## 2019-04-17 VITALS — BP 118/77 | HR 74 | Resp 12 | Ht 68.0 in | Wt 211.4 lb

## 2019-04-17 DIAGNOSIS — G8929 Other chronic pain: Secondary | ICD-10-CM

## 2019-04-17 DIAGNOSIS — M533 Sacrococcygeal disorders, not elsewhere classified: Secondary | ICD-10-CM

## 2019-04-17 DIAGNOSIS — R87811 Vaginal high risk human papillomavirus (HPV) DNA test positive: Secondary | ICD-10-CM

## 2019-04-17 DIAGNOSIS — H209 Unspecified iridocyclitis: Secondary | ICD-10-CM | POA: Diagnosis not present

## 2019-04-17 DIAGNOSIS — Z3041 Encounter for surveillance of contraceptive pills: Secondary | ICD-10-CM

## 2019-04-17 DIAGNOSIS — Z79899 Other long term (current) drug therapy: Secondary | ICD-10-CM

## 2019-04-17 DIAGNOSIS — Z1589 Genetic susceptibility to other disease: Secondary | ICD-10-CM | POA: Diagnosis not present

## 2019-04-17 DIAGNOSIS — M545 Low back pain: Secondary | ICD-10-CM

## 2019-04-17 DIAGNOSIS — L0591 Pilonidal cyst without abscess: Secondary | ICD-10-CM

## 2019-04-17 DIAGNOSIS — R768 Other specified abnormal immunological findings in serum: Secondary | ICD-10-CM

## 2019-04-17 NOTE — Patient Instructions (Addendum)
We will update you when we hear a response from insurance regarding Humira approval and next steps.  Go to [facility] for chest x-ray. Address is  [address], [place], [place] [address].  From [address] take a left on [address], left on [address] and next left at main entrance and follow signs for visitor parking.  Go to main entrance and ask for radiology department and they will be able to direct you.   Adalimumab Injection What is this medicine? ADALIMUMAB (a dal AYE mu mab) is used to treat rheumatoid and psoriatic arthritis. It is also used to treat ankylosing spondylitis, Crohn's disease, ulcerative colitis, plaque psoriasis, hidradenitis suppurativa, and uveitis. This medicine may be used for other purposes; ask your health care provider or pharmacist if you have questions. COMMON BRAND NAME(S): CYLTEZO, Humira What should I tell my health care provider before I take this medicine? They need to know if you have any of these conditions:  diabetes  heart disease  hepatitis B or history of hepatitis B infection  immune system problems  infection or history of infections  multiple sclerosis  recently received or scheduled to receive a vaccine  scheduled to have surgery  tuberculosis, a positive skin test for tuberculosis or have recently been in close contact with someone who has tuberculosis  an unusual reaction to adalimumab, other medicines, mannitol, latex, rubber, foods, dyes, or preservatives  pregnant or trying to get pregnant  breast-feeding How should I use this medicine? This medicine is for injection under the skin. You will be taught how to prepare and give this medicine. Use exactly as directed. Take your medicine at regular intervals. Do not take your medicine more often than directed. A special MedGuide will be given to you by the pharmacist with each prescription and refill. Be sure to read this information carefully each  time. It is important that you put your used needles and syringes in a special sharps container. Do not put them in a trash can. If you do not have a sharps container, call your pharmacist or healthcare provider to get one. Talk to your pediatrician regarding the use of this medicine in children. While this drug may be prescribed for children as young as 2 years for selected conditions, precautions do apply. The manufacturer of the medicine offers free information to patients and their health care partners. Call [phone] for more information. Overdosage: If you think you have taken too much of this medicine contact a poison control center or emergency room at once. NOTE: This medicine is only for you. Do not share this medicine with others. What if I miss a dose? If you miss a dose, take it as soon as you can. If it is almost time for your next dose, take only that dose. Do not take double or extra doses. Give the next dose when your next scheduled dose is due. Call your doctor or health care professional if you are not sure how to handle a missed dose. What may interact with this medicine? Do not take this medicine with any of the following medications:  abatacept  anakinra  etanercept  infliximab  live virus vaccines  rilonacept This medicine may also interact with the following medications:  vaccines This list may not describe all possible interactions. Give your health care provider a list of all the medicines, herbs, non-prescription drugs, or dietary supplements you use. Also tell them if you smoke, drink alcohol, or use  illegal drugs. Some items may interact with your medicine. What should I watch for while using this medicine? Visit your doctor or health care professional for regular checks on your progress. Tell your doctor or healthcare professional if your symptoms do not start to get better or if they get worse. You will be tested for tuberculosis (TB) before you start  this medicine. If your doctor prescribes any medicine for TB, you should start taking the TB medicine before starting this medicine. Make sure to finish the full course of TB medicine. Call your doctor or health care professional if you get a cold or other infection while receiving this medicine. Do not treat yourself. This medicine may decrease your body's ability to fight infection. Talk to your doctor about your risk of cancer. You may be more at risk for certain types of cancers if you take this medicine. What side effects may I notice from receiving this medicine? Side effects that you should report to your doctor or health care professional as soon as possible:  allergic reactions like skin rash, itching or hives, swelling of the face, lips, or tongue  breathing problems  changes in vision  chest pain  fever, chills, or any other sign of infection  numbness or tingling  red, scaly patches or raised bumps on the skin  swelling of the ankles  swollen lymph nodes in the neck, underarm, or groin areas  unexplained weight loss  unusual bleeding or bruising  unusually weak or tired Side effects that usually do not require medical attention (report to your doctor or health care professional if they continue or are bothersome):  headache  nausea  redness, itching, swelling, or bruising at site where injected This list may not describe all possible side effects. Call your doctor for medical advice about side effects. You may report side effects to FDA at 1-800-FDA-1088. Where should I keep my medicine? Keep out of the reach of children. Store in the original container and in the refrigerator between 2 and 8 degrees C (36 and 46 degrees F). Do not freeze. The product may be stored in a cool carrier with an ice pack, if needed. Protect from light. Throw away any unused medicine after the expiration date. NOTE: This sheet is a summary. It may not cover all possible information. If  you have questions about this medicine, talk to your doctor, pharmacist, or health care provider.  2020 Elsevier/Gold Standard (2018-03-26 13:22:46) Ankylosing Spondylitis, Adult  Ankylosing spondylitis (AS) is a long-term (chronic) condition that causes swelling and irritation (inflammation), often in the spine. Over time, the inflammation can make new bone form in the spine. This can result in the spinal joints fusing together (ankylosis) and loss of movement. In some people, inflammation may affect other areas of the body, such as the shoulders, hips, ribs, and small joints in the hands and feet. Sometimes the eyes are affected. Inflammation can also happen in the joints of the back and in the tissues that connect joints to bones (ligaments) or connect muscles to bones (tendons). As the disease gets worse, the joints that connect the spine to the pelvis (sacroiliac or SIjoints) are often affected. The condition can range from mild to severe. You may have more severe AS if you smoke. What are the causes? The exact cause of this condition is not known. It may be caused by abnormal genes that are passed down through families. The most common gene that has been linked to AS produces a protein  called HLA-B27. Even if you have genes for AS, they may need to be triggered to become active. Infection may be one trigger. What increases the risk? You are more likely to develop this condition if:  You have a family history of AS.  You are between the ages of 60 and 11.  You are female.  You have frequent gastrointestinal infections. What are the signs or symptoms? The most common symptoms are pain and stiffness that get worse with rest and better with movement. Pain may be worse at night, and stiffness may be worse in the morning. Symptoms also depend on where the inflammation occurs:  Inflammation of the SI joints causes pain in the lower back and buttocks. You may also feel pain in your hips.   Inflammation in the upper spine, neck, and ribs causes pain in those areas. Rib inflammation may cause shortness of breath.  Inflammation in the shoulder, fingers, knees, ankles, toes, or heels may cause pain and stiffness in those areas.  Inflammation in the eyes may cause eye pain, redness, or visual disturbances.  Generalized inflammation may cause fever, loss of appetite, and fatigue. How is this diagnosed? This condition may be diagnosed based on:  Your symptoms and your medical and family history.  A physical exam.  Tests, such as: ? X-rays or an MRI to look for joint changes or inflammation. ? A blood test to check for the protein HLA-B27. You may be referred to a health care provider who specializes in diseases of the bones, muscles, and joints (rheumatologist). How is this treated? There is no cure for this condition, but treatment can reduce symptoms. Treatment options may include:  Medicines, such as: ? Nonsteroidal anti-inflammatory drugs (NSAIDs). These may be used first to reduce pain and inflammation. ? Steroid shots into inflamed joints to help reverse inflammation. ? Disease-modifying antirheumatic drugs (DMARDs). These may reduce symptoms and slow the progression of the disease. ? Biologic medicines. These may be used for moderate to severe forms of AS and when other treatments are not helping. These medicines are the most effective but may increase the risk for a serious infection.  Physical therapy and physical activity to help keep muscles strong and keep joints from getting stiff.  Surgery may be needed for severe joint damage, usually in the hip. This may require hip replacement surgery. Follow these instructions at home: Activity  Return to your normal activities as told by your health care provider. Ask your health care provider what activities are safe for you.  Try to get exercise every day. Exercise is very important when you have AS. If you have learned  physical therapy exercises, practice them as told by your physical therapist.  Take care to avoid falls. When appropriate, use a cane or walker. Remove area rugs from your home. General instructions  Take over-the-counter and prescription medicines only as told by your health care provider.  Maintain good posture when standing and sitting.  Maintain a healthy weight.  Wear shoes that are supportive and well fitting.  Do not use any products that contain nicotine or tobacco, such as cigarettes and e-cigarettes. These can make your condition worse. If you need help quitting, ask your health care provider.  Keep all follow-up visits as told by your health care provider. This is important. Where to find more information Learn as much as you can about AS. You can find support and information from the Spondylitis Association of America: www.spondylitis.org Contact a health care provider if:  You are on a biologic medicine and you have a fever or any other signs of infection.  You have side effects from any of your medicines.  Your symptoms are not improving with medicine or are getting worse. Get help right away if:  You have trouble breathing.  You have a sudden change in vision. Summary  Ankylosing spondylitis (AS) is a long-term condition that causes inflammation, often in the spine.  AS may be caused by abnormal genes that can be passed down through families.  The most common symptoms of AS are pain and stiffness that get worse with rest and better with movement.  There is no cure for AS, but treatment can control symptoms and slow progression of the disease.  Nonsteroidal anti-inflammatory drugs (NSAIDs) may be the first treatment used. These help reduce pain and inflammation. This information is not intended to replace advice given to you by your health care provider. Make sure you discuss any questions you have with your health care provider. Document Released: 08/09/2017  Document Revised: 08/09/2017 Document Reviewed: 08/09/2017 Elsevier Patient Education  2020 [place] are taking a medication(s) that can suppress your immune system.  The following immunizations are recommended: . Flu annually . Pneumonia (Pneumovax 23 and Prevnar 13 spaced at least 1 year apart)  Please check with your PCP to make sure you are up to date.

## 2019-04-17 NOTE — Telephone Encounter (Signed)
Submitted a Prior Authorization request to [facility] for [place] via Cover My Meds. Will update once we receive a response.

## 2019-04-17 NOTE — Telephone Encounter (Signed)
Please start BIV for Humira.  Diagnosis ankylosing spondylitis.  She is naive to therapy.   Mariella Saa, PharmD, [place], [place] Clinical Specialty Pharmacist [phone]  04/17/2019 3:31 PM

## 2019-04-17 NOTE — Progress Notes (Addendum)
Pharmacy Note Subjective: Patient presents today to the [facility] to see Dr. Estanislado Pandy.   Patient seen by the pharmacist for counseling on Humira for ankylosing spondylitis.  She has taken prescription strength naproxen and over the counter ibuprofen with inadequate response. She is naive to biologic treatment.  Objective:  CBC    Component Value Date/Time   WBC 6.4 04/10/2019 1509   RBC 4.88 04/10/2019 1509   HGB 13.4 04/10/2019 1509   HCT 40.5 04/10/2019 1509   PLT 423 (H) 04/10/2019 1509   MCV 83.0 04/10/2019 1509   MCH 27.5 04/10/2019 1509   MCHC 33.1 04/10/2019 1509   RDW 12.8 04/10/2019 1509   LYMPHSABS 1,372 02/06/2019 1420   MONOABS 0.4 09/13/2017 1002   EOSABS 110 02/06/2019 1420   BASOSABS 22 02/06/2019 1420     CMP     Component Value Date/Time   NA 137 02/06/2019 1420   K 4.4 02/06/2019 1420   CL 103 02/06/2019 1420   CO2 23 02/06/2019 1420   GLUCOSE 88 02/06/2019 1420   BUN 10 02/06/2019 1420   CREATININE 0.75 02/06/2019 1420   CALCIUM 9.6 02/06/2019 1420   PROT 7.1 02/06/2019 1420   ALBUMIN 4.3 11/14/2018 1052   AST 16 02/06/2019 1420   ALT 14 02/06/2019 1420   ALKPHOS 104 11/14/2018 1052   BILITOT 0.3 02/06/2019 1420      Baseline Immunosuppressant Therapy Labs TB GOLD Quantiferon TB Gold Latest Ref Rng & Units 04/10/2019  Quantiferon TB Gold Plus NEGATIVE NEGATIVE   Hepatitis Panel: pending 04/17/2019  HIV Lab Results  Component Value Date   HIV NONREACTIVE 07/27/2016   Immunoglobulins: pending 04/17/2019  SPEP: pending 04/17/2019  G6PD No results found for: G6PDH TPMT No results found for: TPMT   Chest x-ray: pending 04/17/2019  Does patient have diagnosis of heart failure?  No  Assessment/Plan:  Counseled patient that Humira is a TNF blocking agent.  Counseled patient on purpose, proper use, and adverse effects of Humira.  Reviewed the most common adverse effects including infections, headache, and injection site  reactions. Discussed that there is the possibility of an increased risk of malignancy but it is not well understood if this increased risk is due to the medication or the disease state.  Advised patient to get yearly dermatology exams due to risk of skin cancer. Counseled patient that Humira should be held prior to scheduled surgery.  Counseled patient to avoid live vaccines while on Humira.  Advised patient to get annual influenza vaccine and the pneumococcal vaccine as indicated.    Reviewed the importance of regular labs while on Humira therapy.  Standing orders placed.  Provided patient with medication education material and answered all questions.  Patient consented to Humira.  Will upload consent into the media tab.  Reviewed storage instructions of Humira.  Advised initial injection must be administered in office.  Patient verbalized understanding.  Dose will be for ankylosing spondylitis Humira 40 mg every 14 days.  Prescription pending lab results and/or insurance approval.  All questions encouraged and answered.  Instructed patient to call with any questions or concerns.   Mariella Saa, PharmD, [place], [place] Clinical Specialty Pharmacist [phone]  04/17/2019 3:29 PM

## 2019-04-18 ENCOUNTER — Ambulatory Visit (HOSPITAL_COMMUNITY)
Admission: RE | Admit: 2019-04-18 | Discharge: 2019-04-18 | Disposition: A | Discharge status: Home / Self Care | Payer: 59 | Source: Ambulatory Visit | Attending: Rheumatology | Admitting: Rheumatology

## 2019-04-18 DIAGNOSIS — Z79899 Other long term (current) drug therapy: Secondary | ICD-10-CM | POA: Insufficient documentation

## 2019-04-19 NOTE — Progress Notes (Signed)
Labs are normal.

## 2019-04-22 LAB — PROTEIN ELECTROPHORESIS, SERUM, WITH REFLEX
Albumin ELP: 4 g/dL (ref 3.8–4.8)
Alpha 1: 0.4 g/dL — ABNORMAL HIGH (ref 0.2–0.3)
Alpha 2: 0.8 g/dL (ref 0.5–0.9)
Beta 2: 0.4 g/dL (ref 0.2–0.5)
Beta Globulin: 0.5 g/dL (ref 0.4–0.6)
Gamma Globulin: 1.2 g/dL (ref 0.8–1.7)
Total Protein: 7.4 g/dL (ref 6.1–8.1)

## 2019-04-22 LAB — CBC WITH DIFFERENTIAL/PLATELET
Absolute Monocytes: 429 cells/uL (ref 200–950)
Basophils Absolute: 29 cells/uL (ref 0–200)
Basophils Relative: 0.5 %
Eosinophils Absolute: 70 cells/uL (ref 15–500)
Eosinophils Relative: 1.2 %
HCT: 42.6 % (ref 35.0–45.0)
Hemoglobin: 13.9 g/dL (ref 11.7–15.5)
Lymphs Abs: 1363 cells/uL (ref 850–3900)
MCH: 27.4 pg (ref 27.0–33.0)
MCHC: 32.6 g/dL (ref 32.0–36.0)
MCV: 83.9 fL (ref 80.0–100.0)
MPV: 10.5 fL (ref 7.5–12.5)
Monocytes Relative: 7.4 %
Neutro Abs: 3909 cells/uL (ref 1500–7800)
Neutrophils Relative %: 67.4 %
Platelets: 421 10*3/uL — ABNORMAL HIGH (ref 140–400)
RBC: 5.08 10*6/uL (ref 3.80–5.10)
RDW: 13 % (ref 11.0–15.0)
Total Lymphocyte: 23.5 %
WBC: 5.8 10*3/uL (ref 3.8–10.8)

## 2019-04-22 LAB — COMPLETE METABOLIC PANEL WITH GFR
AG Ratio: 1.5 (calc) (ref 1.0–2.5)
ALT: 14 U/L (ref 6–29)
AST: 17 U/L (ref 10–30)
Albumin: 4.5 g/dL (ref 3.6–5.1)
Alkaline phosphatase (APISO): 99 U/L (ref 31–125)
BUN: 9 mg/dL (ref 7–25)
CO2: 25 mmol/L (ref 20–32)
Calcium: 10.5 mg/dL — ABNORMAL HIGH (ref 8.6–10.2)
Chloride: 104 mmol/L (ref 98–110)
Creat: 0.77 mg/dL (ref 0.50–1.10)
GFR, Est African American: 126 mL/min/{1.73_m2} (ref >=60)
GFR, Est Non African American: 109 mL/min/{1.73_m2} (ref >=60)
Globulin: 3 g/dL (calc) (ref 1.9–3.7)
Glucose, Bld: 84 mg/dL (ref 65–99)
Potassium: 4.5 mmol/L (ref 3.5–5.3)
Sodium: 140 mmol/L (ref 135–146)
Total Bilirubin: 0.4 mg/dL (ref 0.2–1.2)
Total Protein: 7.5 g/dL (ref 6.1–8.1)

## 2019-04-22 LAB — IGG, IGA, IGM
IgG (Immunoglobin G), Serum: 1256 mg/dL (ref 600–1640)
IgM, Serum: 94 mg/dL (ref 50–300)
Immunoglobulin A: 247 mg/dL (ref 47–310)

## 2019-04-22 LAB — HEPATITIS PANEL, ACUTE
Hep A IgM: NONREACTIVE
Hep B C IgM: NONREACTIVE
Hepatitis B Surface Ag: NONREACTIVE
Hepatitis C Ab: NONREACTIVE
SIGNAL TO CUT-OFF: 0.01 (ref <1.00)

## 2019-04-22 LAB — IFE INTERPRETATION: Immunofix Electr Int: NOT DETECTED

## 2019-04-22 NOTE — Progress Notes (Signed)
Left voicemail for patient to return call to discuss results.

## 2019-04-23 ENCOUNTER — Encounter: Payer: Self-pay | Admitting: Pharmacist

## 2019-04-23 NOTE — Telephone Encounter (Signed)
Received a fax regarding Prior Authorization from [facility] for [place]. Authorization has been DENIED because no documentation of failure to two nonsteroidal anti-inflammatory drugs.  Will send document to scan center.  Will submit appeal.  Mariella Saa, PharmD, [place], [place] Clinical Specialty Pharmacist [phone]  04/23/2019 10:14 AM

## 2019-04-23 NOTE — Telephone Encounter (Signed)
Appeal letter and supporting chart notes faxed to [phone].  Will update when we receive a response.   Mariella Saa, PharmD, [place], [place] Clinical Specialty Pharmacist [phone]  04/23/2019 10:37 AM

## 2019-04-28 NOTE — Progress Notes (Signed)
Office Visit Note  Patient: Toni Livingston             Date of Birth: 04-23-1996           MRN: [id number]             PCP: Ma Hillock, DO Referring: Ma Hillock, DO Visit Date: 05/09/2019 Occupation: _0 @  Subjective:  Lower back pain.   History of Present Illness: Toni Livingston is a 23 y.o. female with history of ankylosing spondylitis.  She states she continues to have some lower back pain.  She describes pain over bilateral SI joints, sacrum and coccyx.  She states she also has discomfort and stiffness in her hands.  She has been having nocturnal pain and discomfort walking and dressing and doing routine activities.  Activities of Daily Living:  Patient reports morning stiffness for 1 hour.   Patient Reports nocturnal pain.  Difficulty dressing/grooming: Reports Difficulty climbing stairs: Denies Difficulty getting out of chair: Reports Difficulty using hands for taps, buttons, cutlery, and/or writing: Reports  Review of Systems  Constitutional: Positive for fatigue. Negative for night sweats, weight gain and weight loss.  HENT: Positive for mouth dryness and nose dryness. Negative for mouth sores, trouble swallowing and trouble swallowing.   Eyes: Negative for pain, redness, itching, visual disturbance and dryness.  Respiratory: Negative for cough, shortness of breath, wheezing and difficulty breathing.   Cardiovascular: Negative for chest pain, palpitations, hypertension, irregular heartbeat and swelling in legs/feet.  Gastrointestinal: Negative for abdominal pain, blood in stool, constipation and diarrhea.  Endocrine: Negative for increased urination.  Genitourinary: Negative for difficulty urinating and vaginal dryness.  Musculoskeletal: Positive for arthralgias, joint pain and morning stiffness. Negative for joint swelling, myalgias, muscle weakness, muscle tenderness and myalgias.  Skin: Negative for color change, rash, hair loss, skin tightness, ulcers and  sensitivity to sunlight.  Allergic/Immunologic: Negative for susceptible to infections.  Neurological: Positive for headaches. Negative for dizziness, light-headedness, numbness, memory loss, night sweats and weakness.       Allergies  Hematological: Negative for bruising/bleeding tendency and swollen glands.  Psychiatric/Behavioral: Negative for depressed mood, confusion and sleep disturbance. The patient is not nervous/anxious.     PMFS History:  Patient Active Problem List   Diagnosis Date Noted  . HLA B27 (HLA B27 positive) 04/16/2019  . ANA positive 04/16/2019  . Iritis 04/10/2019  . DDD (degenerative disc disease), lumbar 04/10/2019  . Vaginal high risk HPV DNA test positive 11/19/2018  . Obesity (BMI 30-39.9) 11/14/2018  . Pilonidal cyst 11/14/2018  . LGSIL of cervix of undetermined significance 11/14/2018  . Irregular menses 11/14/2018  . Encounter for well woman exam with routine gynecological exam 09/13/2017  . Oral contraceptive use 07/27/2016  . Encounter for long-term current use of medication 07/27/2016    Past Medical History:  Diagnosis Date  . Vertigo     Family History  Problem Relation Age of Onset  . Hypertension Mother   . Testicular cancer Father   . Healthy Sister   . Healthy Brother   . Uterine cancer Maternal Aunt   . Healthy Brother   . Depression Sister   . Anxiety disorder Sister    Past Surgical History:  Procedure Laterality Date  . BREAST REDUCTION SURGERY  2015   Social History   Social History Narrative   Single. Some college.    Works as Therapist, nutritional.    Drinks caffeine.    Wears seatbelt. Smoke detector in the home.  Exercises routinely.    Feels safe in relationships.       Immunization History  Administered Date(s) Administered  . Influenza,inj,Quad PF,6+ Mos 03/09/2017, 03/05/2018, 02/27/2019  . Influenza-Unspecified 03/20/2016  . PPD Test 07/27/2016  . Tdap 07/27/2016     Objective: Vital Signs: BP 116/74  (BP Location: Left Arm, Patient Position: Sitting, Cuff Size: Normal)   Pulse 85   Resp 13   Ht _0  (1.727 m)   Wt 209 lb 12.8 oz (95.2 kg)   BMI 31.90 kg/m    Physical Exam Vitals signs and nursing note reviewed.  Constitutional:      Appearance: She is well-developed.  HENT:     Head: Normocephalic and atraumatic.  Eyes:     Conjunctiva/sclera: Conjunctivae normal.  Neck:     Musculoskeletal: Normal range of motion.  Cardiovascular:     Rate and Rhythm: Normal rate and regular rhythm.     Heart sounds: Normal heart sounds.  Pulmonary:     Effort: Pulmonary effort is normal.     Breath sounds: Normal breath sounds.  Abdominal:     General: Bowel sounds are normal.     Palpations: Abdomen is soft.  Lymphadenopathy:     Cervical: No cervical adenopathy.  Skin:    General: Skin is warm and dry.     Capillary Refill: Capillary refill takes less than 2 seconds.  Neurological:     Mental Status: She is alert and oriented to person, place, and time.  Psychiatric:        Behavior: Behavior normal.      Musculoskeletal Exam: C-spine was in good range of motion.  She has discomfort with range of motion of her thoracic spine.  She has limited range of motion of her lumbar spine.  She has tenderness on palpation of bilateral SI joint, sacrum and coccyx.  Shoulder joints, elbow joints, wrist joints, MCPs, PIPs and DIPs with good range of motion with no synovitis.  Hip joints, knee joints, ankles, MTPs and PIPs with good range of motion with no synovitis.  CDAI Exam: CDAI Score: 1  Patient Global: 5 mm; Provider Global: 5 mm Swollen: 0 ; Tender: 2  Joint Exam      Right  Left  Sacroiliac   Tender   Tender     Investigation: No additional findings.  Imaging: Dg Chest 2 View  Result Date: 04/18/2019 CLINICAL DATA:  Ankylosing spondylitis. EXAM: CHEST - 2 VIEW COMPARISON:  None. FINDINGS: The heart size and mediastinal contours are within normal limits. Both lungs are  clear. The visualized skeletal structures are unremarkable. IMPRESSION: Normal chest x-rays. Electronically Signed   By: Davina Poke M.D.   On: 04/18/2019 17:10   Xr Lumbar Spine 2-3 Views  Result Date: 04/17/2019 No joint space narrowing was noted.  No syndesmophytes were noted.  No significant facet joint arthropathy was noted. Impression: Unremarkable x-ray of the lumbar spine.  Xr Pelvis 1-2 Views  Result Date: 04/17/2019 Bilateral SI joint to sclerosis was noted.  No joint space narrowing was noted. Impression: These findings are consistent with bilateral sacroiliitis.   Recent Labs: Lab Results  Component Value Date   WBC 5.8 04/17/2019   HGB 13.9 04/17/2019   PLT 421 (H) 04/17/2019   NA 140 04/17/2019   K 4.5 04/17/2019   CL 104 04/17/2019   CO2 25 04/17/2019   GLUCOSE 84 04/17/2019   BUN 9 04/17/2019   CREATININE 0.77 04/17/2019   BILITOT 0.4 04/17/2019  ALKPHOS 104 11/14/2018   AST 17 04/17/2019   ALT 14 04/17/2019   PROT 7.5 04/17/2019   PROT 7.4 04/17/2019   ALBUMIN 4.3 11/14/2018   CALCIUM 10.5 (H) 04/17/2019   GFRAA 126 04/17/2019   QFTBGOLDPLUS NEGATIVE 04/10/2019  April 17, 2019 SPEP nonspecific, immunoglobulins normal, hepatitis B-, hepatitis C negative,  04/10/2019: ANA 1:80 Mitotic, intercellular bridge, 1:80 NH, ESR 22, RNP negative, HLA-B27 positive, Ace 26, RF<14, TB gold negative, Ro-, La-, RNP-, Scl-70-, dsDNA-  July 27, 2016 HIV negative  Speciality Comments: No specialty comments available.  Procedures:  No procedures performed Allergies: Patient has no known allergies.   Assessment / Plan:     Visit Diagnoses: Ankylosing spondylitis of sacral region [facility]) - History of chronic SI joint pain, SI joint sclerosis on the x-rays, HLA-B27 positive -patient received her first Humira injection today.  She was observed in the office for 30 minutes and tolerated the medication well.  She will be taking Humira injections at home every other  week.  Plan: Adalimumab (HUMIRA PEN) 40 MG/0.4ML PNKT, Adalimumab (HUMIRA PEN) 40 MG/0.4ML PNKT, DISCONTINUED: Adalimumab (HUMIRA PEN) 40 MG/0.4ML PNKT  Chronic midline low back pain without sciatica - X-rays were unremarkable.  Iritis - History of recurrent iritis since 2016.  HLA B27 positive  High risk medication use - Consent was taken on Humira at the last visit. - Plan: CBC with Differential/Platelet, COMPLETE METABOLIC PANEL WITH GFR  ANA positive  Oral contraceptive use  Vaginal high risk HPV DNA test positive  Pilonidal cyst  Orders: Orders Placed This Encounter  Procedures  . CBC with Differential/Platelet  . COMPLETE METABOLIC PANEL WITH GFR   Meds ordered this encounter  Medications  . DISCONTD: Adalimumab (HUMIRA PEN) 40 MG/0.4ML PNKT    Sig: Inject 40 mg into the skin every 14 (fourteen) days.    Dispense:  3 each    Refill:  0    3 kits-6 pens  . Adalimumab (HUMIRA PEN) 40 MG/0.4ML PNKT    Sig: Inject 40 mg into the skin every 14 (fourteen) days.    Dispense:  3 each    Refill:  0    3 kits-6 pens  . Adalimumab (HUMIRA PEN) 40 MG/0.4ML PNKT    Sig: Inject 40 mg into the skin once for 1 dose.    Dispense:  1 each    Refill:  0    First dose given in office.    .  Follow-Up Instructions: Return in about 4 weeks (around 06/06/2019).   Bo Merino, MD  Note - This record has been created using Editor, commissioning.  Chart creation errors have been sought, but may not always  have been located. Such creation errors do not reflect on  the standard of medical care.

## 2019-04-28 NOTE — Telephone Encounter (Signed)
Patient was diagnosed with colitis at the last visit.  She has severe sacroiliitis.  She has had several courses of prednisone for iritis.  Her symptoms are started in 2016.  She has had several courses of anti-inflammatories.  I noticed that the insurance is requiring documentation of 2 NSAIDs which has been used.  Please contact patient to get details about the NSAID use.

## 2019-04-30 NOTE — Telephone Encounter (Signed)
Called to check status of Humira Appeal, rep Sam provided me with an alternate fax line to submit the appeal- [phone]. Rep advised turnaround time is 4 business days.  11:33 AM Beatriz Chancellor, CPhT

## 2019-05-07 NOTE — Telephone Encounter (Signed)
Called [employer] to check status of appeal for Humira.   Received notification from [facility] regarding a prior authorization for [place]. Authorization has been APPROVED from 05/01/19 to 04/30/20.   Will send document to scan center, once received .  Phone # [phone]  Per plan, patient must fill through [facility]. Patient is copay card eligible.  10:52 AM Beatriz Chancellor, CPhT

## 2019-05-07 NOTE — Telephone Encounter (Signed)
Left voicemail to notify patient of approval.  She has an appointment on Thursday.  We can start Humira then.  Mariella Saa, PharmD, [place], [place] Clinical Specialty Pharmacist [phone]  05/07/2019 10:59 AM

## 2019-05-08 ENCOUNTER — Telehealth: Payer: Self-pay | Admitting: Family Medicine

## 2019-05-08 NOTE — Telephone Encounter (Signed)
Pt called requesting RX for pneumonia vaccine be sent to the pharmacy for her.   [facility] #[id number] - [place], [place] (Phone) [phone] (Fax)

## 2019-05-08 NOTE — Telephone Encounter (Signed)
Called patient and she doesn't know if she has had vaccine in the past. Patient is needing vaccine before starting humira prescribed by her rheumatologist. Patient is not from [place]. She does not listed on [facility]. Please advise, thank you

## 2019-05-08 NOTE — Telephone Encounter (Signed)
Pt was called and given information. She states she has spoken with her mom and her mom does believe she has had this vaccine when she was a child. She has a Rheumatology appt tomorrow and will call back after speaking with her.

## 2019-05-08 NOTE — Telephone Encounter (Signed)
Patient requesting a pneumococcal vaccine to be sent to pharmacy prior to starting her Humira through her rheumatologist for her arthritic condition. -Pneumonia vaccines are not called into the pharmacy.  These are provided in office.  For people starting a medication such as Humira it is a 2 part series.  The Prevnar is completed first, followed by the Pneumovax in 8 weeks.   As a courtesy, we can place her on the nurse schedule to have the Prevnar completed ASAP, and she will need the Pneumovax in 8 weeks.  She will need to speak to her rheumatologist concerning if she can start taking Humira prior to receiving the entire series.

## 2019-05-09 ENCOUNTER — Ambulatory Visit: Payer: 59 | Admitting: Rheumatology

## 2019-05-09 ENCOUNTER — Encounter: Payer: Self-pay | Admitting: Rheumatology

## 2019-05-09 ENCOUNTER — Other Ambulatory Visit: Payer: Self-pay

## 2019-05-09 VITALS — BP 120/75 | HR 85 | Resp 13 | Ht 68.0 in | Wt 209.8 lb

## 2019-05-09 DIAGNOSIS — L0591 Pilonidal cyst without abscess: Secondary | ICD-10-CM

## 2019-05-09 DIAGNOSIS — R768 Other specified abnormal immunological findings in serum: Secondary | ICD-10-CM

## 2019-05-09 DIAGNOSIS — Z79899 Other long term (current) drug therapy: Secondary | ICD-10-CM

## 2019-05-09 DIAGNOSIS — H209 Unspecified iridocyclitis: Secondary | ICD-10-CM

## 2019-05-09 DIAGNOSIS — Z1589 Genetic susceptibility to other disease: Secondary | ICD-10-CM

## 2019-05-09 DIAGNOSIS — M458 Ankylosing spondylitis sacral and sacrococcygeal region: Secondary | ICD-10-CM | POA: Diagnosis not present

## 2019-05-09 DIAGNOSIS — Z3041 Encounter for surveillance of contraceptive pills: Secondary | ICD-10-CM

## 2019-05-09 DIAGNOSIS — R7689 Other specified abnormal immunological findings in serum: Secondary | ICD-10-CM

## 2019-05-09 DIAGNOSIS — M545 Low back pain: Secondary | ICD-10-CM

## 2019-05-09 DIAGNOSIS — G8929 Other chronic pain: Secondary | ICD-10-CM

## 2019-05-09 DIAGNOSIS — R87811 Vaginal high risk human papillomavirus (HPV) DNA test positive: Secondary | ICD-10-CM

## 2019-05-09 MED ORDER — HUMIRA (2 PEN) 40 MG/0.4ML ~~LOC~~ AJKT: 40.0000 mg | AUTO-INJECTOR | SUBCUTANEOUS | 0 refills | Status: DC

## 2019-05-09 MED ORDER — HUMIRA (2 PEN) 40 MG/0.4ML ~~LOC~~ AJKT: 40.0000 mg | AUTO-INJECTOR | Freq: Once | SUBCUTANEOUS | 0 refills | Status: DC

## 2019-05-09 NOTE — Progress Notes (Signed)
Pharmacy Note  Subjective:   Patient is being initiated on Humira.  Patient was previously counseled extensively on and consented to initiation of Humira at that time.  Patient presents to clinic today to receive the first dose of Humira.     Objective: CMP     Component Value Date/Time   NA 140 04/17/2019 1535   K 4.5 04/17/2019 1535   CL 104 04/17/2019 1535   CO2 25 04/17/2019 1535   GLUCOSE 84 04/17/2019 1535   BUN 9 04/17/2019 1535   CREATININE 0.77 04/17/2019 1535   CALCIUM 10.5 (H) 04/17/2019 1535   PROT 7.5 04/17/2019 1535   PROT 7.4 04/17/2019 1535   ALBUMIN 4.3 11/14/2018 1052   AST 17 04/17/2019 1535   ALT 14 04/17/2019 1535   ALKPHOS 104 11/14/2018 1052   BILITOT 0.4 04/17/2019 1535   GFRNONAA 109 04/17/2019 1535   GFRAA 126 04/17/2019 1535    CBC    Component Value Date/Time   WBC 5.8 04/17/2019 1535   RBC 5.08 04/17/2019 1535   HGB 13.9 04/17/2019 1535   HCT 42.6 04/17/2019 1535   PLT 421 (H) 04/17/2019 1535   MCV 83.9 04/17/2019 1535   MCH 27.4 04/17/2019 1535   MCHC 32.6 04/17/2019 1535   RDW 13.0 04/17/2019 1535   LYMPHSABS 1,363 04/17/2019 1535   MONOABS 0.4 09/13/2017 1002   EOSABS 70 04/17/2019 1535   BASOSABS 29 04/17/2019 1535    Baseline Immunosuppressant Therapy Labs TB GOLD Quantiferon TB Gold Latest Ref Rng & Units 04/10/2019  Quantiferon TB Gold Plus NEGATIVE NEGATIVE   Hepatitis Panel Hepatitis Latest Ref Rng & Units 04/17/2019  Hep B Surface Ag NON-REACTI NON-REACTIVE  Hep B IgM NON-REACTI NON-REACTIVE  Hep C Ab NON-REACTI NON-REACTIVE  Hep C Ab NON-REACTI NON-REACTIVE  Hep A IgM NON-REACTI NON-REACTIVE   HIV Lab Results  Component Value Date   HIV NONREACTIVE 07/27/2016   Immunoglobulins Immunoglobulin Electrophoresis Latest Ref Rng & Units 04/17/2019  IgA  47 - 310 mg/dL 247  IgG 600 - 1,640 mg/dL 1,256  IgM 50 - 300 mg/dL 94   SPEP Serum Protein Electrophoresis Latest Ref Rng & Units 04/17/2019  Total Protein 6.1 -  8.1 g/dL 7.4  Albumin 3.8 - 4.8 g/dL 4.0  Alpha-1 0.2 - 0.3 g/dL 0.4(H)  Alpha-2 0.5 - 0.9 g/dL 0.8  Beta Globulin 0.4 - 0.6 g/dL 0.5  Beta 2 0.2 - 0.5 g/dL 0.4  Gamma Globulin 0.8 - 1.7 g/dL 1.2   G6PD No results found for: G6PDH TPMT No results found for: TPMT   Chest x-ray: normal 04/18/2019  Patient running a fever or have signs/symptoms of infection? No  Assessment/Plan:  Demonstrated proper injection technique with Humira demo pen.  Patient able to demonstrate proper injection technique using the teach back method.  Patient self injected in the right upper thighwith:  Sample Medication: Humira 40 mg/0.5ml NDC: [id number] Lot: [id number] Expiration: 11/2019  Patient tolerated well.  Observed for 30 mins in office for adverse reaction and none noted.   Patient is to return in 1 month for labs.  Standing orders placed. Prescription sent to [facility]  per patient's request.  All questions encouraged and answered.  Instructed patient to call with any further questions or concerns.  Mariella Saa, PharmD, [place], [place] Clinical Specialty Pharmacist [phone]  05/09/2019 2:54 PM

## 2019-05-09 NOTE — Patient Instructions (Addendum)
*  Your next dose of Humira will be on Thursday 12/3.*  Standing Labs We placed an order today for your standing lab work.    Please come back and get your standing labs in 1 month and then every 3 months.  We have open lab daily Monday through Thursday from 8:30-12:30 PM and 1:30-4:30 PM and Friday from 8:30-12:30 PM and 1:30-4:00 PM at the office of Dr. Bo Merino.   You may experience shorter wait times on Monday and Friday afternoons. The office is located at [address] [place], [place], [place] [address] No appointment is necessary.   Labs are drawn by [facility].  You may receive a bill from [facility] for your lab work.  If you wish to have your labs drawn at another location, please call the office 24 hours in advance to send orders.  If you have any questions regarding directions or hours of operation,  please call [phone].   Just as a reminder please drink plenty of water prior to coming for your lab work. Thanks!  Helpful Tips for Injecting To help alleviate pain and injection site reactions consider the following tips:  . Placing something cold (like and ice gel pack or cold water bottle) on the injection site just before cleansing with alcohol may help reduce pain . If you have a localized reaction (redness, mild swelling, warmth, and itching) you can use topical corticosteroids (hydrocortisone cream) or antihistamine (Claritin) to help minimize reaction the day of, the day before, and the day after injecting . Always inject this medication at room temperature (remove from refrigerator 15-20 minutes before injecting; this may help eliminate stinging). . Always rotate your injection sites using inside/outside thigh of both legs, abdomen divided into 4 quadrants (stay away from waist line, and 2" away from navel). . Do not inject into areas where skin is tender, bruised, red, or hard or where there are scars or stretch marks. . Always let alcohol dry on the skin  before injecting. . Place a cold, damp towel or small ice pack on the injection site for 10 or 15 minutes every 1 to 2 hours if it hurts or is swollen.

## 2019-06-04 NOTE — Progress Notes (Signed)
Virtual Visit via Telephone Note  I connected with Toni Livingston on 06/05/19 at  3:15 PM EST by telephone and verified that I am speaking with the correct person using two identifiers.  Location: Patient: Home  Provider: Clinic  This service was conducted via virtual visit.  The patient was located at home. I was located in my office.  Consent was obtained prior to the virtual visit and is aware of possible charges through their insurance for this visit.  The patient is an established patient.  Dr. Estanislado Pandy, MD conducted the virtual visit and Hazel Sams, PA-C acted as scribe during the service.  Office staff helped with scheduling follow up visits after the service was conducted.     I discussed the limitations, risks, security and privacy concerns of performing an evaluation and management service by telephone and the availability of in person appointments. I also discussed with the patient that there may be a patient responsible charge related to this service. The patient expressed understanding and agreed to proceed.  CC: Medication monitoring  History of Present Illness: Patient is a 23 year old female with a past medical history of ankylosing spondylitis and iritis.  She is on Humira 40 mg sq injections every 14 days.  She is due for her next injection tomorrow. She denies any joint pain or joint swelling currently.  Her morning stiffness and nocturnal pain has resolved. She states the SI joint pain has resolved.  She has not had any iritis flares.    Review of Systems  Constitutional: Negative for fever and malaise/fatigue.  Eyes: Negative for photophobia, pain, discharge and redness.  Respiratory: Negative for cough, shortness of breath and wheezing.   Cardiovascular: Negative for chest pain and palpitations.  Gastrointestinal: Negative for blood in stool, constipation and diarrhea.  Genitourinary: Negative for dysuria.  Musculoskeletal: Negative for back pain, joint pain, myalgias  and neck pain.  Skin: Negative for rash.  Neurological: Negative for dizziness and headaches.  Psychiatric/Behavioral: Negative for depression. The patient is not nervous/anxious and does not have insomnia.       Observations/Objective: Physical Exam  Constitutional: She is oriented to person, place, and time.  Neurological: She is alert and oriented to person, place, and time.  Psychiatric: Mood, memory, affect and judgment normal.   Patient reports morning stiffness for 0  minutes.   Patient denies nocturnal pain.  Difficulty dressing/grooming: Denies Difficulty climbing stairs: Denies Difficulty getting out of chair: Denies Difficulty using hands for taps, buttons, cutlery, and/or writing: Denies    Assessment and Plan: Visit Diagnoses: Ankylosing spondylitis of sacral region [facility]) - History of chronic SI joint pain, SI joint sclerosis on the x-rays, HLA-B27 positive: She is clinically doing well since starting on Humira 40 mg sq injections every 14 days on 05/09/19. She has not had any signs or symptoms of a flare.  She has no joint pain or joint inflammation at this time.  No morning stiffness.  Her SI joint pain has resolved.  She is no longer experiencing nocturnal pain.  She has not had any recurrence of iritis.  She will continue injecting Humira 40 mg sq injections every 14 days.  Her 3rd injection is due tomorrow.  She was advised to notify us if she develops increased joint pain or joint swelling.  She will follow up in 3-4 months.    High risk medication use - Humira 40 mg sq injections every 14 days (started on 05/09/19).  She is due to update lab  work 1 month after starting on Humira and then every 3 months.  Standing orders are in place.      Chronic midline low back pain without sciatica - X-rays were unremarkable.She is not having any lower back pain currently.  No morning stiffness.    Iritis - History of recurrent iritis since 2016.  She has not had any signs or  symptoms of an iritis flare.  She is not having any eye dryness, pain, or redness at this time.   HLA B27 positive  Chronic SI joint pain: Resolved   ANA positive: She has no other clinical features of autoimmune disease.    Other medical conditions are listed as follows:   Oral contraceptive use  Vaginal high risk HPV DNA test positive  Pilonidal cyst   Follow Up Instructions: She will follow up in 3-4 months.    I discussed the assessment and treatment plan with the patient. The patient was provided an opportunity to ask questions and all were answered. The patient agreed with the plan and demonstrated an understanding of the instructions.   The patient was advised to call back or seek an in-person evaluation if the symptoms worsen or if the condition fails to improve as anticipated.  I provided 15 minutes of non-face-to-face time during this encounter.   Bo Merino, MD   Scribed by-  Hazel Sams, PA-C

## 2019-06-05 ENCOUNTER — Telehealth (INDEPENDENT_AMBULATORY_CARE_PROVIDER_SITE_OTHER): Payer: 59 | Admitting: Rheumatology

## 2019-06-05 ENCOUNTER — Encounter: Payer: Self-pay | Admitting: Rheumatology

## 2019-06-05 ENCOUNTER — Other Ambulatory Visit: Payer: Self-pay

## 2019-06-05 DIAGNOSIS — Z79899 Other long term (current) drug therapy: Secondary | ICD-10-CM

## 2019-06-05 DIAGNOSIS — G8929 Other chronic pain: Secondary | ICD-10-CM

## 2019-06-05 DIAGNOSIS — L0591 Pilonidal cyst without abscess: Secondary | ICD-10-CM

## 2019-06-05 DIAGNOSIS — M458 Ankylosing spondylitis sacral and sacrococcygeal region: Secondary | ICD-10-CM | POA: Diagnosis not present

## 2019-06-05 DIAGNOSIS — Z1589 Genetic susceptibility to other disease: Secondary | ICD-10-CM | POA: Diagnosis not present

## 2019-06-05 DIAGNOSIS — M533 Sacrococcygeal disorders, not elsewhere classified: Secondary | ICD-10-CM

## 2019-06-05 DIAGNOSIS — Z3041 Encounter for surveillance of contraceptive pills: Secondary | ICD-10-CM

## 2019-06-05 DIAGNOSIS — R87811 Vaginal high risk human papillomavirus (HPV) DNA test positive: Secondary | ICD-10-CM

## 2019-06-05 DIAGNOSIS — H209 Unspecified iridocyclitis: Secondary | ICD-10-CM

## 2019-06-05 DIAGNOSIS — R768 Other specified abnormal immunological findings in serum: Secondary | ICD-10-CM

## 2019-06-06 ENCOUNTER — Ambulatory Visit: Payer: 59 | Admitting: Rheumatology

## 2019-06-06 LAB — COMPLETE METABOLIC PANEL WITH GFR
AG Ratio: 1.4 (calc) (ref 1.0–2.5)
ALT: 12 U/L (ref 6–29)
AST: 14 U/L (ref 10–30)
Albumin: 4.1 g/dL (ref 3.6–5.1)
Alkaline phosphatase (APISO): 89 U/L (ref 31–125)
BUN: 11 mg/dL (ref 7–25)
CO2: 25 mmol/L (ref 20–32)
Calcium: 9.5 mg/dL (ref 8.6–10.2)
Chloride: 103 mmol/L (ref 98–110)
Creat: 0.73 mg/dL (ref 0.50–1.10)
GFR, Est African American: 134 mL/min/{1.73_m2} (ref >=60)
GFR, Est Non African American: 115 mL/min/{1.73_m2} (ref >=60)
Globulin: 3 g/dL (calc) (ref 1.9–3.7)
Glucose, Bld: 94 mg/dL (ref 65–99)
Potassium: 4 mmol/L (ref 3.5–5.3)
Sodium: 138 mmol/L (ref 135–146)
Total Bilirubin: 0.3 mg/dL (ref 0.2–1.2)
Total Protein: 7.1 g/dL (ref 6.1–8.1)

## 2019-06-06 LAB — CBC WITH DIFFERENTIAL/PLATELET
Absolute Monocytes: 486 cells/uL (ref 200–950)
Basophils Absolute: 42 cells/uL (ref 0–200)
Basophils Relative: 0.7 %
Eosinophils Absolute: 162 cells/uL (ref 15–500)
Eosinophils Relative: 2.7 %
HCT: 42.5 % (ref 35.0–45.0)
Hemoglobin: 13.9 g/dL (ref 11.7–15.5)
Lymphs Abs: 1632 cells/uL (ref 850–3900)
MCH: 27.6 pg (ref 27.0–33.0)
MCHC: 32.7 g/dL (ref 32.0–36.0)
MCV: 84.5 fL (ref 80.0–100.0)
MPV: 9.9 fL (ref 7.5–12.5)
Monocytes Relative: 8.1 %
Neutro Abs: 3678 cells/uL (ref 1500–7800)
Neutrophils Relative %: 61.3 %
Platelets: 413 10*3/uL — ABNORMAL HIGH (ref 140–400)
RBC: 5.03 10*6/uL (ref 3.80–5.10)
RDW: 13.9 % (ref 11.0–15.0)
Total Lymphocyte: 27.2 %
WBC: 6 10*3/uL (ref 3.8–10.8)

## 2019-06-12 ENCOUNTER — Telehealth: Payer: Self-pay | Admitting: Rheumatology

## 2019-06-12 NOTE — Telephone Encounter (Signed)
-----   Message from Shona Needles, RT sent at 06/05/2019  2:50 PM EST ----- Regarding: 3-4 MONTH F/U

## 2019-06-12 NOTE — Telephone Encounter (Signed)
LMOM for patient to call and schedule 3-4 month follow-up appointment.

## 2019-06-26 ENCOUNTER — Encounter: Payer: Self-pay | Admitting: Rheumatology

## 2019-06-26 NOTE — Telephone Encounter (Signed)
I returned patient's call.  She has been on Humira for about 8 weeks.  I advised her to schedule an appointment with Dr. Rachael Fee office.

## 2019-07-19 DIAGNOSIS — S62308A Unspecified fracture of other metacarpal bone, initial encounter for closed fracture: Secondary | ICD-10-CM

## 2019-07-19 HISTORY — DX: Unspecified fracture of other metacarpal bone, initial encounter for closed fracture: S62.308A

## 2019-07-24 ENCOUNTER — Other Ambulatory Visit: Payer: Self-pay | Admitting: Rheumatology

## 2019-07-24 DIAGNOSIS — M458 Ankylosing spondylitis sacral and sacrococcygeal region: Secondary | ICD-10-CM

## 2019-07-24 HISTORY — PX: HAND SURGERY: SHX662

## 2019-07-24 NOTE — Telephone Encounter (Signed)
Last Visit: 06/05/2019 telemedicine  Next Visit: message sent to the front desk to schedule.  Labs: 06/05/2019 Plts are elevated but trending down. Rest of CBC WNL. CMP WNL.  TB Gold:04/10/2019 negative   Okay to refill per Dr. Estanislado Pandy.

## 2019-07-31 ENCOUNTER — Other Ambulatory Visit: Payer: Self-pay | Admitting: Rheumatology

## 2019-07-31 DIAGNOSIS — M458 Ankylosing spondylitis sacral and sacrococcygeal region: Secondary | ICD-10-CM

## 2019-10-03 NOTE — Progress Notes (Signed)
Office Visit Note  Patient: Toni Livingston             Date of Birth: Dec 05, 1995           MRN: [id number]             PCP: Ma Hillock, DO Referring: Ma Hillock, DO Visit Date: 10/09/2019 Occupation: _0 @  Subjective:  Discuss medications   History of Present Illness: Toni Livingston is a 24 y.o. female with history of ankylosing spondylitis and iritis.  She is on Humira 40 mg sq injections every 14 days.  According to the patient she has had 2 iritis flares since starting on Humira.  She states she had new onset iritis flare in the right eye, which started 2 weeks ago. She was evaluated by her ophthalmologist on 10/03/19 and she was started on durezol, which has been improving her symptoms.  Her last flare in the left eye was in January 2021.   She has been experiencing increased bilateral SI joint pain. She denies any morning stiffness.   She states in January she fractured her left 4th metacarpal and had to have surgery.  She is currently going to physical therapy and is still unable to make a complete fist.  She has been under increased stress due to paying for the expenses from surgery.      Activities of Daily Living:  Patient reports morning stiffness for 0 minutes.   Patient Reports nocturnal pain.  Difficulty dressing/grooming: Denies Difficulty climbing stairs: Denies Difficulty getting out of chair: Reports Difficulty using hands for taps, buttons, cutlery, and/or writing: Denies  Review of Systems  Constitutional: Negative for fatigue.  HENT: Negative for mouth sores, mouth dryness and nose dryness.   Eyes: Positive for photophobia, pain and redness. Negative for itching, visual disturbance and dryness.  Respiratory: Negative for cough, hemoptysis, shortness of breath and difficulty breathing.   Cardiovascular: Negative for chest pain, palpitations, hypertension and swelling in legs/feet.  Gastrointestinal: Negative for blood in stool, constipation and  diarrhea.  Endocrine: Negative for increased urination.  Genitourinary: Negative for difficulty urinating and painful urination.  Musculoskeletal: Positive for arthralgias and joint pain. Negative for joint swelling, myalgias, muscle weakness, morning stiffness, muscle tenderness and myalgias.  Skin: Negative for color change, pallor, rash, hair loss, nodules/bumps, redness, skin tightness, ulcers and sensitivity to sunlight.  Allergic/Immunologic: Negative for susceptible to infections.  Neurological: Positive for headaches. Negative for dizziness, numbness, memory loss and weakness.  Hematological: Negative for bruising/bleeding tendency and swollen glands.  Psychiatric/Behavioral: Negative for depressed mood, confusion and sleep disturbance. The patient is not nervous/anxious.     PMFS History:  Patient Active Problem List   Diagnosis Date Noted  . HLA B27 (HLA B27 positive) 04/16/2019  . ANA positive 04/16/2019  . Iritis 04/10/2019  . DDD (degenerative disc disease), lumbar 04/10/2019  . Vaginal high risk HPV DNA test positive 11/19/2018  . Obesity (BMI 30-39.9) 11/14/2018  . Pilonidal cyst 11/14/2018  . LGSIL of cervix of undetermined significance 11/14/2018  . Irregular menses 11/14/2018  . Encounter for well woman exam with routine gynecological exam 09/13/2017  . Oral contraceptive use 07/27/2016  . Encounter for long-term current use of medication 07/27/2016    Past Medical History:  Diagnosis Date  . Ankylosing spondylitis ([place])   . Iritis   . Vertigo     Family History  Problem Relation Age of Onset  . Hypertension Mother   . Testicular cancer Father   . Healthy  Sister   . Healthy Brother   . Uterine cancer Maternal Aunt   . Healthy Brother   . Depression Sister   . Anxiety disorder Sister    Past Surgical History:  Procedure Laterality Date  . BREAST REDUCTION SURGERY  2015  . HAND SURGERY Left 07/24/2019   Social History   Social History Narrative    Single. Some college.    Works as Therapist, nutritional.    Drinks caffeine.    Wears seatbelt. Smoke detector in the home.    Exercises routinely.    Feels safe in relationships.       Immunization History  Administered Date(s) Administered  . Influenza,inj,Quad PF,6+ Mos 03/09/2017, 03/05/2018, 02/27/2019  . Influenza-Unspecified 03/20/2016  . PPD Test 07/27/2016  . Tdap 07/27/2016     Objective: Vital Signs: BP 108/75 (BP Location: Left Arm, Patient Position: Sitting, Cuff Size: Normal)   Pulse 83   Resp 16   Ht 5' 8" (1.727 m)   Wt 217 lb (98.4 kg)   BMI 32.99 kg/m    Physical Exam Vitals and nursing note reviewed.  Constitutional:      Appearance: She is well-developed.  HENT:     Head: Normocephalic and atraumatic.  Eyes:     Conjunctiva/sclera: Conjunctivae normal.  Pulmonary:     Effort: Pulmonary effort is normal.  Abdominal:     General: Bowel sounds are normal.     Palpations: Abdomen is soft.  Musculoskeletal:     Cervical back: Normal range of motion.  Lymphadenopathy:     Cervical: No cervical adenopathy.  Skin:    General: Skin is warm and dry.     Capillary Refill: Capillary refill takes less than 2 seconds.  Neurological:     Mental Status: She is alert and oriented to person, place, and time.  Psychiatric:        Behavior: Behavior normal.      Musculoskeletal Exam: C-spine, thoracic spine, and lumbar spine good ROM.  Tenderness over bilateral SI joints.  No midline spinal tenderness.Shoulder joints, elbow joints, wrist joints good ROM with no discomfort. Incomplete left fist formation. Decreased flexion of left 4th and 5th MCP and PIPs.  No synovitis was noted. Hip joints, knee joints, ankle joints, MTPs, PIPs, and DIPs good ROM with no synovitis.  No warmth or effusion of knee joints.  No tenderness or swelling of ankle joints.  No achilles tendonitis or plantar fasciitis.    CDAI Exam: CDAI Score: -- Patient Global: --; Provider Global:  -- Swollen: --; Tender: -- Joint Exam 10/09/2019   No joint exam has been documented for this visit   There is currently no information documented on the homunculus. Go to the Rheumatology activity and complete the homunculus joint exam.  Investigation: No additional findings.  Imaging: No results found.  Recent Labs: Lab Results  Component Value Date   WBC 6.0 06/05/2019   HGB 13.9 06/05/2019   PLT 413 (H) 06/05/2019   NA 138 06/05/2019   K 4.0 06/05/2019   CL 103 06/05/2019   CO2 25 06/05/2019   GLUCOSE 94 06/05/2019   BUN 11 06/05/2019   CREATININE 0.73 06/05/2019   BILITOT 0.3 06/05/2019   ALKPHOS 104 11/14/2018   AST 14 06/05/2019   ALT 12 06/05/2019   PROT 7.1 06/05/2019   ALBUMIN 4.3 11/14/2018   CALCIUM 9.5 06/05/2019   GFRAA 134 06/05/2019   QFTBGOLDPLUS NEGATIVE 04/10/2019    Speciality Comments: No specialty comments available.  Procedures:  No procedures performed Allergies: Patient has no known allergies.   Assessment / Plan:     Visit Diagnoses: Ankylosing spondylitis of sacral region [facility]) -  History of chronic SI joint pain, SI joint sclerosis on the x-rays, HLA-B27 positive: She presents today with bilateral SI joint tenderness.  She has been experiencing increased discomfort and stiffness in her lower back recently.  She has been experiencing an iritis flare in the right eye for the past 2 weeks.  She was evaluated by her ophthalmologist on 10/03/2019 and was started on Durezol eyedrops which have been improving her symptoms.  She was started on Humira 40 mg subcu injections every 14 days on 05/09/2019.  She has had 2 iritis flares since starting on Humira.  We discussed adding on methotrexate 0.8 mL sq injections once weekly and continuing on Humira as prescribed.  She was in agreement.  Indications, contraindications, potential side effects of methotrexate were discussed today.  Consent was obtained.  She declined a prednisone taper at this time.  If  she continues to have recurrent flares we will increase the frequency of Humira to once weekly.  She will follow-up in the office in 6 weeks.  Iritis - Recurrent iritis since 2016: She was started on Humira 40 mg subcutaneous injections every 14 days on 05/09/19.  She has had 2 iritis flares since starting on Humira.  She had a flare in the left eye in January 2021 and new onset flare in the right eye starting about 2 weeks ago.  She was evaluated by her ophthalmologist on 10/03/2019 and was started on Durezol eyedrops.  She presents today with conjunctival injection in the right eye and continues to have eye pain and photophobia which have been improving since using Durezol drops.  We discussed adding on methotrexate 0.8 mL subcutaneous injections once weekly and folic acid 2 mg by mouth daily.  Indications, contraindications, potential side effects of methotrexate were discussed today.  All questions were addressed and consent was obtained.  She declined a prednisone taper at this time.  She will continue on Humira 40 mg of days injections every 14 days.  If she continues to have recurrent flares we may have to increase the frequency of Humira in the future.  Drug Counseling TB Gold: Negative 04/10/2019 Hepatitis panel: Negative 04/17/19   Chest-xray: Normal chest x-ray on 04/18/2019.  Contraception: She is taking an oral contraceptive.   Alcohol use: Discussed the importance of avoiding alcohol use.   Patient was counseled on the purpose, proper use, and adverse effects of methotrexate including nausea, infection, and signs and symptoms of pneumonitis.  Reviewed instructions with patient to take methotrexate weekly along with folic acid daily.  Discussed the importance of frequent monitoring of kidney and liver function and blood counts, and provided patient with standing lab instructions.  Counseled patient to avoid NSAIDs and alcohol while on methotrexate.  Provided patient with educational  materials on methotrexate and answered all questions.  Advised patient to get annual influenza vaccine and to get a pneumococcal vaccine if patient has not already had one.  Patient voiced understanding.  Patient consented to methotrexate use.  Will upload into chart.    HLA B27 positive  High risk medication use - Humira 40 mg sq injections every 14 days (started on 05/09/19).  We will be adding on methotrexate 0.8 mL subcu injections once weekly and folic acid 2 mg by mouth daily.  She is due to update CBC and CMP today.  She  will return for lab work in 2 weeks then 1 month and every 3 months.  Standing orders are in place.  She has had all necessary baseline immunosuppressive lab work.  She is also taking an oral contraceptive.   - Plan: COMPLETE METABOLIC PANEL WITH GFR, CBC with Differential/Platelet  Chronic SI joint pain: She has tenderness over bilateral SI joints on exam today.    ANA positive: ANA 1:80 Mitotic, intercellular bridge, 1:80 NH: She has no other clinical features of autoimmune disease.   Oral contraceptive use: Discussed the importance of compliance with oral contraceptive use while taking MTX.   Other medical conditions are listed as follows:   Vaginal high risk HPV DNA test positive  Pilonidal cyst  Orders: Orders Placed This Encounter  Procedures  . COMPLETE METABOLIC PANEL WITH GFR  . CBC with Differential/Platelet   No orders of the defined types were placed in this encounter.   Face-to-face time spent with patient was 30 minutes. Greater than 50% of time was spent in counseling and coordination of care.  Follow-Up Instructions: Return in about 6 weeks (around 11/20/2019) for Ankylosing Spondylitis, Iritis .   Hazel Sams, PA-C  I examined and evaluated the patient with Hazel Sams PA.  Patient is having recurrent episodes of iritis.  She has been also under stress due to recent surgery.  She had no synovitis on my examination.  She had some tenderness over  SI joints as well.  After discussing different treatment options and their side effects we decided to add subcu methotrexate for better control of her iritis.  We will see response to the combination therapy.  Of the plan of care was discussed as noted above.  Bo Merino, MD  Note - This record has been created using Editor, commissioning.  Chart creation errors have been sought, but may not always  have been located. Such creation errors do not reflect on  the standard of medical care.

## 2019-10-09 ENCOUNTER — Other Ambulatory Visit: Payer: Self-pay

## 2019-10-09 ENCOUNTER — Encounter: Payer: Self-pay | Admitting: Rheumatology

## 2019-10-09 ENCOUNTER — Ambulatory Visit: Payer: 59 | Admitting: Rheumatology

## 2019-10-09 VITALS — BP 108/75 | HR 83 | Resp 16 | Ht 68.0 in | Wt 217.0 lb

## 2019-10-09 DIAGNOSIS — M533 Sacrococcygeal disorders, not elsewhere classified: Secondary | ICD-10-CM

## 2019-10-09 DIAGNOSIS — H209 Unspecified iridocyclitis: Secondary | ICD-10-CM

## 2019-10-09 DIAGNOSIS — Z1589 Genetic susceptibility to other disease: Secondary | ICD-10-CM | POA: Diagnosis not present

## 2019-10-09 DIAGNOSIS — L0591 Pilonidal cyst without abscess: Secondary | ICD-10-CM

## 2019-10-09 DIAGNOSIS — M458 Ankylosing spondylitis sacral and sacrococcygeal region: Secondary | ICD-10-CM

## 2019-10-09 DIAGNOSIS — R87811 Vaginal high risk human papillomavirus (HPV) DNA test positive: Secondary | ICD-10-CM

## 2019-10-09 DIAGNOSIS — Z3041 Encounter for surveillance of contraceptive pills: Secondary | ICD-10-CM

## 2019-10-09 DIAGNOSIS — G8929 Other chronic pain: Secondary | ICD-10-CM

## 2019-10-09 DIAGNOSIS — R768 Other specified abnormal immunological findings in serum: Secondary | ICD-10-CM

## 2019-10-09 DIAGNOSIS — Z79899 Other long term (current) drug therapy: Secondary | ICD-10-CM

## 2019-10-09 NOTE — Progress Notes (Signed)
Pharmacy Note  Subjective: Patient presents today to [facility] for follow up office visit. Patient seen by the pharmacist for counseling on methotrexate for iritis. Prior therapy includes:Humira.  Objective: CBC    Component Value Date/Time   WBC 6.0 06/05/2019 1445   RBC 5.03 06/05/2019 1445   HGB 13.9 06/05/2019 1445   HCT 42.5 06/05/2019 1445   PLT 413 (H) 06/05/2019 1445   MCV 84.5 06/05/2019 1445   MCH 27.6 06/05/2019 1445   MCHC 32.7 06/05/2019 1445   RDW 13.9 06/05/2019 1445   LYMPHSABS 1,632 06/05/2019 1445   MONOABS 0.4 09/13/2017 1002   EOSABS 162 06/05/2019 1445   BASOSABS 42 06/05/2019 1445    CMP     Component Value Date/Time   NA 138 06/05/2019 1445   K 4.0 06/05/2019 1445   CL 103 06/05/2019 1445   CO2 25 06/05/2019 1445   GLUCOSE 94 06/05/2019 1445   BUN 11 06/05/2019 1445   CREATININE 0.73 06/05/2019 1445   CALCIUM 9.5 06/05/2019 1445   PROT 7.1 06/05/2019 1445   ALBUMIN 4.3 11/14/2018 1052   AST 14 06/05/2019 1445   ALT 12 06/05/2019 1445   ALKPHOS 104 11/14/2018 1052   BILITOT 0.3 06/05/2019 1445   GFRNONAA 115 06/05/2019 1445   GFRAA 134 06/05/2019 1445    Baseline Immunosuppressant Therapy Labs TB GOLD Quantiferon TB Gold Latest Ref Rng & Units 04/10/2019  Quantiferon TB Gold Plus NEGATIVE NEGATIVE   Hepatitis Panel Hepatitis Latest Ref Rng & Units 04/17/2019  Hep B Surface Ag NON-REACTI NON-REACTIVE  Hep B IgM NON-REACTI NON-REACTIVE  Hep C Ab NON-REACTI NON-REACTIVE  Hep C Ab NON-REACTI NON-REACTIVE  Hep A IgM NON-REACTI NON-REACTIVE   HIV Lab Results  Component Value Date   HIV NONREACTIVE 07/27/2016   Immunoglobulins Immunoglobulin Electrophoresis Latest Ref Rng & Units 04/17/2019  IgA  47 - 310 mg/dL 247  IgG 600 - 1,640 mg/dL 1,256  IgM 50 - 300 mg/dL 94   SPEP Serum Protein Electrophoresis Latest Ref Rng & Units 06/05/2019  Total Protein 6.1 - 8.1 g/dL 7.1  Albumin 3.8 - 4.8 g/dL -  Alpha-1 0.2 - 0.3  g/dL -  Alpha-2 0.5 - 0.9 g/dL -  Beta Globulin 0.4 - 0.6 g/dL -  Beta 2 0.2 - 0.5 g/dL -  Gamma Globulin 0.8 - 1.7 g/dL -   G6PD No results found for: G6PDH TPMT No results found for: TPMT   Chest-xray:  Normal 04/18/2019  Contraception: on birth control  Alcohol use: on occasion.  Advised of [employer] of Rheumatology recommendation to limit to 1 alcoholic drink per week.  Assessment/Plan:   Patient was counseled on the purpose, proper use, and adverse effects of methotrexate including nausea, infection, and signs and symptoms of pneumonitis. Discussed that there is the possibility of an increased risk of malignancy, specifically lymphomas, but it is not well understood if this increased risk is due to the medication or the disease state.  Instructed patient that medication should be held for infection and prior to surgery.  Advised patient to avoid live vaccines. Recommend annual influenza, Pneumovax 23, Prevnar 13, and Shingrix as indicated.   Reviewed instructions with patient to take methotrexate weekly along with folic acid daily.  Discussed the importance of frequent monitoring of kidney and liver function and blood counts, and provided patient with standing lab instructions.  Counseled patient to avoid NSAIDs and alcohol while on methotrexate.  Provided patient with educational materials on methotrexate and answered  all questions.   Patient voiced understanding.  Patient consented to methotrexate use.  Will upload into chart.    Dose of methotrexate will be 20 mg every 7 days along with folic acid 2 mg daily. Prescription pending lab results.  Educated patient on how to use a vial and syringe and reviewed injection technique with patient.  Patient was able to demonstrate proper technique for injections using vial and syringe.  Provided patient educational material regarding injection technique and storage of methotrexate.  All question encouraged and answered.  Instructed  patient to call with any further questions or concerns.   Mariella Saa, PharmD, [place], [place] Clinical Specialty Pharmacist [phone]  10/09/2019 10:03 AM

## 2019-10-09 NOTE — Patient Instructions (Signed)
Standing Labs We placed an order today for your standing lab work.    Please come back and get your standing labs in 2 weeks, 1 month, then every 3 months   We have open lab daily Monday through Thursday from 8:30-12:30 PM and 1:30-4:30 PM and Friday from 8:30-12:30 PM and 1:30-4:00 PM at the office of Dr. Bo Merino.   You may experience shorter wait times on Monday and Friday afternoons. The office is located at [address], [place], [place], [place] [address] No appointment is necessary.   Labs are drawn by [facility].  You may receive a bill from [place] for your lab work.  If you wish to have your labs drawn at another location, please call the office 24 hours in advance to send orders.  If you have any questions regarding directions or hours of operation,  please call [phone].   Just as a reminder please drink plenty of water prior to coming for your lab work. Thanks!    Methotrexate subcutaneous injection What is this medicine? METHOTREXATE (METH oh TREX ate) is a cytotoxic drug that also suppresses the immune system. It is used to treat psoriasis and rheumatoid arthritis. This medicine may be used for other purposes; ask your health care provider or pharmacist if you have questions. COMMON BRAND NAME(S): Otrexup, Rasuvo What should I tell my health care provider before I take this medicine? They need to know if you have any of these conditions:  fluid in the stomach area or lungs  if you often drink alcohol  infection or immune system problems  kidney disease  liver disease  low blood counts, like low white cell, platelet, or red cell counts  lung disease  radiation therapy  stomach ulcers  ulcerative colitis  an unusual or allergic reaction to methotrexate, other medicines, foods, dyes, or preservatives  pregnant or trying to get pregnant  breast-feeding How should I use this medicine? This medicine is for injection under the skin. You will  be taught how to prepare and give this medicine. Refer to the Instructions for Use that come with your medication packaging. Use exactly as directed. Take your medicine at regular intervals. Do not take your medicine more often than directed. This medicine should be taken weekly, NOT daily. It is important that you put your used needles and syringes in a special sharps container. Do not put them in a trash can. If you do not have a sharps container, call your pharmacist of healthcare provider to get one. Talk to your pediatrician regarding the use of this medicine in children. While this drug may be prescribed for children as young as 2 years for selected conditions, precautions do apply. Overdosage: If you think you have taken too much of this medicine contact a poison control center or emergency room at once. NOTE: This medicine is only for you. Do not share this medicine with others. What if I miss a dose? If you are not sure if this medicine was injected or if you have a hard time giving the injection, do not inject another dose. Talk with your doctor or health care professional. What may interact with this medicine? This medicine may interact with the following medications:  acitretin  aspirin or aspirin-like medicines including salicylates  azathioprine  certain antibiotics like chloramphenicol, penicillin, tetracycline  cyclosporine  gold  hydroxychloroquine  live virus vaccines  mercaptopurine  NSAIDs, medicines for pain and inflammation, like ibuprofen or naproxen  other cytotoxic agents  penicillamine  phenylbutazone  phenytoin  probenacid  retinoids such as isotretinoin and tretinoin  steroid medicines like prednisone or cortisone  sulfonamides like sulfasalazine and trimethoprim/sulfamethoxazole  theophylline This list may not describe all possible interactions. Give your health care provider a list of all the medicines, herbs, non-prescription drugs, or  dietary supplements you use. Also tell them if you smoke, drink alcohol, or use illegal drugs. Some items may interact with your medicine. What should I watch for while using this medicine? Avoid alcoholic drinks. This medicine can make you more sensitive to the sun. Keep out of the sun. If you cannot avoid being in the sun, wear protective clothing and use sunscreen. Do not use sun lamps or tanning beds/booths. You may get drowsy or dizzy. Do not drive, use machinery, or do anything that needs mental alertness until you know how this medicine affects you. Do not stand or sit up quickly, especially if you are an older patient. This reduces the risk of dizzy or fainting spells. You may need blood work done while you are taking this medicine. Call your doctor or health care professional for advice if you get a fever, chills or sore throat, or other symptoms of a cold or flu. Do not treat yourself. This drug decreases your body's ability to fight infections. Try to avoid being around people who are sick. This medicine may increase your risk to bruise or bleed. Call your doctor or health care professional if you notice any unusual bleeding. Check with your doctor or health care professional if you get an attack of severe diarrhea, nausea and vomiting, or if you sweat a lot. The loss of too much body fluid can make it dangerous for you to take this medicine. Talk to your doctor about your risk of cancer. You may be more at risk for certain types of cancers if you take this medicine. Both men and women must use effective birth control with this medicine. Do not become pregnant while taking this medicine or until at least 1 normal menstrual cycle has occurred after stopping it. Women should inform their doctor if they wish to become pregnant or think they might be pregnant. Men should not father a child while taking this medicine and for 3 months after stopping it. There is a potential for serious side effects to  an unborn child. Talk to your health care professional or pharmacist for more information. Do not breast-feed an infant while taking this medicine. What side effects may I notice from receiving this medicine? Side effects that you should report to your doctor or health care professional as soon as possible:  allergic reactions like skin rash, itching or hives, swelling of the face, lips, or tongue  breathing problems or shortness of breath  diarrhea  dry, nonproductive cough  low blood counts - this medicine may decrease the number of white blood cells, red blood cells and platelets. You may be at increased risk of infections and bleeding  mouth sores  redness, blistering, peeling or loosening of the skin, including inside the mouth  signs of infection - fever or chills, cough, sore throat, pain or difficulty passing urine  signs and symptoms of bleeding such as bloody or black, tarry stools; red or dark-brown urine; spitting up blood or brown material that looks like coffee grounds; red spots on the skin; unusual bruising or bleeding from the eye, gums, or nose  signs and symptoms of kidney injury like trouble passing urine or change in the amount of  urine  signs and symptoms of liver injury like dark yellow or brown urine; general ill feeling or flu-like symptoms; light-colored stools; loss of appetite; nausea; right upper belly pain; unusually weak or tired; yellowing of the eyes or skin Side effects that usually do not require medical attention (report to your doctor or health care professional if they continue or are bothersome):  dizziness  hair loss  headache  stomach pain  upset stomach  vomiting This list may not describe all possible side effects. Call your doctor for medical advice about side effects. You may report side effects to FDA at 1-800-FDA-1088. Where should I keep my medicine? Keep out of the reach of children. You will be instructed on how to store this  medicine. Throw away any unused medicine after the expiration date on the label. NOTE: This sheet is a summary. It may not cover all possible information. If you have questions about this medicine, talk to your doctor, pharmacist, or health care provider.  2020 Elsevier/Gold Standard (2017-01-26 13:37:10)

## 2019-10-10 ENCOUNTER — Other Ambulatory Visit: Payer: Self-pay | Admitting: Rheumatology

## 2019-10-10 DIAGNOSIS — M458 Ankylosing spondylitis sacral and sacrococcygeal region: Secondary | ICD-10-CM

## 2019-10-10 LAB — CBC WITH DIFFERENTIAL/PLATELET
Absolute Monocytes: 538 cells/uL (ref 200–950)
Basophils Absolute: 22 cells/uL (ref 0–200)
Basophils Relative: 0.4 %
Eosinophils Absolute: 101 cells/uL (ref 15–500)
Eosinophils Relative: 1.8 %
HCT: 45.1 % — ABNORMAL HIGH (ref 35.0–45.0)
Hemoglobin: 14.8 g/dL (ref 11.7–15.5)
Lymphs Abs: 1092 cells/uL (ref 850–3900)
MCH: 28.2 pg (ref 27.0–33.0)
MCHC: 32.8 g/dL (ref 32.0–36.0)
MCV: 86.1 fL (ref 80.0–100.0)
MPV: 10.2 fL (ref 7.5–12.5)
Monocytes Relative: 9.6 %
Neutro Abs: 3847 cells/uL (ref 1500–7800)
Neutrophils Relative %: 68.7 %
Platelets: 387 10*3/uL (ref 140–400)
RBC: 5.24 10*6/uL — ABNORMAL HIGH (ref 3.80–5.10)
RDW: 12.8 % (ref 11.0–15.0)
Total Lymphocyte: 19.5 %
WBC: 5.6 10*3/uL (ref 3.8–10.8)

## 2019-10-10 LAB — COMPLETE METABOLIC PANEL WITH GFR
AG Ratio: 1.4 (calc) (ref 1.0–2.5)
ALT: 74 U/L — ABNORMAL HIGH (ref 6–29)
AST: 44 U/L — ABNORMAL HIGH (ref 10–30)
Albumin: 4.2 g/dL (ref 3.6–5.1)
Alkaline phosphatase (APISO): 114 U/L (ref 31–125)
BUN: 12 mg/dL (ref 7–25)
CO2: 26 mmol/L (ref 20–32)
Calcium: 9.9 mg/dL (ref 8.6–10.2)
Chloride: 105 mmol/L (ref 98–110)
Creat: 0.78 mg/dL (ref 0.50–1.10)
GFR, Est African American: 123 mL/min/{1.73_m2} (ref >=60)
GFR, Est Non African American: 106 mL/min/{1.73_m2} (ref >=60)
Globulin: 3 g/dL (calc) (ref 1.9–3.7)
Glucose, Bld: 77 mg/dL (ref 65–99)
Potassium: 4.3 mmol/L (ref 3.5–5.3)
Sodium: 140 mmol/L (ref 135–146)
Total Bilirubin: 0.4 mg/dL (ref 0.2–1.2)
Total Protein: 7.2 g/dL (ref 6.1–8.1)

## 2019-10-10 NOTE — Progress Notes (Signed)
AST and ALT are elevated.  Please call the patient to clarify if she is taking tylenol, NSAIDs, or drinking alcohol. Any other medication changes recently?   She will be unable to start on MTX with elevated LFTs.     Rest of CMP WNL. CBC stable.

## 2019-10-10 NOTE — Telephone Encounter (Signed)
Last Visit: 10/09/2019 Next Visit: 11/20/2019 Labs: 10/09/2019 RBC 5.24, HCT 45.1, AST 44, ALT 74. TB Gold: 04/10/2019 negative   Okay to refill humira?

## 2019-10-10 NOTE — Telephone Encounter (Signed)
Ok to refill Humira

## 2019-10-11 ENCOUNTER — Other Ambulatory Visit: Payer: Self-pay

## 2019-10-11 DIAGNOSIS — R7989 Other specified abnormal findings of blood chemistry: Secondary | ICD-10-CM

## 2019-10-11 NOTE — Progress Notes (Signed)
She should avoid all NSAIDs and alcohol use.  We can repeat LFTs in 3 weeks.  In the meantime we can increase Humira to every week for 2 months.  Please give her 4 Humira pen samples to accommodate.

## 2019-10-14 ENCOUNTER — Telehealth: Payer: Self-pay

## 2019-10-14 NOTE — Telephone Encounter (Signed)
Medication Samples have been provided to the patient.  Drug name: Humira   Strength: 40mg        Qty: 4 [id number]  Exp.Date: 11/2020   Dosing instructions: Inject 1 pen into the skin once weekly for 2 months.   The patient has been instructed regarding the correct time, dose, and frequency of taking this medication, including desired effects and most common side effects.   Toni Livingston 9:33 AM 10/14/2019

## 2019-10-25 ENCOUNTER — Other Ambulatory Visit: Payer: Self-pay

## 2019-10-25 DIAGNOSIS — Z79899 Other long term (current) drug therapy: Secondary | ICD-10-CM

## 2019-10-25 DIAGNOSIS — R7989 Other specified abnormal findings of blood chemistry: Secondary | ICD-10-CM

## 2019-10-26 LAB — ALT: ALT: 61 U/L — ABNORMAL HIGH (ref 6–29)

## 2019-10-26 LAB — AST: AST: 34 U/L — ABNORMAL HIGH (ref 10–30)

## 2019-10-28 NOTE — Progress Notes (Signed)
I called patient regarding the lab results.  She denies taking any NSAIDs or drinking alcohol.  I have advised her to continue to avoid NSAIDs or Tylenol.  We would not be able to start her on methotrexate.  She will continue Humira.  Most likely the elevated LFTs are due to Humira.  We will recheck LFTs at the follow-up visit.

## 2019-11-08 NOTE — Progress Notes (Signed)
Office Visit Note  Patient: Toni Livingston             Date of Birth: Oct 08, 1995           MRN: [id number]             PCP: Ma Hillock, DO Referring: Ma Hillock, DO Visit Date: 11/20/2019 Occupation: @GUAROCC @  Subjective:  Medication monitoring   History of Present Illness: Toni Livingston is a 24 y.o. female with history of ankylosing spondylitis and iritis.  She has been injecting Humira 40 mg sq injections weekly for the past 2 months and was using durezol drops 4 times daily for management of the a iritis flare in the left eye.   Her symptoms have improved since increasing the frequency of Humira injections.  She was unable to add MTX as recommended due to elevated LFTs.  She states she was last evaluated by Dr. Posey Pronto 3 weeks ago and at that time her right eye flare had resolved. She states about 1 week later her left eye started causing pain, so she started using durezol drops as recommended and her symptoms resolved within 3 days.   She states she has occasional right SI joint pain, which is exacerbated by strenuous activity.  She denies any other joint pain or joint swelling at this time.  She denies any morning stiffness.  She denies any Achilles tendinitis or plantar fasciitis.  She has no psoriasis at this time.  Activities of Daily Living:  Patient reports morning stiffness for 0 minutes.   Patient Denies nocturnal pain.  Difficulty dressing/grooming: Denies Difficulty climbing stairs: Denies Difficulty getting out of chair: Reports Difficulty using hands for taps, buttons, cutlery, and/or writing: Denies  Review of Systems  Constitutional: Negative for fatigue.  HENT: Negative for mouth sores, mouth dryness and nose dryness.   Eyes: Negative for photophobia, pain, redness, itching and dryness.  Respiratory: Negative for shortness of breath and difficulty breathing.   Cardiovascular: Negative for chest pain and palpitations.  Gastrointestinal: Negative for blood in  stool, constipation and diarrhea.  Endocrine: Negative for increased urination.  Genitourinary: Negative for difficulty urinating and painful urination.  Musculoskeletal: Negative for arthralgias, joint pain, joint swelling, myalgias, morning stiffness, muscle tenderness and myalgias.  Skin: Negative for color change, rash, hair loss and redness.  Allergic/Immunologic: Negative for susceptible to infections.  Neurological: Positive for headaches. Negative for dizziness, numbness, memory loss and weakness.  Hematological: Negative for bruising/bleeding tendency.  Psychiatric/Behavioral: Negative for confusion and sleep disturbance.    PMFS History:  Patient Active Problem List   Diagnosis Date Noted  . Closed fracture of fifth metacarpal bone 07/19/2019  . HLA B27 (HLA B27 positive) 04/16/2019  . ANA positive 04/16/2019  . Iritis 04/10/2019  . DDD (degenerative disc disease), lumbar 04/10/2019  . Vaginal high risk HPV DNA test positive 11/19/2018  . Obesity (BMI 30-39.9) 11/14/2018  . Pilonidal cyst 11/14/2018  . LGSIL of cervix of undetermined significance 11/14/2018  . Irregular menses 11/14/2018  . Encounter for well woman exam with routine gynecological exam 09/13/2017  . Oral contraceptive use 07/27/2016  . Encounter for long-term current use of medication 07/27/2016    Past Medical History:  Diagnosis Date  . Ankylosing spondylitis ([place])   . Iritis   . Vertigo     Family History  Problem Relation Age of Onset  . Hypertension Mother   . Testicular cancer Father   . Healthy Sister   . Healthy Brother   .  Uterine cancer Maternal Aunt   . Healthy Brother   . Depression Sister   . Anxiety disorder Sister    Past Surgical History:  Procedure Laterality Date  . BREAST REDUCTION SURGERY  2015  . HAND SURGERY Left 07/24/2019   Social History   Social History Narrative   Single. Some college.    Works as Therapist, nutritional.    Drinks caffeine.    Wears seatbelt.  Smoke detector in the home.    Exercises routinely.    Feels safe in relationships.       Immunization History  Administered Date(s) Administered  . Influenza,inj,Quad PF,6+ Mos 03/09/2017, 03/05/2018, 02/27/2019  . Influenza-Unspecified 03/20/2016  . PPD Test 07/27/2016  . Tdap 07/27/2016     Objective: Vital Signs: BP 113/72 (BP Location: Left Arm, Patient Position: Sitting, Cuff Size: Normal)   Pulse 75   Resp 12   Ht 5' 8"  (1.727 m)   Wt 216 lb 9.6 oz (98.2 kg)   LMP 11/02/2019 (Exact Date)   BMI 32.93 kg/m    Physical Exam Vitals and nursing note reviewed.  Constitutional:      Appearance: She is well-developed.  HENT:     Head: Normocephalic and atraumatic.  Eyes:     Conjunctiva/sclera: Conjunctivae normal.  Pulmonary:     Effort: Pulmonary effort is normal.  Abdominal:     General: Bowel sounds are normal.     Palpations: Abdomen is soft.  Musculoskeletal:     Cervical back: Normal range of motion.  Lymphadenopathy:     Cervical: No cervical adenopathy.  Skin:    General: Skin is warm and dry.     Capillary Refill: Capillary refill takes less than 2 seconds.  Neurological:     Mental Status: She is alert and oriented to person, place, and time.  Psychiatric:        Behavior: Behavior normal.      Musculoskeletal Exam: C-spine, thoracic spine, lumbar spine good range of motion.  No midline spinal tenderness.  She has tenderness over the right SI joint.  Shoulder joints, elbow joints, wrist joints, MCPs, PIPs and DIPs good range of motion with no synovitis.  She has complete fist formation bilaterally.  Hip joints, knee joints, ankle joints, MTPs, PIPs and DIPs good range of motion with no synovitis.  No warmth or effusion of bilateral knee joints.  No tenderness or swelling of ankle joints.  No Achilles tendinitis or plantar fasciitis.  No tenderness of MTP joints.  CDAI Exam: CDAI Score: -- Patient Global: --; Provider Global: -- Swollen: 0 ; Tender: 1   Joint Exam 11/20/2019      Right  Left  Sacroiliac   Tender        Investigation: No additional findings.  Imaging: No results found.  Recent Labs: Lab Results  Component Value Date   WBC 5.6 10/09/2019   HGB 14.8 10/09/2019   PLT 387 10/09/2019   NA 140 10/09/2019   K 4.3 10/09/2019   CL 105 10/09/2019   CO2 26 10/09/2019   GLUCOSE 77 10/09/2019   BUN 12 10/09/2019   CREATININE 0.78 10/09/2019   BILITOT 0.4 10/09/2019   ALKPHOS 104 11/14/2018   AST 34 (H) 10/25/2019   ALT 61 (H) 10/25/2019   PROT 7.2 10/09/2019   ALBUMIN 4.3 11/14/2018   CALCIUM 9.9 10/09/2019   GFRAA 123 10/09/2019   QFTBGOLDPLUS NEGATIVE 04/10/2019    Speciality Comments: No specialty comments available.  Procedures:  No procedures performed  Allergies: Patient has no known allergies.   Assessment / Plan:     Visit Diagnoses: Ankylosing spondylitis of sacral region [facility]) - History of chronic SI joint pain, SI joint sclerosis on the x-rays, HLA-B27 positive: She has tenderness to palpation over the right SI joint on exam today.  She has been experiencing intermittent right SI joint pain which is exacerbated by physical activity.  She has not had any morning stiffness or nocturnal pain.  She has no synovitis or dactylitis on exam.  She has not had any Achilles tendinitis or plantar fasciitis recently.  She is currently on Humira 40 mg subcutaneous injections once weekly.  She was started on Humira on 05/09/2019 but continued to have recurrent iritis flares.  For the past 6 weeks she has been injecting Humira on a weekly basis.  She has 2 more weekly injections of Humira and then will resume spacing Humira to every 14 days.  We were unable to add methotrexate due to elevated LFTs.  She does not take Tylenol, NSAIDs, or drink alcohol.  We will refer her to GI for further evaluation of elevated LFTs.  She will continue on Humira as discussed above.  She will follow-up in the office in 3 months.  High risk  medication use -  Humira 40 mg sq injections every week for 8 weeks and then will resume injections every 14 days.  (started on 05/09/19).  CBC and CMP were drawn on 10/09/2019.  LFTs were elevated at that time.  LFTs were rechecked on 10/25/2019 and remained elevated but were trending down.  She does not take Tylenol, NSAIDs, or alcohol.  She will be referred to GI for further evaluation.  TB gold negative on 04/10/2019.  She has not had any recent infections.  Elevated LFTs -AST was 34 and ALT was 61 on 10/25/2019.  She does not take Tylenol, NSAIDs, or drink alcohol.  She will be referred to GI for further evaluation.  HLA B27 positive  Iritis - Recurrent iritis since 2016: She has been experiencing recurrent iritis flares since starting on Humira on 05/09/2019.  She has been using Durezol drops as needed during flares.  After her last office visit on 10/09/2019 she increased the frequency of Humira injections to every week instead of every 2 weeks for 8 weeks.  The plan was to start her on methotrexate as combination therapy but her LFTs were elevated at that time.  She will be unable to take methotrexate at this time.  She has 2 more weeks of weekly Humira injections and then will start spacing to every 14 days.  She was advised to notify us if she continues to have recurrent flares.  She does not want to be referred to Dr. Manuella Ghazi at this time.  Chronic SI joint pain: She has tenderness to palpation over the right SI joint.  She was encouraged to perform stretching exercises daily.  We discussed that if her discomfort persists or worsens she can return for a cortisone injection.  ANA positive: She has no other clinical features of autoimmune disease at this time.  Other medical conditions are listed as follows:  Oral contraceptive use  Vaginal high risk HPV DNA test positive  Pilonidal cyst  Orders: Orders Placed This Encounter  Procedures  . Ambulatory referral to Gastroenterology   No orders  of the defined types were placed in this encounter.     Follow-Up Instructions: Return in about 3 months (around 02/20/2020) for Ankylosing Spondylitis, iritis.  Hazel Sams, PA-C  I examined and evaluated the patient with Hazel Sams PA. Patient continues to have some SI joint discomfort and iritis symptoms. Be unable to give her methotrexate due to elevated LFTs. The cause of elevated LFTs is unclear. It may be related to Humira. Will refer to GI for evaluation. The plan of care was discussed as noted above.  Bo Merino, MD  Note - This record has been created using Editor, commissioning.  Chart creation errors have been sought, but may not always  have been located. Such creation errors do not reflect on  the standard of medical care.

## 2019-11-11 ENCOUNTER — Ambulatory Visit (INDEPENDENT_AMBULATORY_CARE_PROVIDER_SITE_OTHER): Payer: 59 | Admitting: Family Medicine

## 2019-11-11 ENCOUNTER — Other Ambulatory Visit: Payer: Self-pay

## 2019-11-11 ENCOUNTER — Encounter: Payer: Self-pay | Admitting: Family Medicine

## 2019-11-11 VITALS — BP 112/75 | HR 77 | Temp 98.1°F | Resp 17 | Ht 69.0 in | Wt 216.0 lb

## 2019-11-11 DIAGNOSIS — E669 Obesity, unspecified: Secondary | ICD-10-CM

## 2019-11-11 DIAGNOSIS — Z0000 Encounter for general adult medical examination without abnormal findings: Secondary | ICD-10-CM | POA: Diagnosis not present

## 2019-11-11 DIAGNOSIS — Z131 Encounter for screening for diabetes mellitus: Secondary | ICD-10-CM

## 2019-11-11 DIAGNOSIS — M2550 Pain in unspecified joint: Secondary | ICD-10-CM | POA: Diagnosis not present

## 2019-11-11 LAB — LIPID PANEL
Cholesterol: 176 mg/dL (ref 0–200)
HDL: 63.6 mg/dL (ref >39.00)
LDL Cholesterol: 89 mg/dL (ref 0–99)
NonHDL: 112.46
Total CHOL/HDL Ratio: 3
Triglycerides: 117 mg/dL (ref 0.0–149.0)
VLDL: 23.4 mg/dL (ref 0.0–40.0)

## 2019-11-11 LAB — HEMOGLOBIN A1C: Hgb A1c MFr Bld: 4.9 % (ref 4.6–6.5)

## 2019-11-11 LAB — TSH: TSH: 1.76 u[IU]/mL (ref 0.35–4.50)

## 2019-11-11 NOTE — Progress Notes (Signed)
This visit occurred during the SARS-CoV-2 public health emergency.  Safety protocols were in place, including screening questions prior to the visit, additional usage of staff PPE, and extensive cleaning of exam room while observing appropriate contact time as indicated for disinfecting solutions.    Patient ID: Toni Livingston, female  DOB: 26-Jun-1995, 24 y.o.   MRN: [id number] Patient Care Team    Relationship Specialty Notifications Start End  Ma Hillock, DO PCP - General Family Medicine  07/27/16   Starling Manns, MD  Orthopedic Surgery  04/16/19   Virgina Evener, [place] Referring Physician Optometry  04/16/19   Princess Bruins, MD Consulting Physician Obstetrics and Gynecology  11/11/19   Bo Merino, MD Consulting Physician Rheumatology  11/11/19     Chief Complaint  Patient presents with  . Annual Exam    Not fasting. Needs forms filled out for school. Last pap 11/14/2018.     Subjective:  Toni Livingston is a 24 y.o.  Female  present for CPE. All past medical history, surgical history, allergies, family history, immunizations, medications and social history were updated in the electronic medical record today. All recent labs, ED visits and hospitalizations within the last year were reviewed.  Well woman exam:menstrual cycles are now rather routine once every other month.She is sexually active with one female partner.   Her Pap smear 08/2017 was abnormal..Patient's last menstrual period was Patient's last menstrual period was 11/02/2019 (exact date).  Health maintenance: Mammogram: routine screen at 39- no fhx.  Cervical cancer screening:10/2018- NL pap/Positive HR HPV. Pt has Gynecologist w/appt end of summer. Gyn manages her BCP- she is on a Loestrin FE formula.  Immunizations: tdapUTD 2018, InfluenzaUTD 2020(encouraged yearly).Infectious disease screening: HIVcompleted 2018 Assistive device: none Oxygen [id number] Patient has a Dental home. Hospitalizations/ED visits:  reviewed  Depression screen [facility] 2/9 11/14/2018 09/13/2017 07/27/2016  Decreased Interest 0 0 0  Down, Depressed, Hopeless 0 0 0  PHQ - 2 Score 0 0 0   No flowsheet data found.   Immunization History  Administered Date(s) Administered  . Influenza,inj,Quad PF,6+ Mos 03/09/2017, 03/05/2018, 02/27/2019  . Influenza-Unspecified 03/20/2016  . PPD Test 07/27/2016  . Tdap 07/27/2016    Past Medical History:  Diagnosis Date  . Ankylosing spondylitis ([place])   . Iritis   . Vertigo    No Known Allergies Past Surgical History:  Procedure Laterality Date  . BREAST REDUCTION SURGERY  2015  . HAND SURGERY Left 07/24/2019   Family History  Problem Relation Age of Onset  . Hypertension Mother   . Testicular cancer Father   . Healthy Sister   . Healthy Brother   . Uterine cancer Maternal Aunt   . Healthy Brother   . Depression Sister   . Anxiety disorder Sister    Social History   Social History Narrative   Single. Some college.    Works as Therapist, nutritional.    Drinks caffeine.    Wears seatbelt. Smoke detector in the home.    Exercises routinely.    Feels safe in relationships.        Allergies as of 11/11/2019   No Known Allergies     Medication List       Accurate as of Nov 11, 2019  9:27 AM. If you have any questions, ask your nurse or doctor.        B COMPLEX PO Take by mouth daily.   Durezol 0.05 % Emul Generic drug: Difluprednate PLACE 1 DROP IN LEFT EYE EVERY  2 HOURS TODAY AND TOMORROW, THEN 4 TIMES A DAY   Humira Pen 40 MG/0.4ML Pnkt Generic drug: Adalimumab INJECT 40MG  SUBCUTANEOUSLY  EVERY 14 DAYS   Lo Loestrin Fe 1 MG-10 MCG / 10 MCG tablet Generic drug: Norethindrone-Ethinyl Estradiol-Fe Biphas Take 1 tablet by mouth daily.   VITAMIN C PO Take by mouth daily.       All past medical history, surgical history, allergies, family history, immunizations andmedications were updated in the EMR today and reviewed under the history and medication  portions of their EMR.     ROS: 14 pt review of systems performed and negative (unless mentioned in an HPI)  Objective: BP 112/75 (BP Location: Left Arm, Patient Position: Sitting, Cuff Size: Normal)   Pulse 77   Temp 98.1 F (36.7 C) (Temporal)   Resp 17   Ht 5\' 9"  (1.753 m)   Wt 216 lb (98 kg)   LMP 11/02/2019 (Exact Date)   SpO2 98%   BMI 31.90 kg/m  Gen: Afebrile. No acute distress. Nontoxic in appearance, well-developed, well-nourished,  Pleasant caucasian female. Obese.  HENT: AT. [place]. Bilateral TM visualized and normal in appearance, normal external auditory canal. MMM, no oral lesions, adequate dentition. Bilateral nares within normal limits. Throat without erythema, ulcerations or exudates. no Cough on exam, no hoarseness on exam. Eyes:Pupils Equal Round Reactive to light, Extraocular movements intact,  Conjunctiva without redness, discharge or icterus. Neck/lymp/endocrine: Supple,no lymphadenopathy, no thyromegaly CV: RRR no murmur, no edema, +2/4 P posterior tibialis pulses.  Chest: CTAB, no wheeze, rhonchi or crackles. normal Respiratory effort. good Air movement. Abd: Soft. flat. NTND. BS present. no Masses palpated. No hepatosplenomegaly. No rebound tenderness or guarding. Skin: no rashes, purpura or petechiae. Warm and well-perfused. Skin intact. Neuro/Msk:  Normal gait. PERLA. EOMi. Alert. Oriented x3.  Cranial nerves II through XII intact. Muscle strength 5/5 upper/lower extremity. DTRs equal bilaterally. Psych: Normal affect, dress and demeanor. Normal speech. Normal thought content and judgment.  No exam data present  Assessment/plan: Toni Livingston is a 24 y.o. female present for CPE Obesity (BMI 30-39.9) Routine exercise encouraged.  - Lipid panel Diabetes mellitus screening - Hemoglobin A1c Arthralgia, unspecified joint - TSH Encounter for preventive health examination Patient was encouraged to exercise greater than 150 minutes a week. Patient was encouraged  to choose a diet filled with fresh fruits and vegetables, and lean meats. AVS provided to patient today for education/recommendation on gender specific health and safety maintenance. Mammogram: routine screen at 39- no fhx.  Cervical cancer screening:10/2018- NL pap/Positive HR HPV-Pt has Gynecologist w/appt end of summer. Gyn manages her BCP. Pt reports it has become expensive> encouraged her to call her gyn to discuss alternatives since they are managing bcp.  Immunizations: tdapUTD 2018, InfluenzaUTD 2020(encouraged yearly).   Return in about 1 year (around 11/10/2020) for CPE (30 min).   Orders Placed This Encounter  Procedures  . Hemoglobin A1c  . Lipid panel  . TSH   No orders of the defined types were placed in this encounter.  Referral Orders  No referral(s) requested today     Electronically signed by: Howard Pouch, [place]

## 2019-11-11 NOTE — Patient Instructions (Addendum)
COVID-19 Vaccine Information can be found at: [email or web address] For questions related to vaccine distribution or appointments, please email [email or web address] or call [phone].  Covid Vaccine appointment go to [email or web address].   Health Maintenance, Female Adopting a healthy lifestyle and getting preventive care are important in promoting health and wellness. Ask your health care provider about:  The right schedule for you to have regular tests and exams.  Things you can do on your own to prevent diseases and keep yourself healthy. What should I know about diet, weight, and exercise? Eat a healthy diet   Eat a diet that includes plenty of vegetables, fruits, low-fat dairy products, and lean protein.  Do not eat a lot of foods that are high in solid fats, added sugars, or sodium. Maintain a healthy weight Body mass index (BMI) is used to identify weight problems. It estimates body fat based on height and weight. Your health care provider can help determine your BMI and help you achieve or maintain a healthy weight. Get regular exercise Get regular exercise. This is one of the most important things you can do for your health. Most adults should:  Exercise for at least 150 minutes each week. The exercise should increase your heart rate and make you sweat (moderate-intensity exercise).  Do strengthening exercises at least twice a week. This is in addition to the moderate-intensity exercise.  Spend less time sitting. Even light physical activity can be beneficial. Watch cholesterol and blood lipids Have your blood tested for lipids and cholesterol at 25 years of age, then have this test every 5 years. Have your cholesterol levels checked more often if:  Your lipid or cholesterol levels are high.  You are older than 25 years of age.  You are at high risk for heart disease. What should I know about cancer  screening? Depending on your health history and family history, you may need to have cancer screening at various ages. This may include screening for:  Breast cancer.  Cervical cancer.  Colorectal cancer.  Skin cancer.  Lung cancer. What should I know about heart disease, diabetes, and high blood pressure? Blood pressure and heart disease  High blood pressure causes heart disease and increases the risk of stroke. This is more likely to develop in people who have high blood pressure readings, are of African descent, or are overweight.  Have your blood pressure checked: ? Every 3-5 years if you are 16-6 years of age. ? Every year if you are 8 years old or older. Diabetes Have regular diabetes screenings. This checks your fasting blood sugar level. Have the screening done:  Once every three years after age 59 if you are at a normal weight and have a low risk for diabetes.  More often and at a younger age if you are overweight or have a high risk for diabetes. What should I know about preventing infection? Hepatitis B If you have a higher risk for hepatitis B, you should be screened for this virus. Talk with your health care provider to find out if you are at risk for hepatitis B infection. Hepatitis C Testing is recommended for:  Everyone born from [birth year] through 1966.  Anyone with known risk factors for hepatitis C. Sexually transmitted infections (STIs)  Get screened for STIs, including gonorrhea and chlamydia, if: ? You are sexually active and are younger than 25 years of age. ? You are older than 25 years of age and your health care provider tells you that you are at risk for  this type of infection. ? Your sexual activity has changed since you were last screened, and you are at increased risk for chlamydia or gonorrhea. Ask your health care provider if you are at risk.  Ask your health care provider about whether you are at high risk for HIV. Your health care provider may  recommend a prescription medicine to help prevent HIV infection. If you choose to take medicine to prevent HIV, you should first get tested for HIV. You should then be tested every 3 months for as long as you are taking the medicine. Pregnancy  If you are about to stop having your period (premenopausal) and you may become pregnant, seek counseling before you get pregnant.  Take 400 to 800 micrograms (mcg) of folic acid every day if you become pregnant.  Ask for birth control (contraception) if you want to prevent pregnancy. Osteoporosis and menopause Osteoporosis is a disease in which the bones lose minerals and strength with aging. This can result in bone fractures. If you are 4 years old or older, or if you are at risk for osteoporosis and fractures, ask your health care provider if you should:  Be screened for bone loss.  Take a calcium or vitamin D supplement to lower your risk of fractures.  Be given hormone replacement therapy (HRT) to treat symptoms of menopause. Follow these instructions at home: Lifestyle  Do not use any products that contain nicotine or tobacco, such as cigarettes, e-cigarettes, and chewing tobacco. If you need help quitting, ask your health care provider.  Do not use street drugs.  Do not share needles.  Ask your health care provider for help if you need support or information about quitting drugs. Alcohol use  Do not drink alcohol if: ? Your health care provider tells you not to drink. ? You are pregnant, may be pregnant, or are planning to become pregnant.  If you drink alcohol: ? Limit how much you use to 0-1 drink a day. ? Limit intake if you are breastfeeding.  Be aware of how much alcohol is in your drink. In the [place] one drink equals one 12 oz bottle of beer (355 mL), one 5 oz glass of wine (148 mL), or one 1 oz glass of hard liquor (44 mL). General instructions  Schedule regular health, dental, and eye exams.  Stay current with your  vaccines.  Tell your health care provider if: ? You often feel depressed. ? You have ever been abused or do not feel safe at home. Summary  Adopting a healthy lifestyle and getting preventive care are important in promoting health and wellness.  Follow your health care provider's instructions about healthy diet, exercising, and getting tested or screened for diseases.  Follow your health care provider's instructions on monitoring your cholesterol and blood pressure. This information is not intended to replace advice given to you by your health care provider. Make sure you discuss any questions you have with your health care provider. Document Revised: 05/30/2018 Document Reviewed: 05/30/2018 Elsevier Patient Education  2020 [employer].

## 2019-11-20 ENCOUNTER — Encounter: Payer: Self-pay | Admitting: Rheumatology

## 2019-11-20 ENCOUNTER — Ambulatory Visit: Payer: 59 | Admitting: Rheumatology

## 2019-11-20 ENCOUNTER — Other Ambulatory Visit: Payer: Self-pay

## 2019-11-20 VITALS — BP 113/72 | HR 75 | Resp 12 | Ht 68.0 in | Wt 216.6 lb

## 2019-11-20 DIAGNOSIS — Z3041 Encounter for surveillance of contraceptive pills: Secondary | ICD-10-CM

## 2019-11-20 DIAGNOSIS — R7989 Other specified abnormal findings of blood chemistry: Secondary | ICD-10-CM

## 2019-11-20 DIAGNOSIS — R87811 Vaginal high risk human papillomavirus (HPV) DNA test positive: Secondary | ICD-10-CM

## 2019-11-20 DIAGNOSIS — Z79899 Other long term (current) drug therapy: Secondary | ICD-10-CM

## 2019-11-20 DIAGNOSIS — H209 Unspecified iridocyclitis: Secondary | ICD-10-CM

## 2019-11-20 DIAGNOSIS — G8929 Other chronic pain: Secondary | ICD-10-CM

## 2019-11-20 DIAGNOSIS — R768 Other specified abnormal immunological findings in serum: Secondary | ICD-10-CM

## 2019-11-20 DIAGNOSIS — M458 Ankylosing spondylitis sacral and sacrococcygeal region: Secondary | ICD-10-CM | POA: Diagnosis not present

## 2019-11-20 DIAGNOSIS — Z1589 Genetic susceptibility to other disease: Secondary | ICD-10-CM | POA: Diagnosis not present

## 2019-11-20 DIAGNOSIS — L0591 Pilonidal cyst without abscess: Secondary | ICD-10-CM

## 2019-11-20 DIAGNOSIS — M533 Sacrococcygeal disorders, not elsewhere classified: Secondary | ICD-10-CM

## 2019-11-20 NOTE — Patient Instructions (Signed)
Standing Labs We placed an order today for your standing lab work.    Please come back and get your standing labs in August and every 3 months   We have open lab daily Monday through Thursday from 8:30-12:30 PM and 1:30-4:30 PM and Friday from 8:30-12:30 PM and 1:30-4:00 PM at the office of Dr. Shaili Deveshwar.   You may experience shorter wait times on Monday and Friday afternoons. The office is located at [address], [address], [place], [place] [address] No appointment is necessary.   Labs are drawn by [facility].  You may receive a bill from [facility] for your lab work.  If you wish to have your labs drawn at another location, please call the office 24 hours in advance to send orders.  If you have any questions regarding directions or hours of operation,  please call [phone].   Just as a reminder please drink plenty of water prior to coming for your lab work. Thanks!   

## 2019-11-21 ENCOUNTER — Encounter: Payer: Self-pay | Admitting: Gastroenterology

## 2019-11-27 ENCOUNTER — Telehealth: Payer: Self-pay | Admitting: *Deleted

## 2019-11-27 NOTE — Telephone Encounter (Signed)
Patient called stating her birth control pills for Loestrin 10 mcg tablet has increased in price, she asked if another Rx could be sent to pharmacy? Please advise

## 2019-11-27 NOTE — Telephone Encounter (Signed)
She should do well on Junel Fe 1/20.  Can replace with that one.

## 2019-11-28 ENCOUNTER — Telehealth: Payer: Self-pay

## 2019-11-28 MED ORDER — NORETHIN ACE-ETH ESTRAD-FE 1-20 MG-MCG PO TABS
1.0000 | ORAL_TABLET | Freq: Every day | ORAL | 0 refills | Status: DC
Start: 2019-11-28 — End: 2020-01-23

## 2019-11-28 NOTE — Telephone Encounter (Signed)
Patient informed, Rx sent annual exam scheduled on 01/23/20

## 2019-11-28 NOTE — Telephone Encounter (Signed)
[employer] form dropped off 6/10 request to complete with Dr. Lucita Lora signature. [place] would like for [place] to call her when it is ready for pick up. She would like to pick up tomorrow, if at all possible, she needs to turn in completed form to [employer] on Monday or Tuesday of next week. Gave form to [place].  Thank you, Hinton Dyer

## 2019-11-29 NOTE — Telephone Encounter (Signed)
Copy made and sent to scan. Pt was called and VM was left letting pt know she could pick up.

## 2019-11-29 NOTE — Telephone Encounter (Signed)
Completed and returned to [place] work station.

## 2019-11-29 NOTE — Telephone Encounter (Signed)
Completed immunization paperwork. Placed on Dr Dierdre Highman desk to review and sign.

## 2019-12-19 ENCOUNTER — Telehealth: Payer: Self-pay | Admitting: *Deleted

## 2019-12-19 NOTE — Telephone Encounter (Signed)
Received a fax from [facility] stating they have made several attempts to contact the patient to fill her Humira. They have been unsuccessful. Call back number [phone].

## 2019-12-25 ENCOUNTER — Encounter: Payer: Self-pay | Admitting: Gastroenterology

## 2019-12-25 ENCOUNTER — Other Ambulatory Visit (INDEPENDENT_AMBULATORY_CARE_PROVIDER_SITE_OTHER): Payer: 59

## 2019-12-25 ENCOUNTER — Ambulatory Visit: Payer: 59 | Admitting: Gastroenterology

## 2019-12-25 DIAGNOSIS — R1013 Epigastric pain: Secondary | ICD-10-CM | POA: Diagnosis not present

## 2019-12-25 DIAGNOSIS — R7989 Other specified abnormal findings of blood chemistry: Secondary | ICD-10-CM

## 2019-12-25 LAB — COMPREHENSIVE METABOLIC PANEL
ALT: 22 U/L (ref 0–35)
AST: 18 U/L (ref 0–37)
Albumin: 4.2 g/dL (ref 3.5–5.2)
Alkaline Phosphatase: 109 U/L (ref 39–117)
BUN: 11 mg/dL (ref 6–23)
CO2: 23 mEq/L (ref 19–32)
Calcium: 9.5 mg/dL (ref 8.4–10.5)
Chloride: 105 mEq/L (ref 96–112)
Creatinine, Ser: 0.64 mg/dL (ref 0.40–1.20)
GFR: 113.41 mL/min (ref >60.00)
Glucose, Bld: 78 mg/dL (ref 70–99)
Potassium: 3.3 mEq/L — ABNORMAL LOW (ref 3.5–5.1)
Sodium: 138 mEq/L (ref 135–145)
Total Bilirubin: 0.5 mg/dL (ref 0.2–1.2)
Total Protein: 7.6 g/dL (ref 6.0–8.3)

## 2019-12-25 LAB — IGA: IgA: 258 mg/dL (ref 68–378)

## 2019-12-25 LAB — CBC
HCT: 39.9 % (ref 36.0–46.0)
Hemoglobin: 13.7 g/dL (ref 12.0–15.0)
MCHC: 34.4 g/dL (ref 30.0–36.0)
MCV: 83.6 fl (ref 78.0–100.0)
Platelets: 351 10*3/uL (ref 150.0–400.0)
RBC: 4.77 Mil/uL (ref 3.87–5.11)
RDW: 12.9 % (ref 11.5–15.5)
WBC: 4.9 10*3/uL (ref 4.0–10.5)

## 2019-12-25 LAB — IBC + FERRITIN
Ferritin: 26.9 ng/mL (ref 10.0–291.0)
Iron: 68 ug/dL (ref 42–145)
Saturation Ratios: 16.4 % — ABNORMAL LOW (ref 20.0–50.0)
Transferrin: 296 mg/dL (ref 212.0–360.0)

## 2019-12-25 LAB — PROTIME-INR
INR: 1 ratio (ref 0.8–1.0)
Prothrombin Time: 11.1 s (ref 9.6–13.1)

## 2019-12-25 NOTE — Progress Notes (Signed)
HPI: This is a very pleasant 24 year old woman who was referred by her rheumatologist Dr. Patrecia Pour for elevated liver tests.  She has ankylosing spondylitis and has been on Humira for that.  She has intermittent iritis and her rheumatologist wanted to start her on methotrexate however noticed on blood work that her LFTs were slightly elevated.  The patient has never heard that her liver numbers were elevated at all.  Liver disease does not run in her family.  She does drink alcohol but rarely and only at quite low volume.  She tells me she is about the heaviest she has ever been however she has lost a bit of weight in the past 6 months or so.   Interestingly she has had intermittent epigastric cramping which is colicky in nature occurring quite frequently last summer but now occurs about only once every few weeks.  Can last several hours, but colicky pains get severe for about 30 seconds.  It can be associate with nausea and vomiting.   Old Data Reviewed:   Complete metabolic profile December 2020 was normal. Complete metabolic profile April 1610 showed AST 44, ALT 74, otherwise it was completely normal Blood work May 2021 AST 34, ALT 61;    Review of systems: Pertinent positive and negative review of systems were noted in the above HPI section. All other review negative.   Past Medical History:  Diagnosis Date  . Ankylosing spondylitis ([place])   . Iritis   . Vertigo     Past Surgical History:  Procedure Laterality Date  . BREAST REDUCTION SURGERY  2015  . HAND SURGERY Left 07/24/2019    Current Outpatient Medications  Medication Sig Dispense Refill  . HUMIRA PEN 40 MG/0.4ML PNKT INJECT 40MG  SUBCUTANEOUSLY  EVERY 14 DAYS (Patient taking differently: Inject 40 mg into the skin once a week. ) 6 each 0  . norethindrone-ethinyl estradiol (JUNEL FE 1/20) 1-20 MG-MCG tablet Take 1 tablet by mouth daily. 84 tablet 0   No current facility-administered medications for this  visit.    Allergies as of 12/25/2019  . (No Known Allergies)    Family History  Problem Relation Age of Onset  . Hypertension Mother   . Testicular cancer Father   . Healthy Sister   . Healthy Brother   . Uterine cancer Maternal Aunt   . Healthy Brother   . Depression Sister   . Anxiety disorder Sister     Social History   Socioeconomic History  . Marital status: Single    Spouse name: Not on file  . Number of children: 0  . Years of education: 66  . Highest education level: Not on file  Occupational History  . Occupation: Consulting civil engineer  Tobacco Use  . Smoking status: Never Smoker  . Smokeless tobacco: Never Used  Vaping Use  . Vaping Use: Never used  Substance and Sexual Activity  . Alcohol use: Not Currently  . Drug use: No  . Sexual activity: Yes    Partners: Male    Birth control/protection: Pill    Comment: 1st intercourse- 16, partners- 82, current partner - 2 yrs  Other Topics Concern  . Not on file  Social History Narrative   Single. Some college.    Works as Therapist, nutritional.    Drinks caffeine.    Wears seatbelt. Smoke detector in the home.    Exercises routinely.    Feels safe in relationships.       Social Determinants of Health  Financial Resource Strain:   . Difficulty of Paying Living Expenses:   Food Insecurity:   . Worried About Charity fundraiser in the Last Year:   . Arboriculturist in the Last Year:   Transportation Needs:   . Film/video editor (Medical):   Marland Kitchen Lack of Transportation (Non-Medical):   Physical Activity:   . Days of Exercise per Week:   . Minutes of Exercise per Session:   Stress:   . Feeling of Stress :   Social Connections:   . Frequency of Communication with Friends and Family:   . Frequency of Social Gatherings with Friends and Family:   . Attends Religious Services:   . Active Member of Clubs or Organizations:   . Attends Archivist Meetings:   Marland Kitchen Marital Status:   Intimate Partner  Violence:   . Fear of Current or Ex-Partner:   . Emotionally Abused:   Marland Kitchen Physically Abused:   . Sexually Abused:      Physical Exam: BP 116/74   Pulse 85   Ht 5\' 8"  (1.727 m)   Wt 214 lb (97.1 kg)   BMI 32.54 kg/m  Constitutional: generally well-appearing Psychiatric: alert and oriented x3 Eyes: extraocular movements intact Mouth: oral pharynx moist, no lesions Neck: supple no lymphadenopathy Cardiovascular: heart regular rate and rhythm Lungs: clear to auscultation bilaterally Abdomen: soft, nontender, nondistended, no obvious ascites, no peritoneal signs, normal bowel sounds Extremities: no lower extremity edema bilaterally Skin: no lesions on visible extremities   Assessment and plan: 24 y.o. female with elevated transaminases, also intermittent epigastric pains  First her intermittent epigastric pains seem somewhat biliary in nature.  As part of her elevated liver test work-up I would be getting a ultrasound of her liver and I think that that will also evaluate her gallbladder for gallstones quite well.  If she does have gallstones I will probably recommend that she have her gallbladder removed given her recurrent symptoms of biliary-like pain.  I do not know if this is at all related to her mildly elevated transaminases.  I recommended a battery of blood test to check for the usual causes, see those blood tests all in her AVS.  I did mention that fatty liver is quite common and since she admits that she is the heaviest she has ever been certainly that is high in the list of possible etiologies.  After the work-up I will try to advise her as best I can about starting methotrexate.  Please see the "Patient Instructions" section for addition details about the plan.   Owens Loffler, MD [facility] 12/25/2019, 2:49 PM  Cc: Ofilia Neas, PA-C  Total time on date of encounter was 46 minutes (this included time spent preparing to see the patient reviewing  records; obtaining and/or reviewing separately obtained history; performing a medically appropriate exam and/or evaluation; counseling and educating the patient and family if present; ordering medications, tests or procedures if applicable; and documenting clinical information in the health record).

## 2019-12-25 NOTE — Patient Instructions (Addendum)
If you are age 25 or older, your body mass index should be between 23-30. Your Body mass index is 32.54 kg/m. If this is out of the aforementioned range listed, please consider follow up with your Primary Care Provider.  If you are age 13 or younger, your body mass index should be between 19-25. Your Body mass index is 32.54 kg/m. If this is out of the aformentioned range listed, please consider follow up with your Primary Care Provider.   Your provider has requested that you go to the basement level for lab work before leaving today. Press "B" on the elevator. The lab is located at the first door on the left as you exit the elevator.  Due to recent changes in healthcare laws, you may see the results of your imaging and laboratory studies on MyChart before your provider has had a chance to review them.  We understand that in some cases there may be results that are confusing or concerning to you. Not all laboratory results come back in the same time frame and the provider may be waiting for multiple results in order to interpret others.  Please give [place] 48 hours in order for your provider to thoroughly review all the results before contacting the office for clarification of your results.   You have been scheduled for an abdominal ultrasound at [facility] (1st floor of hospital) on 12/31/19 at 10:30am. Please arrive 15 minutes prior to your appointment for registration. Make certain not to have anything to eat or drink after midnight prior to your appointment. Should you need to reschedule your appointment, please contact radiology at [phone]. This test typically takes about 30 minutes to perform.  Thank you for entrusting me with your care and choosing [facility].  Dr Ardis Hughs

## 2019-12-27 ENCOUNTER — Telehealth: Payer: Self-pay

## 2019-12-27 NOTE — Telephone Encounter (Signed)
-----   Message -----  From: Carole Binning, LPN  Sent: 01/21/1281  2:10 PM EDT  To: Deboraha Sprang, RPH-CPP  Subject: FW: Appointment Request               Can you contact the patient and discuss medication side effects? Thanks!   ----- Message -----  From: Duard Brady  Sent: 12/24/2019  9:52 AM EDT  To: Carole Binning, LPN  Subject: FW: Appointment Request               Please call patient to find out is back, and eye pain is medication related? Discuss eye doctor referral, and verify if follow up appointment is needed at this time.     ----- Message -----  From: Laurann Montana, RT  Sent: 12/24/2019  9:09 AM EDT  To: April J Beavers  Subject: FW: Appointment Request                 ----- Message -----  From: Rona Ravens  Sent: 12/24/2019  8:50 AM EDT  To: [facility]  Subject: Appointment Request                 Appointment Request From: Rona Ravens    With Provider: Bo Merino, MD [facility]]    Preferred Date Range: 12/24/2019 - 01/07/2020    Preferred Times: Monday Morning, Tuesday Morning, Wednesday Morning, Thursday Morning, Friday Morning    Reason for visit: Office Visit    Comments:  I have continued back pain and eye pain and possibly medication side effects. Also would like a referral for ophthalmology.

## 2019-12-27 NOTE — Telephone Encounter (Signed)
Attempted to contact patient and left message on machine for patient to call the office.

## 2019-12-27 NOTE — Telephone Encounter (Signed)
Patient advised a fax from [facility] stating they have made several attempts to contact the patient to fill her Humira. Patient states she has been in contact with them and they are due to ship her medication.

## 2019-12-29 LAB — CERULOPLASMIN: Ceruloplasmin: 59 mg/dL — ABNORMAL HIGH (ref 18–53)

## 2019-12-29 LAB — MITOCHONDRIAL ANTIBODIES: Mitochondrial M2 Ab, IgG: <=20 U

## 2019-12-29 LAB — HEPATITIS C ANTIBODY
Hepatitis C Ab: NONREACTIVE
SIGNAL TO CUT-OFF: 0.01 (ref <1.00)

## 2019-12-29 LAB — ALPHA-1-ANTITRYPSIN: A-1 Antitrypsin, Ser: 248 mg/dL — ABNORMAL HIGH (ref 83–199)

## 2019-12-29 LAB — HEPATITIS B SURFACE ANTIGEN: Hepatitis B Surface Ag: NONREACTIVE

## 2019-12-29 LAB — HEPATITIS B SURFACE ANTIBODY,QUALITATIVE: Hep B S Ab: NONREACTIVE

## 2019-12-29 LAB — ANA: Anti Nuclear Antibody (ANA): POSITIVE — AB

## 2019-12-29 LAB — ANTI-NUCLEAR AB-TITER (ANA TITER): ANA Titer 1: 1:320 {titer} — ABNORMAL HIGH

## 2019-12-29 LAB — TISSUE TRANSGLUTAMINASE, IGA: (tTG) Ab, IgA: 1 U/mL

## 2019-12-29 LAB — ANTI-SMOOTH MUSCLE ANTIBODY, IGG: Actin (Smooth Muscle) Antibody (IGG): <20 U (ref <20)

## 2019-12-29 LAB — HEPATITIS A ANTIBODY, TOTAL: Hepatitis A AB,Total: REACTIVE — AB

## 2019-12-31 ENCOUNTER — Ambulatory Visit (HOSPITAL_COMMUNITY): Payer: 59

## 2020-01-01 ENCOUNTER — Ambulatory Visit (HOSPITAL_COMMUNITY)
Admission: RE | Admit: 2020-01-01 | Discharge: 2020-01-01 | Disposition: A | Discharge status: Home / Self Care | Payer: 59 | Source: Ambulatory Visit | Attending: Gastroenterology | Admitting: Gastroenterology

## 2020-01-01 ENCOUNTER — Other Ambulatory Visit: Payer: Self-pay

## 2020-01-01 DIAGNOSIS — R1013 Epigastric pain: Secondary | ICD-10-CM | POA: Insufficient documentation

## 2020-01-01 DIAGNOSIS — R7989 Other specified abnormal findings of blood chemistry: Secondary | ICD-10-CM | POA: Insufficient documentation

## 2020-01-03 ENCOUNTER — Other Ambulatory Visit: Payer: Self-pay

## 2020-01-03 DIAGNOSIS — R7989 Other specified abnormal findings of blood chemistry: Secondary | ICD-10-CM

## 2020-01-06 NOTE — Progress Notes (Signed)
I called patient today.  She states she did not start methotrexate.  She states her iritis is controlled with Humira.  She does not want to start methotrexate at this point.  She states she has been having a lot of discomfort in her SI joints and would like to have a cortisone injection.  Please to schedule an appointment for SI joint injections.

## 2020-01-08 ENCOUNTER — Ambulatory Visit: Payer: 59 | Admitting: Rheumatology

## 2020-01-08 ENCOUNTER — Other Ambulatory Visit: Payer: Self-pay

## 2020-01-08 VITALS — BP 116/79 | HR 72

## 2020-01-08 DIAGNOSIS — G8929 Other chronic pain: Secondary | ICD-10-CM

## 2020-01-08 DIAGNOSIS — M533 Sacrococcygeal disorders, not elsewhere classified: Secondary | ICD-10-CM

## 2020-01-08 MED ORDER — TRIAMCINOLONE ACETONIDE 40 MG/ML IJ SUSP
40.0000 mg | INTRAMUSCULAR | Status: AC | PRN
  Administered 2020-01-08: 40 mg via INTRA_ARTICULAR

## 2020-01-08 MED ORDER — LIDOCAINE HCL 1 % IJ SOLN
1.0000 mL | INTRAMUSCULAR | Status: AC | PRN
Start: 2020-01-08 — End: 2020-01-08
  Administered 2020-01-08: 1 mL

## 2020-01-08 MED ORDER — LIDOCAINE HCL 1 % IJ SOLN
1.0000 mL | INTRAMUSCULAR | Status: AC | PRN
  Administered 2020-01-08: 1 mL

## 2020-01-08 NOTE — Progress Notes (Signed)
   Procedure Note  Patient: Toni Livingston             Date of Birth: [birth year]           MRN: [id number]             Visit Date: 01/08/2020  Procedures: Visit Diagnoses:  1. Chronic SI joint pain     Sacroiliac Joint Inj on 01/08/2020 11:42 AM Indications: pain Details: 27 G 1.5 in needle Medications (Right): 1 mL lidocaine 1 %; 40 mg triamcinolone acetonide 40 MG/ML Aspirate (Right): 0 mL Medications (Left): 1 mL lidocaine 1 %; 40 mg triamcinolone acetonide 40 MG/ML Aspirate (Left): 0 mL Outcome: tolerated well, no immediate complications Procedure, treatment alternatives, risks and benefits explained, specific risks discussed. Consent was given by the patient. Immediately prior to procedure a time out was called to verify the correct patient, procedure, equipment, support staff and site/side marked as required. Patient was prepped and draped in the usual sterile fashion.     Post procedure instructions were discussed.  Bo Merino, MD

## 2020-01-15 ENCOUNTER — Other Ambulatory Visit: Payer: Self-pay | Admitting: *Deleted

## 2020-01-15 DIAGNOSIS — M458 Ankylosing spondylitis sacral and sacrococcygeal region: Secondary | ICD-10-CM

## 2020-01-15 MED ORDER — HUMIRA (2 PEN) 40 MG/0.4ML ~~LOC~~ AJKT: AUTO-INJECTOR | SUBCUTANEOUS | 0 refills | Status: DC

## 2020-01-15 NOTE — Telephone Encounter (Signed)
Last Visit: 11/20/2019 Next Visit: 02/20/2020 Labs: 12/25/2019 Potassium 3.3 TB Gold: 04/10/2019 Neg   Current Dose per office note 11/20/2019:Humira 40 mg sq injections every week for 8 weeks and then will resume  Humira 40 mg sq injections every every 14 days  DX: Ankylosing Spondylitis  Okay to refill Humira?

## 2020-01-23 ENCOUNTER — Other Ambulatory Visit: Payer: Self-pay

## 2020-01-23 ENCOUNTER — Ambulatory Visit: Payer: 59 | Admitting: Obstetrics & Gynecology

## 2020-01-23 ENCOUNTER — Encounter: Payer: Self-pay | Admitting: Obstetrics & Gynecology

## 2020-01-23 VITALS — BP 112/70 | Ht 68.0 in | Wt 207.0 lb

## 2020-01-23 DIAGNOSIS — N870 Mild cervical dysplasia: Secondary | ICD-10-CM | POA: Diagnosis not present

## 2020-01-23 DIAGNOSIS — E6609 Other obesity due to excess calories: Secondary | ICD-10-CM

## 2020-01-23 DIAGNOSIS — Z1151 Encounter for screening for human papillomavirus (HPV): Secondary | ICD-10-CM

## 2020-01-23 DIAGNOSIS — E66811 Obesity, class 1: Secondary | ICD-10-CM

## 2020-01-23 DIAGNOSIS — Z01419 Encounter for gynecological examination (general) (routine) without abnormal findings: Secondary | ICD-10-CM

## 2020-01-23 DIAGNOSIS — Z3041 Encounter for surveillance of contraceptive pills: Secondary | ICD-10-CM | POA: Diagnosis not present

## 2020-01-23 DIAGNOSIS — Z6831 Body mass index (BMI) 31.0-31.9, adult: Secondary | ICD-10-CM

## 2020-01-23 MED ORDER — NORETHIN ACE-ETH ESTRAD-FE 1-20 MG-MCG PO TABS: 1.0000 | ORAL_TABLET | Freq: Every day | ORAL | 4 refills | Status: DC

## 2020-01-23 NOTE — Progress Notes (Signed)
Toni Livingston [birth year] [id number]   History:    24 y.o.   G0 Engaged.  Stable boyfriend x 3 yrs, getting married next week.  RP: Established patient presenting for Annual Gyn exam  HPI: Well on Junel Fe 1/20.  No BTB.  No pelvic pain.  Stable boyfriend for 3 years, getting married.  CIN 1 on [place] 11/2018.  HPV HR pos.  Gonorrhea and Chlamydia were negative on Nov 14, 2018.  Urine/BMs normal.  Breasts s/p bilateral reduction.  BMI 31.47.  Hiking.     Past medical history,surgical history, family history and social history were all reviewed and documented in the EPIC chart.  Gynecologic History Patient's last menstrual period was 01/15/2020.  Obstetric History OB History  Gravida Para Term Preterm AB Living  0 0 0 0 0 0  SAB TAB Ectopic Multiple Live Births  0 0 0 0 0     ROS: A ROS was performed and pertinent positives and negatives are included in the history.  GENERAL: No fevers or chills. HEENT: No change in vision, no earache, sore throat or sinus congestion. NECK: No pain or stiffness. CARDIOVASCULAR: No chest pain or pressure. No palpitations. PULMONARY: No shortness of breath, cough or wheeze. GASTROINTESTINAL: No abdominal pain, nausea, vomiting or diarrhea, melena or bright red blood per rectum. GENITOURINARY: No urinary frequency, urgency, hesitancy or dysuria. MUSCULOSKELETAL: No joint or muscle pain, no back pain, no recent trauma. DERMATOLOGIC: No rash, no itching, no lesions. ENDOCRINE: No polyuria, polydipsia, no heat or cold intolerance. No recent change in weight. HEMATOLOGICAL: No anemia or easy bruising or bleeding. NEUROLOGIC: No headache, seizures, numbness, tingling or weakness. PSYCHIATRIC: No depression, no loss of interest in normal activity or change in sleep pattern.     Exam:   BP 112/70   Ht 5\' 8"  (1.727 m)   Wt 207 lb (93.9 kg)   LMP 01/15/2020 Comment: pill  BMI 31.47 kg/m   Body mass index is 31.47 kg/m.  General appearance : Well  developed well nourished female. No acute distress HEENT: Eyes: no retinal hemorrhage or exudates,  Neck supple, trachea midline, no carotid bruits, no thyroidmegaly Lungs: Clear to auscultation, no rhonchi or wheezes, or rib retractions  Heart: Regular rate and rhythm, no murmurs or gallops Breast:Examined in sitting and supine position were symmetrical in appearance, no palpable masses or tenderness,  no skin retraction, no nipple inversion, no nipple discharge, no skin discoloration, no axillary or supraclavicular lymphadenopathy Abdomen: no palpable masses or tenderness, no rebound or guarding Extremities: no edema or skin discoloration or tenderness  Pelvic: Vulva: Normal             Vagina: No gross lesions or discharge  Cervix: No gross lesions or discharge.  Pap/HPV HR done.  Uterus  AV, normal size, shape and consistency, non-tender and mobile  Adnexa  Without masses or tenderness  Anus: Normal   Assessment/Plan:  24 y.o. female for annual exam   1. Encounter for routine gynecological examination with Papanicolaou smear of cervix Normal gynecologic exam.  Pap test with high-risk HPV done today.  Breast exam normal.  2. Dysplasia of cervix, low grade (CIN 1) Pap/HPV HR done today.  3. Encounter for surveillance of contraceptive pills Well on Junel Fe 1/20.  No contraindication to continue.  Prescription sent to pharmacy.  4. Class 1 obesity due to excess calories without serious comorbidity with body mass index (BMI) of 31.0 to 31.9 in adult Recommend a lower calorie/carb diet.  Aerobic activities 5 times a week and light weightlifting every 2 days.  Other orders - norethindrone-ethinyl estradiol (JUNEL FE 1/20) 1-20 MG-MCG tablet; Take 1 tablet by mouth daily.  Princess Bruins MD, 11:43 AM 01/23/2020

## 2020-01-24 LAB — PAP, TP IMAGING W/ HPV RNA, RFLX HPV TYPE 16,18/45: HPV DNA High Risk: NOT DETECTED

## 2020-02-07 NOTE — Progress Notes (Signed)
Office Visit Note  Patient: Toni Livingston             Date of Birth: [birth year]           MRN: [id number]             PCP: Ma Hillock, DO Referring: Ma Hillock, DO Visit Date: 02/20/2020 Occupation: @GUAROCC @  Subjective:  Medication monitoring.   History of Present Illness: Toni Livingston is a 24 y.o. female with history of ankylosing spondylitis.  She states initially she had very good response to Humira.  But in the last couple of months she was having increased lower back pain off and on.  She came here on July 21 to get SI joint injections.  She had good response to cortisone injection.  She is doing quite well currently.  None of the other joints are painful.  She denies any Achilles tendinitis, plantar fasciitis or eye inflammation.  There is no shortness of breath.  Activities of Daily Living:  Patient reports morning stiffness for 30 minutes.   Patient Reports nocturnal pain.  Difficulty dressing/grooming: Denies Difficulty climbing stairs: Denies Difficulty getting out of chair: Reports Difficulty using hands for taps, buttons, cutlery, and/or writing: Denies  Review of Systems  Constitutional: Positive for fatigue.  HENT: Negative for mouth sores, mouth dryness and nose dryness.   Eyes: Negative for photophobia, pain, redness, itching, visual disturbance and dryness.  Respiratory: Negative for shortness of breath and difficulty breathing.   Cardiovascular: Negative for chest pain and palpitations.  Gastrointestinal: Negative for blood in stool, constipation and diarrhea.  Endocrine: Negative for increased urination.  Genitourinary: Negative for difficulty urinating.  Musculoskeletal: Positive for arthralgias, joint pain and morning stiffness. Negative for joint swelling, myalgias, muscle tenderness and myalgias.  Skin: Negative for color change, rash and redness.  Allergic/Immunologic: Negative for susceptible to infections.  Neurological: Positive for  headaches. Negative for dizziness, numbness, memory loss and weakness.  Hematological: Positive for bruising/bleeding tendency.  Psychiatric/Behavioral: Negative for confusion.    PMFS History:  Patient Active Problem List   Diagnosis Date Noted  . Closed fracture of fifth metacarpal bone 07/19/2019  . HLA B27 (HLA B27 positive) 04/16/2019  . ANA positive 04/16/2019  . Iritis 04/10/2019  . DDD (degenerative disc disease), lumbar 04/10/2019  . Vaginal high risk HPV DNA test positive 11/19/2018  . Obesity (BMI 30-39.9) 11/14/2018  . Pilonidal cyst 11/14/2018  . LGSIL of cervix of undetermined significance 11/14/2018  . Irregular menses 11/14/2018  . Encounter for well woman exam with routine gynecological exam 09/13/2017  . Oral contraceptive use 07/27/2016  . Encounter for long-term current use of medication 07/27/2016    Past Medical History:  Diagnosis Date  . Ankylosing spondylitis ([place])   . Iritis   . Vertigo     Family History  Problem Relation Age of Onset  . Hypertension Mother   . Testicular cancer Father   . Healthy Sister   . Healthy Brother   . Uterine cancer Maternal Aunt   . Healthy Brother   . Depression Sister   . Anxiety disorder Sister    Past Surgical History:  Procedure Laterality Date  . BREAST REDUCTION SURGERY  2015  . HAND SURGERY Left 07/24/2019   Social History   Social History Narrative   Single. Some college.    Works as Therapist, nutritional.    Drinks caffeine.    Wears seatbelt. Smoke detector in the home.    Exercises routinely.  Feels safe in relationships.       Immunization History  Administered Date(s) Administered  . Influenza,inj,Quad PF,6+ Mos 03/09/2017, 03/05/2018, 02/27/2019  . Influenza-Unspecified 03/20/2016  . PPD Test 07/27/2016  . Tdap 07/27/2016     Objective: Vital Signs: BP 124/84 (BP Location: Left Arm, Patient Position: Sitting, Cuff Size: Normal)   Pulse 70   Resp 16   Ht 5' 8"  (1.727 m)   Wt 205 lb  12.8 oz (93.4 kg)   BMI 31.29 kg/m    Physical Exam Vitals and nursing note reviewed.  Constitutional:      Appearance: She is well-developed.  HENT:     Head: Normocephalic and atraumatic.  Eyes:     Conjunctiva/sclera: Conjunctivae normal.  Cardiovascular:     Rate and Rhythm: Normal rate and regular rhythm.     Heart sounds: Normal heart sounds.  Pulmonary:     Effort: Pulmonary effort is normal.     Breath sounds: Normal breath sounds.  Abdominal:     General: Bowel sounds are normal.     Palpations: Abdomen is soft.  Musculoskeletal:     Cervical back: Normal range of motion.  Lymphadenopathy:     Cervical: No cervical adenopathy.  Skin:    General: Skin is warm and dry.     Capillary Refill: Capillary refill takes less than 2 seconds.  Neurological:     Mental Status: She is alert and oriented to person, place, and time.  Psychiatric:        Behavior: Behavior normal.      Musculoskeletal Exam: C-spine, thoracic and lumbar spine were in good range of motion.  She had no SI joint tenderness.  Shoulder joints, elbow joints, wrist joints, MCPs PIPs and DIPs with good range of motion with no synovitis.  Hip joints, knee joints, ankles, MTPs PIPs and DIPs with good range of motion.  There was no evidence of Achilles tendinitis or plantar fasciitis.  CDAI Exam: CDAI Score: -- Patient Global: --; Provider Global: -- Swollen: --; Tender: -- Joint Exam 02/20/2020   No joint exam has been documented for this visit   There is currently no information documented on the homunculus. Go to the Rheumatology activity and complete the homunculus joint exam.  Investigation: No additional findings.  Imaging: No results found.  Recent Labs: Lab Results  Component Value Date   WBC 4.9 12/25/2019   HGB 13.7 12/25/2019   PLT 351.0 12/25/2019   NA 138 12/25/2019   K 3.3 (L) 12/25/2019   CL 105 12/25/2019   CO2 23 12/25/2019   GLUCOSE 78 12/25/2019   BUN 11 12/25/2019    CREATININE 0.64 12/25/2019   BILITOT 0.5 12/25/2019   ALKPHOS 109 12/25/2019   AST 18 12/25/2019   ALT 22 12/25/2019   PROT 7.6 12/25/2019   ALBUMIN 4.2 12/25/2019   CALCIUM 9.5 12/25/2019   GFRAA 123 10/09/2019   QFTBGOLDPLUS NEGATIVE 04/10/2019    Speciality Comments: No specialty comments available.  Procedures:  No procedures performed Allergies: Patient has no known allergies.   Assessment / Plan:     Visit Diagnoses: Ankylosing spondylitis of sacral region [facility]) - History of chronic SI joint pain, SI joint sclerosis on the x-rays, HLA-B27 positive.  She had SI joint injections in July.  She had very good response to it.  She had no SI joint tenderness.  She had good mobility.  Need for regular exercise was emphasized.  I also discussed as her liver functions are normal  now she can occasionally take NSAIDs if needed.  High risk medication use - Humira 40 mg sq injections every week for 8 weeks and then will resume injections every 14 days.  (started on 05/09/19). - Plan: QuantiFERON-TB Gold Plus.  Labs were reviewed today.  Her labs have been normal.  We will check labs again in October.  She will need TB Gold in October.  Elevated LFTs - AST was 34 and ALT was 61 on 10/25/2019.  She was was evaluated by gastroenterologist and no cause was established.  HLA B27 positive  Iritis - Recurrent iritis since 2016: She had no iritis recently.  Chronic SI joint pain-improved  ANA positive  Oral contraceptive use  Pilonidal cyst  Vaginal high risk HPV DNA test positive  Other insomnia  Patient has not had COVID-19 vaccination yet.  We had detailed discussion regarding getting COVID-19 vaccination.  I have advised her to continue to use mask, social distancing and hand hygiene.  Orders: Orders Placed This Encounter  Procedures  . QuantiFERON-TB Gold Plus   No orders of the defined types were placed in this encounter.   Follow-Up Instructions: Return in about 3 months  (around 05/21/2020) for Ankylosing spondylitis.   Bo Merino, MD  Note - This record has been created using Editor, commissioning.  Chart creation errors have been sought, but may not always  have been located. Such creation errors do not reflect on  the standard of medical care.

## 2020-02-11 NOTE — Telephone Encounter (Signed)
Spoke with patient and informed her. Recall letter on file.

## 2020-02-20 ENCOUNTER — Encounter: Payer: Self-pay | Admitting: Rheumatology

## 2020-02-20 ENCOUNTER — Ambulatory Visit: Payer: 59 | Admitting: Rheumatology

## 2020-02-20 ENCOUNTER — Other Ambulatory Visit: Payer: Self-pay

## 2020-02-20 VITALS — BP 124/84 | HR 70 | Resp 16 | Ht 68.0 in | Wt 205.8 lb

## 2020-02-20 DIAGNOSIS — R7989 Other specified abnormal findings of blood chemistry: Secondary | ICD-10-CM

## 2020-02-20 DIAGNOSIS — R87811 Vaginal high risk human papillomavirus (HPV) DNA test positive: Secondary | ICD-10-CM

## 2020-02-20 DIAGNOSIS — R768 Other specified abnormal immunological findings in serum: Secondary | ICD-10-CM

## 2020-02-20 DIAGNOSIS — Z1589 Genetic susceptibility to other disease: Secondary | ICD-10-CM

## 2020-02-20 DIAGNOSIS — G4709 Other insomnia: Secondary | ICD-10-CM

## 2020-02-20 DIAGNOSIS — G8929 Other chronic pain: Secondary | ICD-10-CM

## 2020-02-20 DIAGNOSIS — Z3041 Encounter for surveillance of contraceptive pills: Secondary | ICD-10-CM

## 2020-02-20 DIAGNOSIS — R7689 Other specified abnormal immunological findings in serum: Secondary | ICD-10-CM

## 2020-02-20 DIAGNOSIS — M533 Sacrococcygeal disorders, not elsewhere classified: Secondary | ICD-10-CM

## 2020-02-20 DIAGNOSIS — M458 Ankylosing spondylitis sacral and sacrococcygeal region: Secondary | ICD-10-CM | POA: Diagnosis not present

## 2020-02-20 DIAGNOSIS — Z79899 Other long term (current) drug therapy: Secondary | ICD-10-CM | POA: Diagnosis not present

## 2020-02-20 DIAGNOSIS — H209 Unspecified iridocyclitis: Secondary | ICD-10-CM

## 2020-02-20 DIAGNOSIS — L0591 Pilonidal cyst without abscess: Secondary | ICD-10-CM

## 2020-02-20 NOTE — Patient Instructions (Addendum)
COVID-19 vaccine recommendations:   COVID-19 vaccine is recommended for everyone (unless you are allergic to a vaccine component), even if you are on a medication that suppresses your immune system.   If you are on Methotrexate, Cellcept (mycophenolate), Rinvoq, Morrie Sheldon, and Olumiant- hold the medication for 1 week after each vaccine. Hold Methotrexate for 2 weeks after the single dose COVID-19 vaccine.   If you are on Orencia subcutaneous injection - hold medication one week prior to and one week after the first COVID-19 vaccine dose (only).   If you are on Orencia IV infusions- time vaccination administration so that the first COVID-19 vaccination will occur four weeks after the infusion and postpone the subsequent infusion by one week.   If you are on Cyclophosphamide or Rituxan infusions please contact your doctor prior to receiving the COVID-19 vaccine.   Do not take Tylenol or any anti-inflammatory medications (NSAIDs) 24 hours prior to the COVID-19 vaccination.   There is no direct evidence about the efficacy of the COVID-19 vaccine in individuals who are on medications that suppress the immune system.   Even if you are fully vaccinated, and you are on any medications that suppress your immune system, please continue to wear a mask, maintain at least six feet social distance and practice hand hygiene.   If you develop a COVID-19 infection, please contact your PCP or our office to determine if you need antibody infusion.  The booster vaccine is now available for immunocompromised patients. It is advised that if you had Pfizer vaccine you should get Coca-Cola booster.  If you had a Moderna vaccine then you should get a Moderna booster. Johnson and Wynetta Emery does not have a booster vaccine at this time.  Please see the following web sites for updated information.    https://www.rheumatology.org/Portals/0/Files/COVID-19-Vaccination-Patient-Resources.pdf  https://www.rheumatology.org/About-Us/Newsroom/Press-Releases/ID/1159   Standing Labs We placed an order today for your standing lab work.   Please have your standing labs drawn in October and every 67months  TB Gold in October  If possible, please have your labs drawn 2 weeks prior to your appointment so that the provider can discuss your results at your appointment.  We have open lab daily Monday through Thursday from 8:30-12:30 PM and 1:30-4:30 PM and Friday from 8:30-12:30 PM and 1:30-4:00 PM at the office of Dr. Bo Merino, [facility].   Please be advised, patients with office appointments requiring lab work will take precedents over walk-in lab work.  If possible, please come for your lab work on Monday and Friday afternoons, as you may experience shorter wait times. The office is located at [address] [place], [place], [place] [address] No appointment is necessary.   Labs are drawn by [facility]. Please bring your co-pay at the time of your lab draw.  You may receive a bill from [facility] for your lab work.  If you wish to have your labs drawn at another location, please call the office 24 hours in advance to send orders.  If you have any questions regarding directions or hours of operation,  please call [phone].   As a reminder, please drink plenty of water prior to coming for your lab work. Thanks!

## 2020-02-21 ENCOUNTER — Other Ambulatory Visit: Payer: Self-pay | Admitting: Obstetrics & Gynecology

## 2020-03-31 ENCOUNTER — Other Ambulatory Visit: Payer: Self-pay | Admitting: Physician Assistant

## 2020-03-31 DIAGNOSIS — M458 Ankylosing spondylitis sacral and sacrococcygeal region: Secondary | ICD-10-CM

## 2020-03-31 NOTE — Telephone Encounter (Signed)
Last Visit: 02/20/2020 Next Visit: 05/27/2020 Labs: 12/25/2019 potassium 3.3, CBC WNL  TB Gold: 04/10/2019 negative   Current Dose per office note on 02/20/2020: Humira 40 mg sq injections every week for 8 weeks and then will resume injections every 14 days.  JY:[id number] spondylitis of sacral region  Attempted to contact patient and left message on machine to advise patient she is due to update labs.   Okay to refill 30 day supply of humira?

## 2020-04-01 ENCOUNTER — Other Ambulatory Visit: Payer: Self-pay

## 2020-04-01 DIAGNOSIS — Z79899 Other long term (current) drug therapy: Secondary | ICD-10-CM

## 2020-04-02 NOTE — Progress Notes (Signed)
CBC and CMP are normal.

## 2020-04-03 LAB — CBC WITH DIFFERENTIAL/PLATELET
Absolute Monocytes: 423 cells/uL (ref 200–950)
Basophils Absolute: 39 cells/uL (ref 0–200)
Basophils Relative: 0.6 %
Eosinophils Absolute: 117 cells/uL (ref 15–500)
Eosinophils Relative: 1.8 %
HCT: 41 % (ref 35.0–45.0)
Hemoglobin: 13.7 g/dL (ref 11.7–15.5)
Lymphs Abs: 1242 cells/uL (ref 850–3900)
MCH: 28.8 pg (ref 27.0–33.0)
MCHC: 33.4 g/dL (ref 32.0–36.0)
MCV: 86.1 fL (ref 80.0–100.0)
MPV: 10.3 fL (ref 7.5–12.5)
Monocytes Relative: 6.5 %
Neutro Abs: 4680 cells/uL (ref 1500–7800)
Neutrophils Relative %: 72 %
Platelets: 372 10*3/uL (ref 140–400)
RBC: 4.76 10*6/uL (ref 3.80–5.10)
RDW: 12.6 % (ref 11.0–15.0)
Total Lymphocyte: 19.1 %
WBC: 6.5 10*3/uL (ref 3.8–10.8)

## 2020-04-03 LAB — COMPLETE METABOLIC PANEL WITH GFR
AG Ratio: 1.4 (calc) (ref 1.0–2.5)
ALT: 16 U/L (ref 6–29)
AST: 15 U/L (ref 10–30)
Albumin: 4 g/dL (ref 3.6–5.1)
Alkaline phosphatase (APISO): 101 U/L (ref 31–125)
BUN: 8 mg/dL (ref 7–25)
CO2: 29 mmol/L (ref 20–32)
Calcium: 9.3 mg/dL (ref 8.6–10.2)
Chloride: 105 mmol/L (ref 98–110)
Creat: 0.69 mg/dL (ref 0.50–1.10)
GFR, Est African American: 141 mL/min/{1.73_m2} (ref >=60)
GFR, Est Non African American: 122 mL/min/{1.73_m2} (ref >=60)
Globulin: 2.8 g/dL (calc) (ref 1.9–3.7)
Glucose, Bld: 86 mg/dL (ref 65–99)
Potassium: 4.5 mmol/L (ref 3.5–5.3)
Sodium: 140 mmol/L (ref 135–146)
Total Bilirubin: 0.3 mg/dL (ref 0.2–1.2)
Total Protein: 6.8 g/dL (ref 6.1–8.1)

## 2020-04-03 LAB — QUANTIFERON-TB GOLD PLUS
Mitogen-NIL: >10 IU/mL
NIL: 0.09 IU/mL
QuantiFERON-TB Gold Plus: NEGATIVE
TB1-NIL: <0 IU/mL
TB2-NIL: 0.01 IU/mL

## 2020-04-04 NOTE — Progress Notes (Signed)
TB Gold is negative.

## 2020-04-09 ENCOUNTER — Other Ambulatory Visit: Payer: Self-pay

## 2020-04-09 ENCOUNTER — Encounter: Payer: Self-pay | Admitting: Obstetrics & Gynecology

## 2020-04-09 ENCOUNTER — Ambulatory Visit: Payer: 59 | Admitting: Obstetrics & Gynecology

## 2020-04-09 VITALS — BP 126/82

## 2020-04-09 DIAGNOSIS — Z308 Encounter for other contraceptive management: Secondary | ICD-10-CM | POA: Diagnosis not present

## 2020-04-09 NOTE — Progress Notes (Signed)
    Toni Livingston [birth year] [id number]        23 y.o.  G0 Engaged  RP: Decreased libido and low mood on BCPS for Counseling on Contraception  HPI: Stopped her BCPs x 2 1/2 weeks because of decreased libido and low mood.  Feels much better with increased energy and drive as well as a much higher libido.  Using condoms which is causing erectile problems to her fiance.     OB History  Gravida Para Term Preterm AB Living  0 0 0 0 0 0  SAB TAB Ectopic Multiple Live Births  0 0 0 0 0    Past medical history,surgical history, problem list, medications, allergies, family history and social history were all reviewed and documented in the EPIC chart.   Directed ROS with pertinent positives and negatives documented in the history of present illness/assessment and plan.  Exam:  Vitals:   04/09/20 1405  BP: 126/82   General appearance:  Normal  Gynecologic exam: Deferred   Assessment/Plan:  24 y.o. G0P0000   1. Encounter for other contraceptive management Decreased libido and low mood on BCPs.  Would probably experience the same symptoms on any hormonal contraceptive blocking ovulation.  Erectile problems with condoms.  Counseling on contraception.  Diaphragm discussed as well as IUD.  Decision to try the Praagard IUD.  Insertion discussed.  Risks/Benefits reviewed.  F/U Paragard IUD insertion.  Princess Bruins MD, 2:21 PM 04/09/2020

## 2020-04-12 ENCOUNTER — Encounter: Payer: Self-pay | Admitting: Obstetrics & Gynecology

## 2020-04-21 ENCOUNTER — Other Ambulatory Visit: Payer: Self-pay

## 2020-04-21 ENCOUNTER — Encounter: Payer: Self-pay | Admitting: Obstetrics & Gynecology

## 2020-04-21 ENCOUNTER — Other Ambulatory Visit: Payer: Self-pay | Admitting: Physician Assistant

## 2020-04-21 ENCOUNTER — Ambulatory Visit (INDEPENDENT_AMBULATORY_CARE_PROVIDER_SITE_OTHER): Payer: 59 | Admitting: Obstetrics & Gynecology

## 2020-04-21 VITALS — BP 134/80

## 2020-04-21 DIAGNOSIS — M458 Ankylosing spondylitis sacral and sacrococcygeal region: Secondary | ICD-10-CM

## 2020-04-21 DIAGNOSIS — Z30430 Encounter for insertion of intrauterine contraceptive device: Secondary | ICD-10-CM | POA: Diagnosis not present

## 2020-04-21 NOTE — Progress Notes (Signed)
    Toni Livingston [birth year] [id number]        23 y.o.  G0  RP: Paraguard IUD insertion  HPI: LMP 04/18/2020, currently menstruating with normal flow.  No pelvic pain.  No abnormal vaginal discharge.  No fever.   OB History  Gravida Para Term Preterm AB Living  0 0 0 0 0 0  SAB TAB Ectopic Multiple Live Births  0 0 0 0 0    Past medical history,surgical history, problem list, medications, allergies, family history and social history were all reviewed and documented in the EPIC chart.   Directed ROS with pertinent positives and negatives documented in the history of present illness/assessment and plan.  Exam:  Vitals:   04/21/20 1148  BP: 134/80   General appearance:  Normal                                                                    IUD procedure note       Patient presented to the office today for placement of Paraguard IUD. The patient had previously been provided with literature information on this method of contraception. The risks benefits and pros and cons were discussed and all her questions were answered. She is fully aware that this form of contraception is 99% effective and is good for 10 years.  Pelvic exam: Vulva normal Vagina: No lesions or discharge Cervix: No lesions or discharge Uterus: AV position, normal volume Adnexa: No masses or tenderness Rectal exam: Not done  The cervix was cleansed with Betadine solution. Hurricane spray on the cervix. Os finder for cervical dilation.  Pretty straight path in the endocervical canal, hysterometry 7 cm.  The IUD was shown to the patient and inserted in a sterile fashion. The IUD string was trimmed. Patient was instructed to return back to the office in one month for follow up.        Assessment/Plan:  24 y.o. G0  1. Encounter for IUD insertion Easy insertion of ParaGard IUD.  No complication.  Well-tolerated by patient.  Postprocedure precautions reviewed.  Follow-up in 4 weeks for IUD  check.  Princess Bruins MD, 12:11 PM 04/21/2020

## 2020-04-21 NOTE — Telephone Encounter (Signed)
Last Visit: 02/20/2020 Next Visit: 05/27/2020 Labs: 04/01/2020 CBC and CMP are normal.  TB Gold: 04/01/2020 Neg   Current Dose per office note on 02/20/2020: Humira 40 mg sq injections every week for 8 weeks and then will resume injections every 14 days.  GU:[id number] spondylitis of sacral region  Okay to refill per Dr. Estanislado Pandy

## 2020-04-23 ENCOUNTER — Telehealth: Payer: Self-pay | Admitting: *Deleted

## 2020-04-23 NOTE — Telephone Encounter (Signed)
-----   Message from Bishop Limbo sent at 04/20/2020 10:20 AM EDT ----- Patient coming in tomorrow for Paraguard insertion with ML.

## 2020-04-24 ENCOUNTER — Encounter: Payer: Self-pay | Admitting: Obstetrics & Gynecology

## 2020-04-28 ENCOUNTER — Encounter: Payer: Self-pay | Admitting: Anesthesiology

## 2020-05-13 NOTE — Progress Notes (Signed)
Office Visit Note  Patient: Toni Livingston             Date of Birth: 1995-10-04           MRN: [id number]             PCP: Ma Hillock, DO Referring: Ma Hillock, DO Visit Date: 05/27/2020 Occupation: _0 @  Subjective:  Medication Management   History of Present Illness: Adrieana Fennelly is a 24 y.o. female with history of ankylosing spondylitis.  She states she has noticed about 80% improvement in her symptoms since she has been on Humira.  She is still continues to have some discomfort in her SI joints.  She had lot of relief from her last cortisone injection in July 2021.  She would like to have bilateral SI joint injections today.  She has not had any recurrence of iritis.  Although she would like to establish with Dr. Manuella Ghazi for the history of iritis.  She denies joint pain or discomfort in any of her other joints.  Activities of Daily Living:  Patient reports morning stiffness for 0 minutes.   Patient Reports nocturnal pain.  Difficulty dressing/grooming: Denies Difficulty climbing stairs: Denies Difficulty getting out of chair: Reports Difficulty using hands for taps, buttons, cutlery, and/or writing: Denies  Review of Systems  Constitutional: Positive for fatigue.  HENT: Negative for mouth sores, mouth dryness and nose dryness.   Eyes: Negative for photophobia, pain, redness, itching, visual disturbance and dryness.  Respiratory: Negative for shortness of breath and difficulty breathing.   Cardiovascular: Negative for chest pain and palpitations.  Gastrointestinal: Negative for blood in stool, constipation and diarrhea.  Endocrine: Negative for increased urination.  Genitourinary: Negative for difficulty urinating.  Musculoskeletal: Positive for arthralgias and joint pain. Negative for joint swelling, myalgias, morning stiffness, muscle tenderness and myalgias.  Skin: Negative for color change, rash and redness.  Allergic/Immunologic: Negative for susceptible to  infections.  Neurological: Positive for headaches. Negative for dizziness, numbness, memory loss and weakness.  Hematological: Positive for bruising/bleeding tendency.  Psychiatric/Behavioral: Negative for confusion.    PMFS History:  Patient Active Problem List   Diagnosis Date Noted  . IUD (intrauterine device) in place-Paraguard 04/2020 05/22/2020  . Closed fracture of fifth metacarpal bone 07/19/2019  . HLA B27 (HLA B27 positive) 04/16/2019  . ANA positive 04/16/2019  . Iritis 04/10/2019  . DDD (degenerative disc disease), lumbar 04/10/2019  . Vaginal high risk HPV DNA test positive 11/19/2018  . Obesity (BMI 30-39.9) 11/14/2018  . Pilonidal cyst 11/14/2018  . LGSIL of cervix of undetermined significance 11/14/2018  . Irregular menses 11/14/2018  . Encounter for well woman exam with routine gynecological exam 09/13/2017  . Oral contraceptive use 07/27/2016  . Encounter for long-term current use of medication 07/27/2016    Past Medical History:  Diagnosis Date  . Ankylosing spondylitis ([place])   . Iritis   . Vertigo     Family History  Problem Relation Age of Onset  . Hypertension Mother   . Testicular cancer Father   . Healthy Sister   . Healthy Brother   . Uterine cancer Maternal Aunt   . Healthy Brother   . Depression Sister   . Anxiety disorder Sister    Past Surgical History:  Procedure Laterality Date  . BREAST REDUCTION SURGERY  2015  . HAND SURGERY Left 07/24/2019   Social History   Social History Narrative   Single. Some college.    Works as Therapist, nutritional.  Drinks caffeine.    Wears seatbelt. Smoke detector in the home.    Exercises routinely.    Feels safe in relationships.       Immunization History  Administered Date(s) Administered  . Influenza,inj,Quad PF,6+ Mos 03/09/2017, 03/05/2018, 02/27/2019  . Influenza-Unspecified 03/20/2016  . PFIZER SARS-COV-2 Vaccination 03/23/2020, 04/13/2020  . PPD Test 07/27/2016  . Tdap 07/27/2016      Objective: Vital Signs: BP 107/74 (BP Location: Left Arm, Patient Position: Sitting, Cuff Size: Normal)   Pulse 82   Resp 15   Ht 5' 8" (1.727 m)   Wt 213 lb 6.4 oz (96.8 kg)   LMP 05/14/2020 Comment: PARAGARD 04/21/20  BMI 32.45 kg/m    Physical Exam Vitals and nursing note reviewed.  Constitutional:      Appearance: She is well-developed.  HENT:     Head: Normocephalic and atraumatic.  Eyes:     Conjunctiva/sclera: Conjunctivae normal.  Cardiovascular:     Rate and Rhythm: Normal rate and regular rhythm.     Heart sounds: Normal heart sounds.  Pulmonary:     Effort: Pulmonary effort is normal.     Breath sounds: Normal breath sounds.  Abdominal:     General: Bowel sounds are normal.     Palpations: Abdomen is soft.  Musculoskeletal:     Cervical back: Normal range of motion.  Lymphadenopathy:     Cervical: No cervical adenopathy.  Skin:    General: Skin is warm and dry.     Capillary Refill: Capillary refill takes less than 2 seconds.  Neurological:     Mental Status: She is alert and oriented to person, place, and time.  Psychiatric:        Behavior: Behavior normal.      Musculoskeletal Exam: C-spine thoracic and lumbar spine with good range of motion.  She had discomfort with forward flexion in the lumbar region.  She had tenderness over bilateral SI joint more prominent on the right side.  Shoulder joints, elbow joints, wrist joints, MCPs PIPs and DIPs with good range of motion.  Hip joints, knee joints, ankles, MTPs and PIPs with good range of motion with no synovitis.  CDAI Exam: CDAI Score: -- Patient Global: --; Provider Global: -- Swollen: --; Tender: -- Joint Exam 05/27/2020   No joint exam has been documented for this visit   There is currently no information documented on the homunculus. Go to the Rheumatology activity and complete the homunculus joint exam.  Investigation: No additional findings.  Imaging: No results found.  Recent  Labs: Lab Results  Component Value Date   WBC 6.5 04/01/2020   HGB 13.7 04/01/2020   PLT 372 04/01/2020   NA 140 04/01/2020   K 4.5 04/01/2020   CL 105 04/01/2020   CO2 29 04/01/2020   GLUCOSE 86 04/01/2020   BUN 8 04/01/2020   CREATININE 0.69 04/01/2020   BILITOT 0.3 04/01/2020   ALKPHOS 109 12/25/2019   AST 15 04/01/2020   ALT 16 04/01/2020   PROT 6.8 04/01/2020   ALBUMIN 4.2 12/25/2019   CALCIUM 9.3 04/01/2020   GFRAA 141 04/01/2020   QFTBGOLDPLUS NEGATIVE 04/01/2020    Speciality Comments: No specialty comments available.  Procedures:  Sacroiliac Joint Inj on 05/27/2020 11:04 AM Indications: pain Details: 27 G 1.5 in needle, posterior approach Medications (Right): 40 mg triamcinolone acetonide 40 MG/ML; 1.5 mL lidocaine 1 % Medications (Left): 40 mg triamcinolone acetonide 40 MG/ML; 1.5 mL lidocaine 1 % Outcome: tolerated well, no immediate complications  Procedure, treatment alternatives, risks and benefits explained, specific risks discussed. Consent was given by the patient. Immediately prior to procedure a time out was called to verify the correct patient, procedure, equipment, support staff and site/side marked as required. Patient was prepped and draped in the usual sterile fashion.     Allergies: Patient has no known allergies.   Assessment / Plan:     Visit Diagnoses: Ankylosing spondylitis of sacral region [facility]) - History of chronic SI joint pain, SI joint sclerosis on the x-rays, HLA-B27 positive.  She has noticed improvement in her symptoms about 80% since she has been on Humira.  She states she works long hours and stands all day.  Towards the end of the day she starts having pain in her SI joints.  She has discomfort in the SI joints off and on.  She requests cortisone injections in her SI joints today.  After informed consent was obtained and side effects were discussed bilateral SI joints were injected with cortisone as described above.  She tolerated the  procedure well.  High risk medication use - Humira 40 mg sq injections every 14 days.  (started on 05/09/19).  Her labs have been stable.  We will get labs in January and then every 3 months to monitor for drug toxicity.  Elevated LFTs -  AST was 34 and ALT was 61 on 10/25/2019.  She was was evaluated by gastroenterologist and no cause was established.  Her LFTs are normal now.  HLA B27 positive  Iritis - followed by ophthalmology.No episides in the last few months.  She would like to establish with Dr. Manuella Ghazi.  I will make the referral.  Chronic SI joint pain-she continues to have some discomfort in her SI joints.  ANA positive-she has no clinical features of lupus or related illness.  Vaginal high risk HPV DNA test positive  Pilonidal cyst-she has not had any recent recurrence.  I advised her to see a surgeon in the future for resection of the cyst to avoid future infections.  Other insomnia-sleep hygiene was discussed.  She is fully vaccinated against COVID-19.  She will need a booster Wednesday.  Use of mask, social distancing and hand hygiene was discussed.  Orders: Orders Placed This Encounter  Procedures  . Sacroiliac Joint Inj  . Ambulatory referral to Ophthalmology   No orders of the defined types were placed in this encounter.   Follow-Up Instructions: Return in about 3 months (around 08/25/2020) for Ankylosing spondylitis.   Bo Merino, MD  Note - This record has been created using Editor, commissioning.  Chart creation errors have been sought, but may not always  have been located. Such creation errors do not reflect on  the standard of medical care.

## 2020-05-21 ENCOUNTER — Ambulatory Visit: Payer: 59 | Admitting: Obstetrics & Gynecology

## 2020-05-21 ENCOUNTER — Encounter: Payer: Self-pay | Admitting: Obstetrics & Gynecology

## 2020-05-21 ENCOUNTER — Other Ambulatory Visit: Payer: Self-pay

## 2020-05-21 VITALS — BP 122/76

## 2020-05-21 DIAGNOSIS — Z30431 Encounter for routine checking of intrauterine contraceptive device: Secondary | ICD-10-CM

## 2020-05-21 NOTE — Progress Notes (Signed)
    Chamari Cutbirth 08/27/1995 [id number]        24 y.o.  G0  RP: ParaGard IUD check 4 wks post insertion  HPI: Well since insertion of ParaGard IUD on April 21, 2020.  No breakthrough bleeding.  No vaginal discharge.  No pelvic pain.  Had a normal menstrual period which just finished now.  No fever.   OB History  Gravida Para Term Preterm AB Living  0 0 0 0 0 0  SAB TAB Ectopic Multiple Live Births  0 0 0 0 0    Past medical history,surgical history, problem list, medications, allergies, family history and social history were all reviewed and documented in the EPIC chart.   Directed ROS with pertinent positives and negatives documented in the history of present illness/assessment and plan.  Exam:  Vitals:   05/21/20 1532  BP: 122/76   General appearance:  Normal  Abdomen: Normal  Gynecologic exam: Vulva normal.  Speculum: Cervix normal with no erythema.  IUD strings visible at the external os.  Normal vaginal secretions.  Vagina normal.   Assessment/Plan:  24 y.o. G0  1. Encounter for routine checking of intrauterine contraceptive device (IUD) ParaGard IUD check 4 weeks post insertion.  Well-tolerated with no complication.  IUD in good location with IUD strings visible at the external os and no sign of infection.  Patient reassured.  Follow-up for annual gynecologic exam October 2022.  Princess Bruins MD, 3:34 PM 05/21/2020

## 2020-05-22 ENCOUNTER — Encounter: Payer: Self-pay | Admitting: Family Medicine

## 2020-05-22 DIAGNOSIS — Z975 Presence of (intrauterine) contraceptive device: Secondary | ICD-10-CM | POA: Insufficient documentation

## 2020-05-27 ENCOUNTER — Other Ambulatory Visit: Payer: Self-pay

## 2020-05-27 ENCOUNTER — Ambulatory Visit: Payer: 59 | Admitting: Rheumatology

## 2020-05-27 ENCOUNTER — Encounter: Payer: Self-pay | Admitting: Rheumatology

## 2020-05-27 VITALS — BP 107/74 | HR 82 | Resp 15 | Ht 68.0 in | Wt 213.4 lb

## 2020-05-27 DIAGNOSIS — R7989 Other specified abnormal findings of blood chemistry: Secondary | ICD-10-CM

## 2020-05-27 DIAGNOSIS — Z3041 Encounter for surveillance of contraceptive pills: Secondary | ICD-10-CM

## 2020-05-27 DIAGNOSIS — R7689 Other specified abnormal immunological findings in serum: Secondary | ICD-10-CM

## 2020-05-27 DIAGNOSIS — R768 Other specified abnormal immunological findings in serum: Secondary | ICD-10-CM

## 2020-05-27 DIAGNOSIS — L0591 Pilonidal cyst without abscess: Secondary | ICD-10-CM

## 2020-05-27 DIAGNOSIS — M458 Ankylosing spondylitis sacral and sacrococcygeal region: Secondary | ICD-10-CM

## 2020-05-27 DIAGNOSIS — M533 Sacrococcygeal disorders, not elsewhere classified: Secondary | ICD-10-CM

## 2020-05-27 DIAGNOSIS — G4709 Other insomnia: Secondary | ICD-10-CM

## 2020-05-27 DIAGNOSIS — Z1589 Genetic susceptibility to other disease: Secondary | ICD-10-CM | POA: Diagnosis not present

## 2020-05-27 DIAGNOSIS — Z79899 Other long term (current) drug therapy: Secondary | ICD-10-CM | POA: Diagnosis not present

## 2020-05-27 DIAGNOSIS — H209 Unspecified iridocyclitis: Secondary | ICD-10-CM

## 2020-05-27 DIAGNOSIS — Z7189 Other specified counseling: Secondary | ICD-10-CM

## 2020-05-27 DIAGNOSIS — G8929 Other chronic pain: Secondary | ICD-10-CM

## 2020-05-27 DIAGNOSIS — R87811 Vaginal high risk human papillomavirus (HPV) DNA test positive: Secondary | ICD-10-CM

## 2020-05-27 MED ORDER — LIDOCAINE HCL 1 % IJ SOLN
1.5000 mL | INTRAMUSCULAR | Status: AC | PRN
  Administered 2020-05-27: 1.5 mL

## 2020-05-27 MED ORDER — TRIAMCINOLONE ACETONIDE 40 MG/ML IJ SUSP
40.0000 mg | INTRAMUSCULAR | Status: AC | PRN
  Administered 2020-05-27: 40 mg via INTRA_ARTICULAR

## 2020-05-27 NOTE — Patient Instructions (Addendum)
Standing Labs We placed an order today for your standing lab work.   Please have your standing labs drawn in January and every 3 months  If possible, please have your labs drawn 2 weeks prior to your appointment so that the provider can discuss your results at your appointment.  We have open lab daily Monday through Thursday from 8:30-12:30 PM and 1:30-4:30 PM and Friday from 8:30-12:30 PM and 1:30-4:00 PM at the office of Dr. Bo Merino, [facility].   Please be advised, patients with office appointments requiring lab work will take precedents over walk-in lab work.  If possible, please come for your lab work on Monday and Friday afternoons, as you may experience shorter wait times. The office is located at [address], [place], [place], [place] [address] No appointment is necessary.   Labs are drawn by [facility]. Please bring your co-pay at the time of your lab draw.  You may receive a bill from [facility] for your lab work.  If you wish to have your labs drawn at another location, please call the office 24 hours in advance to send orders.  If you have any questions regarding directions or hours of operation,  please call [phone].   As a reminder, please drink plenty of water prior to coming for your lab work. Thanks!   Heart Disease Prevention   Your inflammatory disease increases your risk of heart disease which includes heart attack, stroke, atrial fibrillation (irregular heartbeats), high blood pressure, heart failure and atherosclerosis (plaque in the arteries).  It is important to reduce your risk by:   . Keep blood pressure, cholesterol, and blood sugar at healthy levels   . Smoking Cessation   . Maintain a healthy weight  o BMI 20-25   . Eat a healthy diet  o Plenty of fresh fruit, vegetables, and whole grains  o Limit saturated fats, foods high in sodium, and added sugars  o DASH and Mediterranean diet   . Increase physical activity  o Recommend  moderate physically activity for 150 minutes per week/ 30 minutes a day for five days a week These can be broken up into three separate ten-minute sessions during the day.   . Reduce Stress  . Meditation, slow breathing exercises, yoga, coloring books  . Dental visits twice a year

## 2020-06-11 ENCOUNTER — Other Ambulatory Visit: Payer: Self-pay

## 2020-06-11 MED ORDER — PREDNISONE 5 MG PO TABS: ORAL_TABLET | ORAL | 0 refills | Status: DC

## 2020-06-11 NOTE — Telephone Encounter (Signed)
Patient called stating she woke up this morning with "extreme back pain."  Patient states she worked 13 hour shifts for both Monday and Tuesday and now she has shooting pain in her back on the left side which is shooting down her left leg.  Patient is requesting a return call.

## 2020-06-11 NOTE — Telephone Encounter (Signed)
Works as a Personnel officer.  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  She states she is having difficulty walking and doing routine activities.  She requested prednisone taper.  Side effects of prednisone use was discussed.  I will give her a prednisone taper starting at 20 mg and taper by 5 mg every 4 days.

## 2020-06-11 NOTE — Telephone Encounter (Signed)
Patient states she started having severe back pain since Monday 06/08/2020. Patient states she has worked 13 hour shifts on her feet Monday and Tuesday which has aggravated it. Patient is on Humira and has been taking it as prescribed. Patient states she has another full day tomorrow and would like to know what she can do to help with the pain. Please advise.

## 2020-07-03 DIAGNOSIS — Z1152 Encounter for screening for COVID-19: Secondary | ICD-10-CM | POA: Diagnosis not present

## 2020-07-18 ENCOUNTER — Other Ambulatory Visit: Payer: Self-pay | Admitting: Rheumatology

## 2020-07-18 DIAGNOSIS — M458 Ankylosing spondylitis sacral and sacrococcygeal region: Secondary | ICD-10-CM

## 2020-07-19 NOTE — Telephone Encounter (Signed)
Toni Livingston, please call patient and advise labs are due. Thank you.

## 2020-07-19 NOTE — Telephone Encounter (Signed)
Last Visit: 05/27/2020 Next Visit: 08/26/2020 Labs: 04/01/2020, CBC and CMP are normal. Patient due for labs TB Gold: 10/13/021, negative  Current Dose per office note 05/27/2020, Humira 40 mg sq injections every 14 days.  [id number] spondylitis of sacral region  Okay to refill Humira?

## 2020-07-20 NOTE — Telephone Encounter (Signed)
Patient advised she is due to update labs. Patient will come in on 07/22/2020 or 07/23/2020 to have them done.

## 2020-07-23 ENCOUNTER — Other Ambulatory Visit: Payer: Self-pay | Admitting: *Deleted

## 2020-07-23 DIAGNOSIS — Z79899 Other long term (current) drug therapy: Secondary | ICD-10-CM | POA: Diagnosis not present

## 2020-07-24 LAB — CBC WITH DIFFERENTIAL/PLATELET
Absolute Monocytes: 507 cells/uL (ref 200–950)
Basophils Absolute: 33 cells/uL (ref 0–200)
Basophils Relative: 0.5 %
Eosinophils Absolute: 91 cells/uL (ref 15–500)
Eosinophils Relative: 1.4 %
HCT: 39.8 % (ref 35.0–45.0)
Hemoglobin: 13.4 g/dL (ref 11.7–15.5)
Lymphs Abs: 1411 cells/uL (ref 850–3900)
MCH: 27.7 pg (ref 27.0–33.0)
MCHC: 33.7 g/dL (ref 32.0–36.0)
MCV: 82.4 fL (ref 80.0–100.0)
MPV: 10.1 fL (ref 7.5–12.5)
Monocytes Relative: 7.8 %
Neutro Abs: 4459 cells/uL (ref 1500–7800)
Neutrophils Relative %: 68.6 %
Platelets: 401 10*3/uL — ABNORMAL HIGH (ref 140–400)
RBC: 4.83 10*6/uL (ref 3.80–5.10)
RDW: 12.5 % (ref 11.0–15.0)
Total Lymphocyte: 21.7 %
WBC: 6.5 10*3/uL (ref 3.8–10.8)

## 2020-07-24 LAB — COMPLETE METABOLIC PANEL WITH GFR
AG Ratio: 1.4 (calc) (ref 1.0–2.5)
ALT: 17 U/L (ref 6–29)
AST: 21 U/L (ref 10–30)
Albumin: 4.3 g/dL (ref 3.6–5.1)
Alkaline phosphatase (APISO): 99 U/L (ref 31–125)
BUN: 9 mg/dL (ref 7–25)
CO2: 27 mmol/L (ref 20–32)
Calcium: 9.7 mg/dL (ref 8.6–10.2)
Chloride: 102 mmol/L (ref 98–110)
Creat: 0.65 mg/dL (ref 0.50–1.10)
GFR, Est African American: 143 mL/min/{1.73_m2} (ref >=60)
GFR, Est Non African American: 123 mL/min/{1.73_m2} (ref >=60)
Globulin: 3 g/dL (calc) (ref 1.9–3.7)
Glucose, Bld: 73 mg/dL (ref 65–99)
Potassium: 4.1 mmol/L (ref 3.5–5.3)
Sodium: 137 mmol/L (ref 135–146)
Total Bilirubin: 0.5 mg/dL (ref 0.2–1.2)
Total Protein: 7.3 g/dL (ref 6.1–8.1)

## 2020-07-24 NOTE — Progress Notes (Signed)
CBC and CMP are stable.

## 2020-07-28 DIAGNOSIS — H209 Unspecified iridocyclitis: Secondary | ICD-10-CM | POA: Insufficient documentation

## 2020-07-28 DIAGNOSIS — Q141 Congenital malformation of retina: Secondary | ICD-10-CM | POA: Diagnosis not present

## 2020-07-28 DIAGNOSIS — H3581 Retinal edema: Secondary | ICD-10-CM | POA: Insufficient documentation

## 2020-08-06 NOTE — Progress Notes (Signed)
Office Visit Note  Patient: Toni Livingston             Date of Birth: Oct 09, 1995           MRN: [id number]             PCP: Natalia Leatherwood, DO Referring: Natalia Leatherwood, DO Visit Date: 08/07/2020 Occupation: @GUAROCC @  Subjective:  Eye redness.   History of Present Illness: Toni Livingston is a 25 y.o. female with a history of HLA-B27 positive ankylosing spondylitis.  She was evaluated by Dr. Geraldine Solar on July 28, 2020 he recommended adding CellCept for iridocyclitis.  Continues to have frequent flares of iridocyclitis.  Last flare was in January.  She has been off prednisone for almost a month now.  Activities of Daily Living:  Patient reports morning stiffness for 0 minutes.   Patient Reports nocturnal pain.  Difficulty dressing/grooming: Denies Difficulty climbing stairs: Denies Difficulty getting out of chair: Denies Difficulty using hands for taps, buttons, cutlery, and/or writing: Denies  Review of Systems  Constitutional: Negative for fatigue.  HENT: Negative for mouth sores, mouth dryness and nose dryness.   Eyes: Positive for pain. Negative for redness, itching, visual disturbance and dryness.  Respiratory: Negative for cough, shortness of breath, wheezing and difficulty breathing.   Cardiovascular: Negative for chest pain and palpitations.  Gastrointestinal: Positive for constipation. Negative for blood in stool and diarrhea.  Endocrine: Negative for increased urination.  Genitourinary: Negative for difficulty urinating.  Musculoskeletal: Negative for arthralgias, joint pain, joint swelling, myalgias, morning stiffness, muscle tenderness and myalgias.  Skin: Negative for color change, rash, hair loss and redness.  Allergic/Immunologic: Negative for susceptible to infections.  Neurological: Positive for headaches. Negative for dizziness, numbness, memory loss and weakness.  Hematological: Positive for bruising/bleeding tendency.  Psychiatric/Behavioral: Negative  for confusion.    PMFS History:  Patient Active Problem List   Diagnosis Date Noted  . IUD (intrauterine device) in place-Paraguard 04/2020 05/22/2020  . Closed fracture of fifth metacarpal bone 07/19/2019  . HLA B27 (HLA B27 positive) 04/16/2019  . ANA positive 04/16/2019  . Iritis 04/10/2019  . DDD (degenerative disc disease), lumbar 04/10/2019  . Vaginal high risk HPV DNA test positive 11/19/2018  . Obesity (BMI 30-39.9) 11/14/2018  . Pilonidal cyst 11/14/2018  . LGSIL of cervix of undetermined significance 11/14/2018  . Irregular menses 11/14/2018  . Encounter for well woman exam with routine gynecological exam 09/13/2017  . Oral contraceptive use 07/27/2016  . Encounter for long-term current use of medication 07/27/2016    Past Medical History:  Diagnosis Date  . Ankylosing spondylitis ([facility])   . Iritis   . Vertigo     Family History  Problem Relation Age of Onset  . Hypertension Mother   . Testicular cancer Father   . Healthy Sister   . Healthy Brother   . Uterine cancer Maternal Aunt   . Healthy Brother   . Depression Sister   . Anxiety disorder Sister    Past Surgical History:  Procedure Laterality Date  . BREAST REDUCTION SURGERY  2015  . HAND SURGERY Left 07/24/2019   Social History   Social History Narrative   Single. Some college.    Works as Merchant navy officer.    Drinks caffeine.    Wears seatbelt. Smoke detector in the home.    Exercises routinely.    Feels safe in relationships.       Immunization History  Administered Date(s) Administered  . Influenza,inj,Quad PF,6+ Mos 03/09/2017,  03/05/2018, 02/27/2019  . Influenza-Unspecified 03/20/2016  . PFIZER([place])SARS-COV-2 Vaccination 03/23/2020, 04/13/2020  . PPD Test 07/27/2016  . Tdap 07/27/2016     Objective: Vital Signs: BP 123/79 (BP Location: Left Arm, Patient Position: Sitting, Cuff Size: Normal)   Pulse 80   Resp 14   Ht 5\' 8"  (1.727 m)   Wt 218 lb 6.4 oz (99.1 kg)   BMI  33.21 kg/m    Physical Exam Vitals and nursing note reviewed.  Constitutional:      Appearance: She is well-developed and well-nourished.  HENT:     Head: Normocephalic and atraumatic.  Eyes:     Extraocular Movements: EOM normal.     Conjunctiva/sclera: Conjunctivae normal.     Comments: Conjunctival injection was noted in the left eye.  Cardiovascular:     Rate and Rhythm: Normal rate and regular rhythm.     Pulses: Intact distal pulses.     Heart sounds: Normal heart sounds.  Pulmonary:     Effort: Pulmonary effort is normal.     Breath sounds: Normal breath sounds.  Abdominal:     General: Bowel sounds are normal.     Palpations: Abdomen is soft.  Musculoskeletal:     Cervical back: Normal range of motion.  Lymphadenopathy:     Cervical: No cervical adenopathy.  Skin:    General: Skin is warm and dry.     Capillary Refill: Capillary refill takes less than 2 seconds.  Neurological:     Mental Status: She is alert and oriented to person, place, and time.  Psychiatric:        Mood and Affect: Mood and affect normal.        Behavior: Behavior normal.      Musculoskeletal Exam: C-spine thoracic lumbar spine with good range of motion.  She tenderness over SI joints.  Shoulder joints, elbow joints, wrist joints, MCPs PIPs and DIPs with good range of motion with no synovitis.  Hip joints, knee joints, ankles, MTPs and PIPs with good range of motion with no synovitis.  CDAI Exam: CDAI Score: -- Patient Global: --; Provider Global: -- Swollen: --; Tender: -- Joint Exam 08/07/2020   No joint exam has been documented for this visit   There is currently no information documented on the homunculus. Go to the Rheumatology activity and complete the homunculus joint exam.  Investigation: No additional findings.  Imaging: No results found.  Recent Labs: Lab Results  Component Value Date   WBC 6.5 07/23/2020   HGB 13.4 07/23/2020   PLT 401 (H) 07/23/2020   NA 137  07/23/2020   K 4.1 07/23/2020   CL 102 07/23/2020   CO2 27 07/23/2020   GLUCOSE 73 07/23/2020   BUN 9 07/23/2020   CREATININE 0.65 07/23/2020   BILITOT 0.5 07/23/2020   ALKPHOS 109 12/25/2019   AST 21 07/23/2020   ALT 17 07/23/2020   PROT 7.3 07/23/2020   ALBUMIN 4.2 12/25/2019   CALCIUM 9.7 07/23/2020   GFRAA 143 07/23/2020   QFTBGOLDPLUS NEGATIVE 04/01/2020    Speciality Comments: No specialty comments available.  Procedures:  No procedures performed Allergies: Patient has no known allergies.   Assessment / Plan:     Visit Diagnoses: Ankylosing spondylitis of sacral region [facility]) - History of chronic SI joint pain, SI joint sclerosis on the x-rays, HLA-B27 positive. SI joints injected on 05/27/2020.  She has noticed some improvement on Humira.  She still continues to have some discomfort and stiffness in the SI joints.  She was recently seen by Dr. Gae Bon, who diagnosed her with iridocyclitis and recommended adding CellCept.  Indications side effects contraindications of CellCept were discussed at length.  Patient is using IUD currently.  After informed consent was obtained we decided to proceed with CellCept.  Handout was given and consent was taken.  She will be starting CellCept at 500 mg p.o. twice daily.  If labs are stable in 2 weeks and will increase the dose to CellCept 500 mg, 2 tablets p.o. twice daily.  She will get labs 2 weeks after the increased dose and then every 2 months to monitor for drug toxicity.  She was advised to stop CellCept in case she develops infection.  Medication counseling:  TB Gold: April 01, 2020 Hepatitis panel: December 25, 2019 HIV: July 27, 2016  Chest-xray: April 18, 2019  Contraception: IUD  Patient was counseled on the purpose, proper use, and adverse effects of mycophenolate including risk of infection, new or reactivation of viral infections, nausea, and headaches.  Discussed warning of increased risk of development of lymphoma and  other malignancies, particularly of the skin.  Discussed risk of neutropenia and discussed importance of regular labs to monitor blood counts, liver function, and kidney function.  Reviewed risk of congenital malformations, and the importance of contraception while on this medication.   High risk medication use - Humira 40 mg sq injections every 14 days.  Her labs from February 2022 were within normal limits.  She will continue to get labs on a regular basis.  Iridocyclitis-recently diagnosed with Dr. Sherryll Burger.  She has been having recurrent problems.  HLA B27 positive  Chronic SI joint pain-she continues to have some chronic SI joint discomfort.  ANA positive-she has no clinical features of lupus.  Vaginal high risk HPV DNA test positive  Pilonidal cyst  Educated about COVID-19 virus infection-patient had 2 initial vaccines.  Have advised her to get a third dose now before starting CellCept.  A week later she can start CellCept and then we can decide to get a booster 6 months later. Orders: No orders of the defined types were placed in this encounter.  No orders of the defined types were placed in this encounter.     Follow-Up Instructions: Return in about 6 weeks (around 09/18/2020).   Pollyann Savoy, MD  Note - This record has been created using Animal nutritionist.  Chart creation errors have been sought, but may not always  have been located. Such creation errors do not reflect on  the standard of medical care.

## 2020-08-07 ENCOUNTER — Ambulatory Visit (INDEPENDENT_AMBULATORY_CARE_PROVIDER_SITE_OTHER): Payer: 59 | Admitting: Rheumatology

## 2020-08-07 ENCOUNTER — Encounter: Payer: Self-pay | Admitting: Rheumatology

## 2020-08-07 ENCOUNTER — Other Ambulatory Visit: Payer: Self-pay

## 2020-08-07 VITALS — BP 123/79 | HR 80 | Resp 14 | Ht 68.0 in | Wt 218.4 lb

## 2020-08-07 DIAGNOSIS — M458 Ankylosing spondylitis sacral and sacrococcygeal region: Secondary | ICD-10-CM

## 2020-08-07 DIAGNOSIS — G8929 Other chronic pain: Secondary | ICD-10-CM

## 2020-08-07 DIAGNOSIS — R87811 Vaginal high risk human papillomavirus (HPV) DNA test positive: Secondary | ICD-10-CM

## 2020-08-07 DIAGNOSIS — R7689 Other specified abnormal immunological findings in serum: Secondary | ICD-10-CM

## 2020-08-07 DIAGNOSIS — Z1589 Genetic susceptibility to other disease: Secondary | ICD-10-CM

## 2020-08-07 DIAGNOSIS — L0591 Pilonidal cyst without abscess: Secondary | ICD-10-CM

## 2020-08-07 DIAGNOSIS — R768 Other specified abnormal immunological findings in serum: Secondary | ICD-10-CM

## 2020-08-07 DIAGNOSIS — Z79899 Other long term (current) drug therapy: Secondary | ICD-10-CM | POA: Diagnosis not present

## 2020-08-07 DIAGNOSIS — M533 Sacrococcygeal disorders, not elsewhere classified: Secondary | ICD-10-CM

## 2020-08-07 DIAGNOSIS — G4709 Other insomnia: Secondary | ICD-10-CM

## 2020-08-07 DIAGNOSIS — H209 Unspecified iridocyclitis: Secondary | ICD-10-CM | POA: Diagnosis not present

## 2020-08-07 DIAGNOSIS — R7989 Other specified abnormal findings of blood chemistry: Secondary | ICD-10-CM

## 2020-08-07 MED ORDER — MYCOPHENOLATE MOFETIL 500 MG PO TABS: ORAL_TABLET | ORAL | 0 refills | Status: DC

## 2020-08-07 NOTE — Patient Instructions (Addendum)
Mycophenolate tablets What is this medicine? MYCOPHENOLATE MOFETIL (mye koe FEN oh late MOE fe til) is used to decrease the immune system's response to a transplanted organ. This medicine may be used for other purposes; ask your health care provider or pharmacist if you have questions. COMMON BRAND NAME(S): CellCept What should I tell my health care provider before I take this medicine? They need to know if you have any of these conditions:  anemia or other blood disorder  cancer  diarrhea  immune system problems  infection (especially a viral infection such as chickenpox, cold sores, or herpes)  kidney disease  recently received or scheduled to receive a vaccination  stomach problems  an unusual or allergic reaction to mycophenolate mofetil, other medicines, foods, dyes, or preservatives  pregnant or trying to get pregnant  breast-feeding How should I use this medicine? Take this medicine by mouth with a full glass of water. Follow the directions on the prescription label. Take this medicine on an empty stomach, at least 1 hour before or 2 hours after food. Do not take with food unless your doctor approves. Do not cut, crush, or chew the medicine. Swallow the tablets whole. If the medicine is broken or is not intact, do not get the powder on your skin or eyes. If contact occurs, rinse thoroughly with water. Take your medicine at regular intervals. Do not take your medicine more often than directed. Do not stop taking except on your doctor's advice. A special MedGuide will be given to you by the pharmacist with each prescription and refill. Be sure to read this information carefully each time. Talk to your pediatrician regarding the use of this medicine in children. While this drug may be prescribed for selected conditions, precautions do apply. Overdosage: If you think you have taken too much of this medicine contact a poison control center or emergency room at once. NOTE: This  medicine is only for you. Do not share this medicine with others. What if I miss a dose? If you miss a dose, take it as soon as you can. If it is almost time for your next dose, take only that dose. Do not take double or extra doses. What may interact with this medicine? Do not take this medicine with any of the following medications:  live vaccines This medicine may also interact with the following medications:  acyclovir or valacyclovir  azathioprine  birth control pills  certain antibiotics like ciprofloxacin, levofloxacin, norfloxacin, trimethoprim; sulfamethoxazole, penicillin, and amoxicillin; clavulanic acid  certain medicines for stomach problems like lansoprazole, omeprazole, or pantoprazole  cyclosporine  ganciclovir or valganciclovir  isavuconazonium  medicines for cholesterol like cholestyramine and colestipol  metronidazole  other mycophenolate medicines  probenecid  rifampin  sevelamer  stomach acid blockers like magnesium hydroxide and aluminum hydroxide  telmisartan This list may not describe all possible interactions. Give your health care provider a list of all the medicines, herbs, non-prescription drugs, or dietary supplements you use. Also tell them if you smoke, drink alcohol, or use illegal drugs. Some items may interact with your medicine. What should I watch for while using this medicine? Visit your doctor or health care professional for regular checks on your progress. You will need frequent blood checks during the first few months you are receiving the medicine. Keep out of the sun. If you cannot avoid being in the sun, wear protective clothing and use sunscreen. Do not use sun lamps or tanning beds/booths. This medicine can cause birth defects. Do not  get pregnant while taking this drug. Females will need to have a negative pregnancy test before starting this medicine. If sexually active, use 2 reliable forms of birth control together for 4  weeks before starting this medicine, while you are taking this medicine, and for 6 weeks after you stop taking this medicine. Birth control pills alone may not work properly while you are taking this medicine. If you think that you might be pregnant talk to your doctor right away. Males who get this medicine must use a condom during sex with females who can get pregnant. If you get a woman pregnant, the baby could have birth defects. The baby could die before they are born. You will need to continue wearing a condom for 90 days after stopping the medicine. Tell your health care provider right away if your partner becomes pregnant while you are taking this medicine. Do not donate sperm while taking this medicine or for 90 days after stopping it. If you get a cold or other infection while receiving this medicine, call your doctor or health care professional. Do not treat yourself. The medicine may decrease your body's ability to fight infections. Do not give blood while taking this medicine or for 6 weeks after stopping it. You may get drowsy or dizzy. Do not drive, use machinery, or do anything that needs mental alertness until you know how this medicine affects you. Do not stand up or sit up quickly, especially if you are an older patient. This reduces the risk of dizzy or fainting spells. What side effects may I notice from receiving this medicine? Side effects that you should report to your doctor or health care professional as soon as possible:  allergic reactions like skin rash, itching or hives, swelling of the face, lips, or tongue  bloody, dark, or tarry stools  changes in vision  dizziness  fever, chills or any other sign of infection  unusual bleeding or bruising  unusually weak or tired Side effects that usually do not require medical attention (report to your doctor or health care professional if they continue or are bothersome):  constipation  diarrhea  trouble sleeping  loss  of appetite  nausea, vomiting This list may not describe all possible side effects. Call your doctor for medical advice about side effects. You may report side effects to FDA at 1-800-FDA-1088. Where should I keep my medicine? Keep out of the reach of children. Store at room temperature between 15 and 30 degrees C (59 and 86 degrees F). Protect from light. Throw away any unused medicine after the expiration date. NOTE: This sheet is a summary. It may not cover all possible information. If you have questions about this medicine, talk to your doctor, pharmacist, or health care provider.  2021 Elsevier/Gold Standard (2018-10-22 19:54:00)  Standing Labs We placed an order today for your standing lab work.   Please have your standing labs drawn in 2 weeks x 2 after starting CellCept then every 2 months  If possible, please have your labs drawn 2 weeks prior to your appointment so that the provider can discuss your results at your appointment.  We have open lab daily Monday through Thursday from 1:30-4:30 PM and Friday from 1:30-4:00 PM at the office of Dr. Bo Merino, [facility].   Please be advised, all patients with office appointments requiring lab work will take precedents over walk-in lab work.  If possible, please come for your lab work on Monday and Friday afternoons, as you  may experience shorter wait times. The office is located at [address] [place], [place], [place] [address] No appointment is necessary.   Labs are drawn by [facility]. Please bring your co-pay at the time of your lab draw.  You may receive a bill from [facility] for your lab work.  If you wish to have your labs drawn at another location, please call the office 24 hours in advance to send orders.  If you have any questions regarding directions or hours of operation,  please call [phone].   As a reminder, please drink plenty of water prior to coming for your lab work. Thanks!  COVID-19  vaccine recommendations:   COVID-19 vaccine is recommended for everyone (unless you are allergic to a vaccine component), even if you are on a medication that suppresses your immune system.   If you are on Methotrexate, Cellcept (mycophenolate), Rinvoq, Morrie Sheldon, and Olumiant- hold the medication for 1 week after each vaccine. Hold Methotrexate for 2 weeks after the single dose COVID-19 vaccine.   It is recommended that individuals on immunosuppressive therapy should get COVID-19 vaccine first 3 doses 1 month apart and then the fourth dose (booster) 6 months after the third dose .  Do not take Tylenol or any anti-inflammatory medications (NSAIDs) 24 hours prior to the COVID-19 vaccination.   There is no direct evidence about the efficacy of the COVID-19 vaccine in individuals who are on medications that suppress the immune system.   Even if you are fully vaccinated, and you are on any medications that suppress your immune system, please continue to wear a mask, maintain at least six feet social distance and practice hand hygiene.   If you develop a COVID-19 infection, please contact your PCP or our office to determine if you need monoclonal antibody infusion.  The booster vaccine is now available for immunocompromised patients.   Please see the following web sites for updated information.   [email or web address]   Take CellCept 500 mg, 1 tablet twice a day for 2 weeks  if lab results are normal then increase CellCept 500 mg 2 tablets by mouth twice a day.  Please come in for labs 2 weeks after increased dose.

## 2020-08-26 ENCOUNTER — Ambulatory Visit: Payer: 59 | Admitting: Physician Assistant

## 2020-09-04 NOTE — Progress Notes (Deleted)
Office Visit Note  Patient: Toni Livingston             Date of Birth: 12/28/1995           MRN: [id number]             PCP: Ma Hillock, DO Referring: Ma Hillock, DO Visit Date: 09/16/2020 Occupation: @GUAROCC @  Subjective:  No chief complaint on file.   History of Present Illness: Toni Livingston is a 25 y.o. female ***   Activities of Daily Living:  Patient reports morning stiffness for *** {minute/hour:19697}.   Patient {ACTIONS;DENIES/REPORTS:21021675::"Denies"} nocturnal pain.  Difficulty dressing/grooming: {ACTIONS;DENIES/REPORTS:21021675::"Denies"} Difficulty climbing stairs: {ACTIONS;DENIES/REPORTS:21021675::"Denies"} Difficulty getting out of chair: {ACTIONS;DENIES/REPORTS:21021675::"Denies"} Difficulty using hands for taps, buttons, cutlery, and/or writing: {ACTIONS;DENIES/REPORTS:21021675::"Denies"}  No Rheumatology ROS completed.   PMFS History:  Patient Active Problem List   Diagnosis Date Noted  . IUD (intrauterine device) in place-[place] 04/2020 05/22/2020  . Closed fracture of fifth metacarpal bone 07/19/2019  . HLA B27 (HLA B27 positive) 04/16/2019  . ANA positive 04/16/2019  . Iritis 04/10/2019  . DDD (degenerative disc disease), lumbar 04/10/2019  . Vaginal high risk HPV DNA test positive 11/19/2018  . Obesity (BMI 30-39.9) 11/14/2018  . Pilonidal cyst 11/14/2018  . LGSIL of cervix of undetermined significance 11/14/2018  . Irregular menses 11/14/2018  . Encounter for well woman exam with routine gynecological exam 09/13/2017  . Oral contraceptive use 07/27/2016  . Encounter for long-term current use of medication 07/27/2016    Past Medical History:  Diagnosis Date  . Ankylosing spondylitis ([place])   . Iritis   . Vertigo     Family History  Problem Relation Age of Onset  . Hypertension Mother   . Testicular cancer Father   . Healthy Sister   . Healthy Brother   . Uterine cancer Maternal Aunt   . Healthy Brother   . Depression  Sister   . Anxiety disorder Sister    Past Surgical History:  Procedure Laterality Date  . BREAST REDUCTION SURGERY  2015  . HAND SURGERY Left 07/24/2019   Social History   Social History Narrative   Single. Some college.    Works as Therapist, nutritional.    Drinks caffeine.    Wears seatbelt. Smoke detector in the home.    Exercises routinely.    Feels safe in relationships.       Immunization History  Administered Date(s) Administered  . Influenza,inj,Quad PF,6+ Mos 03/09/2017, 03/05/2018, 02/27/2019  . Influenza-Unspecified 03/20/2016  . PFIZER(Purple Top)SARS-COV-2 Vaccination 03/23/2020, 04/13/2020  . PPD Test 07/27/2016  . Tdap 07/27/2016     Objective: Vital Signs: There were no vitals taken for this visit.   Physical Exam   Musculoskeletal Exam: ***  CDAI Exam: CDAI Score: - Patient Global: -; Provider Global: - Swollen: -; Tender: - Joint Exam 09/16/2020   No joint exam has been documented for this visit   There is currently no information documented on the homunculus. Go to the Rheumatology activity and complete the homunculus joint exam.  Investigation: No additional findings.  Imaging: No results found.  Recent Labs: Lab Results  Component Value Date   WBC 6.5 07/23/2020   HGB 13.4 07/23/2020   PLT 401 (H) 07/23/2020   NA 137 07/23/2020   K 4.1 07/23/2020   CL 102 07/23/2020   CO2 27 07/23/2020   GLUCOSE 73 07/23/2020   BUN 9 07/23/2020   CREATININE 0.65 07/23/2020   BILITOT 0.5 07/23/2020   ALKPHOS 109 12/25/2019  AST 21 07/23/2020   ALT 17 07/23/2020   PROT 7.3 07/23/2020   ALBUMIN 4.2 12/25/2019   CALCIUM 9.7 07/23/2020   GFRAA 143 07/23/2020   QFTBGOLDPLUS NEGATIVE 04/01/2020    Speciality Comments: No specialty comments available.  Procedures:  No procedures performed Allergies: Patient has no known allergies.   Assessment / Plan:     Visit Diagnoses: No diagnosis found.  Orders: No orders of the defined types were  placed in this encounter.  No orders of the defined types were placed in this encounter.   Face-to-face time spent with patient was *** minutes. Greater than 50% of time was spent in counseling and coordination of care.  Follow-Up Instructions: No follow-ups on file.   Earnestine Mealing, CMA  Note - This record has been created using Editor, commissioning.  Chart creation errors have been sought, but may not always  have been located. Such creation errors do not reflect on  the standard of medical care.

## 2020-09-13 ENCOUNTER — Other Ambulatory Visit: Payer: Self-pay | Admitting: Rheumatology

## 2020-09-13 NOTE — Telephone Encounter (Signed)
Next Visit: 09/06/2020  Last Visit: 08/07/2020  Last Fill: 07/21/2020  DX:  Ankylosing spondylitis of sacral region   Current Dose per office note 08/07/2020,  If labs are stable in 2 weeks and will increase the dose to CellCept 500 mg, 2 tablets p.o. twice daily.  She will get labs 2 weeks after the increased dose and then every 2 months to monitor for drug toxicity. Labs: 07/23/2020, CBC and CMP are stable.  [facility] labs are due.  Okay to refill Cellcept?

## 2020-09-14 NOTE — Telephone Encounter (Signed)
She needs to come in for labs today.  We cannot refill CellCept without the lab results.

## 2020-09-14 NOTE — Telephone Encounter (Signed)
I called patient, patient just started Cellcept (she delayed the medication), patient does not need a RF at this time, patient verbalized understanding to have labs drawn in 2 weeks

## 2020-09-16 ENCOUNTER — Ambulatory Visit: Payer: 59 | Admitting: Rheumatology

## 2020-09-16 DIAGNOSIS — Z79899 Other long term (current) drug therapy: Secondary | ICD-10-CM

## 2020-09-16 DIAGNOSIS — M458 Ankylosing spondylitis sacral and sacrococcygeal region: Secondary | ICD-10-CM

## 2020-09-16 DIAGNOSIS — R768 Other specified abnormal immunological findings in serum: Secondary | ICD-10-CM

## 2020-09-16 DIAGNOSIS — G8929 Other chronic pain: Secondary | ICD-10-CM

## 2020-09-16 DIAGNOSIS — H209 Unspecified iridocyclitis: Secondary | ICD-10-CM

## 2020-09-16 DIAGNOSIS — L0591 Pilonidal cyst without abscess: Secondary | ICD-10-CM

## 2020-09-16 DIAGNOSIS — Z1589 Genetic susceptibility to other disease: Secondary | ICD-10-CM

## 2020-09-16 DIAGNOSIS — R87811 Vaginal high risk human papillomavirus (HPV) DNA test positive: Secondary | ICD-10-CM

## 2020-09-23 NOTE — Progress Notes (Signed)
Office Visit Note  Patient: Toni Livingston             Date of Birth: 18-May-1996           MRN: [id number]             PCP: Ma Hillock, DO Referring: Ma Hillock, DO Visit Date: 10/05/2020 Occupation: @GUAROCC @  Subjective:  Medication monitoring   History of Present Illness: Toni Livingston is a 25 y.o. female with history of ankylosing spondylitis.  She is on humira 40 mg sq injections every 14 days and cellcept 500 mg 1 tablet by mouth twice daily.  She started on cellcept about 3 weeks ago and is tolerating it without any side effects.  She denies any flares in her eyes for the past several weeks and has not needed to use durezol drops. She denies any eye pain at this time.  She has had some mild redness and itching in her eyes which she attributes to seasonal allergies.  She continues to see Dr. Manuella Ghazi on a regular basis.  She denies any increased joint pain or joint swelling recently.  She has occasional lower back discomfort but denies any nocturnal pain or morning stiffness lately.  She denies any plantar fasciitis or achilles tendonitis.   She denies any recent infections.    Activities of Daily Living:  Patient reports morning stiffness for 0 minutes.   Patient Denies nocturnal pain.  Difficulty dressing/grooming: Denies Difficulty climbing stairs: Denies Difficulty getting out of chair: Denies Difficulty using hands for taps, buttons, cutlery, and/or writing: Denies  Review of Systems  Constitutional: Positive for fatigue.  HENT: Negative for mouth sores, mouth dryness and nose dryness.   Eyes: Positive for redness and itching. Negative for pain, visual disturbance and dryness.  Respiratory: Negative for cough, hemoptysis, shortness of breath and difficulty breathing.   Cardiovascular: Negative for chest pain, palpitations, hypertension and swelling in legs/feet.  Gastrointestinal: Negative for blood in stool, constipation and diarrhea.  Endocrine: Negative for  increased urination.  Genitourinary: Negative for painful urination.  Musculoskeletal: Positive for arthralgias and joint pain. Negative for joint swelling, myalgias, muscle weakness, morning stiffness, muscle tenderness and myalgias.  Skin: Negative for color change, pallor, rash, hair loss, nodules/bumps, skin tightness, ulcers and sensitivity to sunlight.  Allergic/Immunologic: Negative for susceptible to infections.  Neurological: Positive for headaches. Negative for dizziness, numbness and weakness.  Hematological: Negative for swollen glands.  Psychiatric/Behavioral: Negative for depressed mood and sleep disturbance. The patient is not nervous/anxious.     PMFS History:  Patient Active Problem List   Diagnosis Date Noted  . IUD (intrauterine device) in place-Paraguard 04/2020 05/22/2020  . Closed fracture of fifth metacarpal bone 07/19/2019  . HLA B27 (HLA B27 positive) 04/16/2019  . ANA positive 04/16/2019  . Iritis 04/10/2019  . DDD (degenerative disc disease), lumbar 04/10/2019  . Vaginal high risk HPV DNA test positive 11/19/2018  . Obesity (BMI 30-39.9) 11/14/2018  . Pilonidal cyst 11/14/2018  . LGSIL of cervix of undetermined significance 11/14/2018  . Irregular menses 11/14/2018  . Encounter for well woman exam with routine gynecological exam 09/13/2017  . Oral contraceptive use 07/27/2016  . Encounter for long-term current use of medication 07/27/2016    Past Medical History:  Diagnosis Date  . Ankylosing spondylitis ([place])   . Iritis   . Vertigo     Family History  Problem Relation Age of Onset  . Hypertension Mother   . Testicular cancer Father   .  Healthy Sister   . Healthy Brother   . Uterine cancer Maternal Aunt   . Healthy Brother   . Depression Sister   . Anxiety disorder Sister    Past Surgical History:  Procedure Laterality Date  . BREAST REDUCTION SURGERY  2015  . HAND SURGERY Left 07/24/2019   Social History   Social History Narrative    Single. Some college.    Works as Therapist, nutritional.    Drinks caffeine.    Wears seatbelt. Smoke detector in the home.    Exercises routinely.    Feels safe in relationships.       Immunization History  Administered Date(s) Administered  . Influenza,inj,Quad PF,6+ Mos 03/09/2017, 03/05/2018, 02/27/2019  . Influenza-Unspecified 03/20/2016  . PFIZER(Purple Top)SARS-COV-2 Vaccination 03/23/2020, 04/13/2020, 08/07/2020  . PPD Test 07/27/2016  . Tdap 07/27/2016     Objective: Vital Signs: BP 117/70 (BP Location: Left Arm, Patient Position: Sitting, Cuff Size: Normal)   Pulse 70   Ht 5' 8"  (1.727 m)   Wt 218 lb 9.6 oz (99.2 kg)   BMI 33.24 kg/m    Physical Exam Vitals and nursing note reviewed.  Constitutional:      Appearance: She is well-developed.  HENT:     Head: Normocephalic and atraumatic.  Eyes:     Conjunctiva/sclera: Conjunctivae normal.  Pulmonary:     Effort: Pulmonary effort is normal.  Abdominal:     Palpations: Abdomen is soft.  Musculoskeletal:     Cervical back: Normal range of motion.  Skin:    General: Skin is warm and dry.     Capillary Refill: Capillary refill takes less than 2 seconds.  Neurological:     Mental Status: She is alert and oriented to person, place, and time.  Psychiatric:        Behavior: Behavior normal.      Musculoskeletal Exam: C-spine, thoracic spine, and lumbar spine good ROM. No SI joint tenderness.  No midline spinal tenderness. Shoulder joints, elbow joints, wrist joints, MCPs, PIPs, and DIPs good ROM with no synovitis.  Complete fist formation bilaterally.  Hip joints, knee joints, and ankle joints good ROM with no discomfort.  No warmth or effusion of knee joints.  No tenderness or swelling of ankle joints.  No evidence of achilles tendonitis or plantar fasciitis.   CDAI Exam: CDAI Score: -- Patient Global: --; Provider Global: -- Swollen: --; Tender: -- Joint Exam 10/05/2020   No joint exam has been documented for  this visit   There is currently no information documented on the homunculus. Go to the Rheumatology activity and complete the homunculus joint exam.  Investigation: No additional findings.  Imaging: No results found.  Recent Labs: Lab Results  Component Value Date   WBC 6.5 07/23/2020   HGB 13.4 07/23/2020   PLT 401 (H) 07/23/2020   NA 137 07/23/2020   K 4.1 07/23/2020   CL 102 07/23/2020   CO2 27 07/23/2020   GLUCOSE 73 07/23/2020   BUN 9 07/23/2020   CREATININE 0.65 07/23/2020   BILITOT 0.5 07/23/2020   ALKPHOS 109 12/25/2019   AST 21 07/23/2020   ALT 17 07/23/2020   PROT 7.3 07/23/2020   ALBUMIN 4.2 12/25/2019   CALCIUM 9.7 07/23/2020   GFRAA 143 07/23/2020   QFTBGOLDPLUS NEGATIVE 04/01/2020    Speciality Comments: No specialty comments available.  Procedures:  No procedures performed Allergies: Patient has no known allergies.   Assessment / Plan:     Visit Diagnoses: Ankylosing spondylitis of  sacral region [facility]): History of chronic SI joint pain, SI joint sclerosis on the x-rays, HLA-B27 positive. SI joints injected on 05/27/2020: She has not had any signs or symptoms of a flare recently.  She has good ROM of the C-spine, thoracic spine, and lumbar spine good ROM with no discomfort.  No midline spinal tenderness or SI joint tenderness.  No evidence of achilles tendonitis or plantar fasciitis.  She has not had any nocturnal pain or morning stiffness. She was previously diagnosed with iridocyclitis by Dr. Manuella Ghazi despite being on Humira. Dr. Manuella Ghazi recommended adding on cellcept in combination with continuing on Humira.  She was started on Cellcept 500 mg 1 tablet by mouth BID about 3 weeks ago and has been tolerating it without any side effects.  She is due to update lab work and if labs are stable she will increase to 500 mg 2 tablets by mouth twice daily.  She will repeat lab work today, 2 weeks, then every 2 months. She will follow up in 2-3 months.   High risk medication  use - Humira 40 mg sq injections every 14 days and Cellcept 500 mg 1 tablet by mouth twice daily.  If labs are stable she will increase to 500 mg 2 tablets twice daily.  She will then require lab work in 2 weeks then every 2 months according to Dr. Estanislado Pandy.  TB gold negative on 04/01/21.  Contraception: IUD.  She has not had any recent infections.  She has received 3 pfizer covid-19 vaccine doses. Discussed the importance of holding humira and cellcept if she develops signs or symptoms of an infection and to resume once the infection has completely cleared.   Discussed the importance of yearly skin exams while on humira.   Iridocyclitis - Diagnosed with Dr. Manuella Ghazi. She was continuing to have recurrent flares despite starting on Humira.  Dr. Manuella Ghazi had recommended adding on cellcept as combination therapy.  Consent was obtained on 08/07/20.  She started on cellcept 500 mg 1 tablet by mouth twice daily about 3 weeks ago.  She is tolerating cellcept without any side effects.  She will be increasing the dose to 500 mg 2 tablets BID pending lab results.  She will remain on Humira as prescribed.  She is not experiencing any eye pain, conjunctival injection, or photophobia at this time.  She has not needed to use durezol drops since starting on cellcept.  She will continue to follow up with Dr. Manuella Ghazi closely.  HLA B27 positive  Chronic SI joint pain: She has no SI joint tenderness or discomfort at this time.  No nocturnal pain.   ANA positive: She has no clinical features of systemic lupus.   Other medical conditions are listed as follows:   Vaginal high risk HPV DNA test positive  Pilonidal cyst  Orders: No orders of the defined types were placed in this encounter.  No orders of the defined types were placed in this encounter.    Follow-Up Instructions: Return in about 5 months (around 03/07/2021) for Ankylosing Spondylitis.   Ofilia Neas, PA-C  Note - This record has been created using  Dragon software.  Chart creation errors have been sought, but may not always  have been located. Such creation errors do not reflect on  the standard of medical care.

## 2020-10-05 ENCOUNTER — Other Ambulatory Visit: Payer: Self-pay

## 2020-10-05 ENCOUNTER — Ambulatory Visit (INDEPENDENT_AMBULATORY_CARE_PROVIDER_SITE_OTHER): Payer: Federal, State, Local not specified - PPO | Admitting: Physician Assistant

## 2020-10-05 ENCOUNTER — Encounter: Payer: Self-pay | Admitting: Physician Assistant

## 2020-10-05 VITALS — BP 117/70 | HR 70 | Ht 68.0 in | Wt 218.6 lb

## 2020-10-05 DIAGNOSIS — R768 Other specified abnormal immunological findings in serum: Secondary | ICD-10-CM

## 2020-10-05 DIAGNOSIS — Z1589 Genetic susceptibility to other disease: Secondary | ICD-10-CM | POA: Diagnosis not present

## 2020-10-05 DIAGNOSIS — R87811 Vaginal high risk human papillomavirus (HPV) DNA test positive: Secondary | ICD-10-CM

## 2020-10-05 DIAGNOSIS — M458 Ankylosing spondylitis sacral and sacrococcygeal region: Secondary | ICD-10-CM | POA: Diagnosis not present

## 2020-10-05 DIAGNOSIS — Z79899 Other long term (current) drug therapy: Secondary | ICD-10-CM

## 2020-10-05 DIAGNOSIS — H209 Unspecified iridocyclitis: Secondary | ICD-10-CM | POA: Diagnosis not present

## 2020-10-05 DIAGNOSIS — R7689 Other specified abnormal immunological findings in serum: Secondary | ICD-10-CM

## 2020-10-05 DIAGNOSIS — G8929 Other chronic pain: Secondary | ICD-10-CM

## 2020-10-05 DIAGNOSIS — L0591 Pilonidal cyst without abscess: Secondary | ICD-10-CM

## 2020-10-05 DIAGNOSIS — M533 Sacrococcygeal disorders, not elsewhere classified: Secondary | ICD-10-CM

## 2020-10-05 NOTE — Telephone Encounter (Signed)
Patient will increase cellcept to 500mg  2 tablets BID pending lab results, per Hazel Sams, PA-C. Thanks!

## 2020-10-06 ENCOUNTER — Other Ambulatory Visit: Payer: Self-pay | Admitting: *Deleted

## 2020-10-06 DIAGNOSIS — Z79899 Other long term (current) drug therapy: Secondary | ICD-10-CM | POA: Diagnosis not present

## 2020-10-06 NOTE — Telephone Encounter (Signed)
LMOM for patient to call and schedule follow-up in 2-3 months

## 2020-10-06 NOTE — Telephone Encounter (Signed)
-----   Message from [place] sent at 10/05/2020  3:15 PM EDT ----- Please call patient and schedule follow up in 2-3 months. Patient will be increase medication dosage after lab results.  Thanks!!

## 2020-10-07 LAB — COMPLETE METABOLIC PANEL WITH GFR
AG Ratio: 1.5 (calc) (ref 1.0–2.5)
ALT: 18 U/L (ref 6–29)
AST: 18 U/L (ref 10–30)
Albumin: 4.4 g/dL (ref 3.6–5.1)
Alkaline phosphatase (APISO): 91 U/L (ref 31–125)
BUN: 9 mg/dL (ref 7–25)
CO2: 26 mmol/L (ref 20–32)
Calcium: 9.6 mg/dL (ref 8.6–10.2)
Chloride: 103 mmol/L (ref 98–110)
Creat: 0.68 mg/dL (ref 0.50–1.10)
GFR, Est African American: 141 mL/min/{1.73_m2} (ref >=60)
GFR, Est Non African American: 122 mL/min/{1.73_m2} (ref >=60)
Globulin: 2.9 g/dL (calc) (ref 1.9–3.7)
Glucose, Bld: 97 mg/dL (ref 65–99)
Potassium: 4.1 mmol/L (ref 3.5–5.3)
Sodium: 138 mmol/L (ref 135–146)
Total Bilirubin: 0.4 mg/dL (ref 0.2–1.2)
Total Protein: 7.3 g/dL (ref 6.1–8.1)

## 2020-10-07 LAB — CBC WITH DIFFERENTIAL/PLATELET
Absolute Monocytes: 481 cells/uL (ref 200–950)
Basophils Absolute: 49 cells/uL (ref 0–200)
Basophils Relative: 0.9 %
Eosinophils Absolute: 178 cells/uL (ref 15–500)
Eosinophils Relative: 3.3 %
HCT: 41.7 % (ref 35.0–45.0)
Hemoglobin: 13.8 g/dL (ref 11.7–15.5)
Lymphs Abs: 1588 cells/uL (ref 850–3900)
MCH: 27.5 pg (ref 27.0–33.0)
MCHC: 33.1 g/dL (ref 32.0–36.0)
MCV: 83.1 fL (ref 80.0–100.0)
MPV: 10.4 fL (ref 7.5–12.5)
Monocytes Relative: 8.9 %
Neutro Abs: 3105 cells/uL (ref 1500–7800)
Neutrophils Relative %: 57.5 %
Platelets: 434 10*3/uL — ABNORMAL HIGH (ref 140–400)
RBC: 5.02 10*6/uL (ref 3.80–5.10)
RDW: 13 % (ref 11.0–15.0)
Total Lymphocyte: 29.4 %
WBC: 5.4 10*3/uL (ref 3.8–10.8)

## 2020-10-07 MED ORDER — MYCOPHENOLATE MOFETIL 500 MG PO TABS: 1000.0000 mg | ORAL_TABLET | Freq: Two times a day (BID) | ORAL | 0 refills | Status: DC

## 2020-10-07 NOTE — Telephone Encounter (Signed)
Labs: 10/06/2020 CMP WNL. Platelet count is slightly elevated but stable. Rest of CBC WNL.  I attempted to contact patient and left message on machine to advise of lab results, advised the patient to call and schedule a follow up for 2-3 months and advised the patient we are sending a refill of cellcept for the increased dose.    Please review prescription and send to the pharmacy.

## 2020-11-10 ENCOUNTER — Other Ambulatory Visit: Payer: Self-pay

## 2020-11-10 NOTE — Telephone Encounter (Signed)
Ok to send in a prednisone taper starting at 20 mg tapering by 5 mg every 2 days.

## 2020-11-10 NOTE — Telephone Encounter (Signed)
Patient called stating she was on vacation at [place] last week and did a lot of walking and is now experiencing sciatic nerve pain on her left side.  Patient requested a return call (after 9:30 am) to let her know if there is anything she can do to help relieve the pain.

## 2020-11-11 ENCOUNTER — Other Ambulatory Visit: Payer: Self-pay | Admitting: Rheumatology

## 2020-11-11 DIAGNOSIS — M458 Ankylosing spondylitis sacral and sacrococcygeal region: Secondary | ICD-10-CM

## 2020-11-11 MED ORDER — PREDNISONE 5 MG PO TABS: ORAL_TABLET | ORAL | 0 refills | Status: DC

## 2020-11-11 NOTE — Addendum Note (Signed)
Addended by: Shona Needles on: 11/11/2020 10:36 AM   Modules accepted: Orders

## 2020-11-11 NOTE — Telephone Encounter (Signed)
I called patient 

## 2020-11-11 NOTE — Telephone Encounter (Signed)
Next Visit: 03/09/2021  Last Visit: 10/05/2020  Last Fill:  07/20/2020  DX: Ankylosing spondylitis of sacral region   Current Dose per office note 10/05/2020, Humira 40 mg sq injections every 14 days   Labs: 10/06/2020, CMP WNL. Platelet count is slightly elevated but stable. Rest of CBC WNL.  TB Gold: 04/01/2020, negative  Okay to refill Humira?

## 2020-12-01 DIAGNOSIS — Q141 Congenital malformation of retina: Secondary | ICD-10-CM | POA: Diagnosis not present

## 2020-12-01 DIAGNOSIS — H209 Unspecified iridocyclitis: Secondary | ICD-10-CM | POA: Diagnosis not present

## 2020-12-01 DIAGNOSIS — H3581 Retinal edema: Secondary | ICD-10-CM | POA: Diagnosis not present

## 2020-12-03 DIAGNOSIS — M545 Low back pain, unspecified: Secondary | ICD-10-CM | POA: Diagnosis not present

## 2021-01-12 ENCOUNTER — Other Ambulatory Visit: Payer: Self-pay | Admitting: *Deleted

## 2021-01-12 DIAGNOSIS — M458 Ankylosing spondylitis sacral and sacrococcygeal region: Secondary | ICD-10-CM

## 2021-01-12 MED ORDER — HUMIRA (2 PEN) 40 MG/0.4ML ~~LOC~~ AJKT: AUTO-INJECTOR | SUBCUTANEOUS | 2 refills | Status: DC

## 2021-01-12 NOTE — Telephone Encounter (Signed)
Next Visit: 03/09/2021  Last Visit: 10/05/2020  Last Fill: 11/11/2020  DX:Ankylosing spondylitis of sacral region  Current Dose per office note 10/05/2020: Humira 40 mg sq injections every 14 days  Labs: 10/06/2020, CMP WNL. Platelet count is slightly elevated but stable.  Rest of CBC WNL.  TB Gold: 04/01/2020, negative   Okay to refill Humira?

## 2021-02-01 ENCOUNTER — Other Ambulatory Visit: Payer: Self-pay

## 2021-02-01 ENCOUNTER — Encounter: Payer: Self-pay | Admitting: Obstetrics & Gynecology

## 2021-02-01 ENCOUNTER — Other Ambulatory Visit (HOSPITAL_COMMUNITY)
Admission: RE | Admit: 2021-02-01 | Discharge: 2021-02-01 | Disposition: A | Discharge status: Home / Self Care | Payer: Federal, State, Local not specified - PPO | Source: Ambulatory Visit | Attending: Obstetrics & Gynecology | Admitting: Obstetrics & Gynecology

## 2021-02-01 ENCOUNTER — Ambulatory Visit (INDEPENDENT_AMBULATORY_CARE_PROVIDER_SITE_OTHER): Payer: Federal, State, Local not specified - PPO | Admitting: Obstetrics & Gynecology

## 2021-02-01 VITALS — BP 104/68 | HR 68 | Resp 20 | Ht 68.5 in | Wt 223.6 lb

## 2021-02-01 DIAGNOSIS — N870 Mild cervical dysplasia: Secondary | ICD-10-CM | POA: Diagnosis not present

## 2021-02-01 DIAGNOSIS — Z30431 Encounter for routine checking of intrauterine contraceptive device: Secondary | ICD-10-CM | POA: Diagnosis not present

## 2021-02-01 DIAGNOSIS — E6609 Other obesity due to excess calories: Secondary | ICD-10-CM | POA: Diagnosis not present

## 2021-02-01 DIAGNOSIS — Z01419 Encounter for gynecological examination (general) (routine) without abnormal findings: Secondary | ICD-10-CM | POA: Diagnosis not present

## 2021-02-01 DIAGNOSIS — Z6833 Body mass index (BMI) 33.0-33.9, adult: Secondary | ICD-10-CM

## 2021-02-01 NOTE — Progress Notes (Signed)
Toni Livingston [birth year] [id number]   History:    25 y.o.   G0 Married x 1 year   RP: Established patient presenting for Annual Gyn exam   HPI: ParaGard IUD x 04/2020.  No BTB.  No pelvic pain.  CIN 1 on [place] 11/2018.  HPV HR pos.  Pap 01/2020 ASCUS/HPV HR Neg.  Gonorrhea and Chlamydia were negative on Nov 14, 2018.  Urine/BMs normal.  Breasts s/p bilateral reduction.  BMI 33.5.  Hiking.      Past medical history,surgical history, family history and social history were all reviewed and documented in the EPIC chart.  Gynecologic History Patient's last menstrual period was 12/27/2020 (approximate).  Obstetric History OB History  Gravida Para Term Preterm AB Living  0 0 0 0 0 0  SAB IAB Ectopic Multiple Live Births  0 0 0 0 0     ROS: A ROS was performed and pertinent positives and negatives are included in the history.  GENERAL: No fevers or chills. HEENT: No change in vision, no earache, sore throat or sinus congestion. NECK: No pain or stiffness. CARDIOVASCULAR: No chest pain or pressure. No palpitations. PULMONARY: No shortness of breath, cough or wheeze. GASTROINTESTINAL: No abdominal pain, nausea, vomiting or diarrhea, melena or bright red blood per rectum. GENITOURINARY: No urinary frequency, urgency, hesitancy or dysuria. MUSCULOSKELETAL: No joint or muscle pain, no back pain, no recent trauma. DERMATOLOGIC: No rash, no itching, no lesions. ENDOCRINE: No polyuria, polydipsia, no heat or cold intolerance. No recent change in weight. HEMATOLOGICAL: No anemia or easy bruising or bleeding. NEUROLOGIC: No headache, seizures, numbness, tingling or weakness. PSYCHIATRIC: No depression, no loss of interest in normal activity or change in sleep pattern.     Exam:   BP 104/68 (BP Location: Right Arm)   Pulse 68   Resp 20   Ht 5' 8.5" (1.74 m)   Wt 223 lb 9.6 oz (101.4 kg)   LMP 12/27/2020 (Approximate)   BMI 33.50 kg/m   Body mass index is 33.5 kg/m.  General appearance :  Well developed well nourished female. No acute distress HEENT: Eyes: no retinal hemorrhage or exudates,  Neck supple, trachea midline, no carotid bruits, no thyroidmegaly Lungs: Clear to auscultation, no rhonchi or wheezes, or rib retractions  Heart: Regular rate and rhythm, no murmurs or gallops Breast:Examined in sitting and supine position were symmetrical in appearance, no palpable masses or tenderness,  no skin retraction, no nipple inversion, no nipple discharge, no skin discoloration, no axillary or supraclavicular lymphadenopathy Abdomen: no palpable masses or tenderness, no rebound or guarding Extremities: no edema or skin discoloration or tenderness  Pelvic: Vulva: Normal             Vagina: No gross lesions or discharge  Cervix: No gross lesions or discharge.  Pap reflex done.  Uterus  AV, normal size, shape and consistency, non-tender and mobile  Adnexa  Without masses or tenderness  Anus: Normal   Assessment/Plan:  25 y.o. female for annual exam   1. Encounter for routine gynecological examination with Papanicolaou smear of cervix Normal gynecologic exam.  History of CIN 1 with high risk HPV positive.  Pap test showing ASCUS with high risk HPV negative last year.  Pap reflex done today.  Breast exam normal. - Cytology - PAP( [facility])  2. Encounter for routine checking of intrauterine contraceptive device (IUD) ParaGard IUD since November 2021.  IUD well-tolerated.  IUD in good position.  Menstrual periods on the heavy side.  Recommend ibuprofen 600 mg per mouth every 6 hours during the first 3 to 4 days of the period.  3. Dysplasia of cervix, low grade (CIN 1) -Pap reflex done  4. Class 1 obesity due to excess calories without serious comorbidity with body mass index (BMI) of 33.0 to 33.9 in adult  Recommend a lower calorie/carb diet.  Aerobic activities 5 times a week and light weightlifting every 2 days.  Princess Bruins MD, 4:39 PM 02/01/2021

## 2021-02-02 LAB — CYTOLOGY - PAP: Diagnosis: NEGATIVE

## 2021-02-15 ENCOUNTER — Encounter: Payer: Self-pay | Admitting: *Deleted

## 2021-02-23 NOTE — Progress Notes (Signed)
Office Visit Note  Patient: Toni Livingston             Date of Birth: [birth year]           MRN: [id number]             PCP: Ma Hillock, DO Referring: Ma Hillock, DO Visit Date: 03/09/2021 Occupation: @GUAROCC @  Subjective:  Medication management.   History of Present Illness: Toni Livingston is a 25 y.o. female with a history of ankylosing spondylitis and iridocyclitis.  She states she took CellCept for couple of months earlier this year and then stopped the medication.  She thought it was causing some lymph node enlargement in her neck region.  When she stopped the medication the lymph node did not enlarge.  She states she has not had any episodes of eye inflammation in several months.  She has been using occasionally Durezol eyedrops.  She has been followed by Dr. Manuella Ghazi.  He is aware that she stopped CellCept.  She has not refilled prednisone since May 2022.  She denies any joint pain currently.  She has been experiencing shortness of breath after exertion for the last few months.  Activities of Daily Living:  Patient reports morning stiffness for 0  none .   Patient Reports nocturnal pain.  Difficulty dressing/grooming: Denies Difficulty climbing stairs: Denies Difficulty getting out of chair: Denies Difficulty using hands for taps, buttons, cutlery, and/or writing: Denies  Review of Systems  Constitutional:  Positive for fatigue.  HENT:  Negative for mouth dryness.   Eyes:  Negative for dryness.  Respiratory:  Positive for shortness of breath.   Cardiovascular:  Negative for swelling in legs/feet.  Gastrointestinal:  Negative for constipation.  Endocrine: Positive for cold intolerance and heat intolerance.  Genitourinary:  Negative for difficulty urinating.  Musculoskeletal:  Negative for joint pain and joint pain.  Skin:  Negative for color change, rash and sensitivity to sunlight.  Allergic/Immunologic: Negative for susceptible to infections.  Neurological:   Negative for numbness.  Hematological:  Positive for bruising/bleeding tendency.  Psychiatric/Behavioral:  Positive for sleep disturbance.    PMFS History:  Patient Active Problem List   Diagnosis Date Noted   Ankylosing spondylitis of sacral region ([place]) 03/09/2021   IUD (intrauterine device) in place-Paraguard 04/2020 05/22/2020   Closed fracture of fifth metacarpal bone 07/19/2019   HLA B27 (HLA B27 positive) 04/16/2019   ANA positive 04/16/2019   Iritis 04/10/2019   DDD (degenerative disc disease), lumbar 04/10/2019   Vaginal high risk HPV DNA test positive 11/19/2018   Obesity (BMI 30-39.9) 11/14/2018   Pilonidal cyst 11/14/2018   LGSIL of cervix of undetermined significance 11/14/2018   Irregular menses 11/14/2018   Encounter for well woman exam with routine gynecological exam 09/13/2017   Oral contraceptive use 07/27/2016   Encounter for long-term current use of medication 07/27/2016    Past Medical History:  Diagnosis Date   Ankylosing spondylitis ([place])    Iritis    Vertigo     Family History  Problem Relation Age of Onset   Hypertension Mother    Testicular cancer Father    Healthy Sister    Healthy Brother    Uterine cancer Maternal Aunt    Healthy Brother    Depression Sister    Anxiety disorder Sister    Past Surgical History:  Procedure Laterality Date   BREAST REDUCTION SURGERY  2015   HAND SURGERY Left 07/24/2019   Social History   Social History Narrative  Single. Some college.    Works as Therapist, nutritional.    Drinks caffeine.    Wears seatbelt. Smoke detector in the home.    Exercises routinely.    Feels safe in relationships.       Immunization History  Administered Date(s) Administered   Influenza,inj,Quad PF,6+ Mos 03/09/2017, 03/05/2018, 02/27/2019   Influenza-Unspecified 03/20/2016   PFIZER(Purple Top)SARS-COV-2 Vaccination 03/23/2020, 04/13/2020, 08/07/2020   PPD Test 07/27/2016   Tdap 07/27/2016     Objective: Vital Signs:  BP 130/80 (BP Location: Left Arm, Patient Position: Sitting, Cuff Size: Small)   Pulse 78   Resp 12   Ht 5' 8"  (1.727 m)   Wt 223 lb 12.8 oz (101.5 kg)   BMI 34.03 kg/m    Physical Exam Vitals and nursing note reviewed.  Constitutional:      Appearance: She is well-developed.  HENT:     Head: Normocephalic and atraumatic.  Eyes:     Conjunctiva/sclera: Conjunctivae normal.  Cardiovascular:     Rate and Rhythm: Normal rate and regular rhythm.     Heart sounds: Normal heart sounds.  Pulmonary:     Effort: Pulmonary effort is normal.     Breath sounds: Normal breath sounds.  Abdominal:     General: Bowel sounds are normal.     Palpations: Abdomen is soft.  Musculoskeletal:     Cervical back: Normal range of motion.  Lymphadenopathy:     Cervical: No cervical adenopathy.  Skin:    General: Skin is warm and dry.     Capillary Refill: Capillary refill takes less than 2 seconds.  Neurological:     Mental Status: She is alert and oriented to person, place, and time.  Psychiatric:        Behavior: Behavior normal.     Musculoskeletal Exam: C-spine thoracic and lumbar spine were in good range of motion.  She had no SI joint tenderness.  Shoulder joints, elbow joints, wrist joints, MCPs PIPs and DIPs with good range of motion with no synovitis.  Hip joints, knee joints, ankles, MTPs and PIPs with good range of motion with no synovitis.  There was no evidence of Achilles tendinitis or plantar fasciitis.  CDAI Exam: CDAI Score: -- Patient Global: --; Provider Global: -- Swollen: --; Tender: -- Joint Exam 03/09/2021   No joint exam has been documented for this visit   There is currently no information documented on the homunculus. Go to the Rheumatology activity and complete the homunculus joint exam.  Investigation: No additional findings.  Imaging: No results found.  Recent Labs: Lab Results  Component Value Date   WBC 5.4 10/06/2020   HGB 13.8 10/06/2020   PLT 434  (H) 10/06/2020   NA 138 10/06/2020   K 4.1 10/06/2020   CL 103 10/06/2020   CO2 26 10/06/2020   GLUCOSE 97 10/06/2020   BUN 9 10/06/2020   CREATININE 0.68 10/06/2020   BILITOT 0.4 10/06/2020   ALKPHOS 109 12/25/2019   AST 18 10/06/2020   ALT 18 10/06/2020   PROT 7.3 10/06/2020   ALBUMIN 4.2 12/25/2019   CALCIUM 9.6 10/06/2020   GFRAA 141 10/06/2020   QFTBGOLDPLUS NEGATIVE 04/01/2020    Speciality Comments: No specialty comments available.  Procedures:  No procedures performed Allergies: Patient has no known allergies.   Assessment / Plan:     Visit Diagnoses: Ankylosing spondylitis of sacral region [facility]) - History of chronic SI joint pain, SI joint sclerosis on the x-rays, HLA-B27 positive. SI joints injected  on 05/27/2020: She is doing well on Humira monotherapy currently.  She has been off prednisone since May.  She also discontinued CellCept after taking it for couple of months early this year.  She denies any major problems with iridocyclitis.    High risk medication use - Humira 40 mg sq injections every 14 days.  She has not had labs since October 06, 2020.  Need for getting labs every 3 months was emphasized.  We will check labs today and then every 3 months to monitor for drug toxicity.  She was also advised to have annual skin examination while she is on Humira.  We will refer her to dermatology.  She is advised to stop Humira in case she develops an infection and restart once infection resolves.  Updated information regarding realization was also given.  Iridocyclitis -followed by Dr. Manuella Ghazi.  She uses eyedrops as needed.  HLA B27 positive  Chronic SI joint pain-she denies any SI joint pain recently.  Shortness of breath-she has been experiencing some shortness of breath after exertion.  I will obtain chest x-ray PA and lateral today at [facility].  Have also advised her to follow-up with her PCP in case she has persistent shortness of breath.  She may need pulmonary  function test.  ANA positive - She has no clinical features of systemic lupus.   Pilonidal cyst-she states she has some irritation off and on.  Vaginal high risk HPV DNA test positive  Orders: No orders of the defined types were placed in this encounter.  No orders of the defined types were placed in this encounter.    Follow-Up Instructions: Return in about 5 months (around 08/09/2021) for Ankylosing spondylitis.   Bo Merino, MD  Note - This record has been created using Editor, commissioning.  Chart creation errors have been sought, but may not always  have been located. Such creation errors do not reflect on  the standard of medical care.

## 2021-03-09 ENCOUNTER — Ambulatory Visit (INDEPENDENT_AMBULATORY_CARE_PROVIDER_SITE_OTHER): Payer: Federal, State, Local not specified - PPO | Admitting: Rheumatology

## 2021-03-09 ENCOUNTER — Other Ambulatory Visit: Payer: Self-pay

## 2021-03-09 ENCOUNTER — Ambulatory Visit (HOSPITAL_COMMUNITY)
Admission: RE | Admit: 2021-03-09 | Discharge: 2021-03-09 | Disposition: A | Discharge status: Home / Self Care | Payer: Federal, State, Local not specified - PPO | Source: Ambulatory Visit | Attending: Rheumatology | Admitting: Rheumatology

## 2021-03-09 ENCOUNTER — Encounter: Payer: Self-pay | Admitting: Rheumatology

## 2021-03-09 VITALS — BP 130/80 | HR 78 | Resp 12 | Ht 68.0 in | Wt 223.8 lb

## 2021-03-09 DIAGNOSIS — H209 Unspecified iridocyclitis: Secondary | ICD-10-CM

## 2021-03-09 DIAGNOSIS — R0602 Shortness of breath: Secondary | ICD-10-CM | POA: Insufficient documentation

## 2021-03-09 DIAGNOSIS — R768 Other specified abnormal immunological findings in serum: Secondary | ICD-10-CM

## 2021-03-09 DIAGNOSIS — M533 Sacrococcygeal disorders, not elsewhere classified: Secondary | ICD-10-CM

## 2021-03-09 DIAGNOSIS — Z1589 Genetic susceptibility to other disease: Secondary | ICD-10-CM | POA: Diagnosis not present

## 2021-03-09 DIAGNOSIS — G8929 Other chronic pain: Secondary | ICD-10-CM

## 2021-03-09 DIAGNOSIS — M458 Ankylosing spondylitis sacral and sacrococcygeal region: Secondary | ICD-10-CM

## 2021-03-09 DIAGNOSIS — Z79899 Other long term (current) drug therapy: Secondary | ICD-10-CM | POA: Diagnosis not present

## 2021-03-09 DIAGNOSIS — Z111 Encounter for screening for respiratory tuberculosis: Secondary | ICD-10-CM | POA: Diagnosis not present

## 2021-03-09 DIAGNOSIS — R87811 Vaginal high risk human papillomavirus (HPV) DNA test positive: Secondary | ICD-10-CM

## 2021-03-09 DIAGNOSIS — R079 Chest pain, unspecified: Secondary | ICD-10-CM | POA: Diagnosis not present

## 2021-03-09 DIAGNOSIS — L0591 Pilonidal cyst without abscess: Secondary | ICD-10-CM

## 2021-03-09 NOTE — Patient Instructions (Signed)
Standing Labs We placed an order today for your standing lab work.   Please have your standing labs drawn in December and every 3 months  If possible, please have your labs drawn 2 weeks prior to your appointment so that the provider can discuss your results at your appointment.  Please note that you may see your imaging and lab results in [place] before we have reviewed them. We may be awaiting multiple results to interpret others before contacting you. Please allow our office up to 72 hours to thoroughly review all of the results before contacting the office for clarification of your results.  We have open lab daily: Monday through Thursday from 1:30-4:30 PM and Friday from 1:30-4:00 PM at the office of Dr. Bo Merino, [facility].   Please be advised, all patients with office appointments requiring lab work will take precedent over walk-in lab work.  If possible, please come for your lab work on Monday and Friday afternoons, as you may experience shorter wait times. The office is located at [address], [place], [place], [place] [address] No appointment is necessary.   Labs are drawn by [facility]. Please bring your co-pay at the time of your lab draw.  You may receive a bill from [facility] for your lab work.  If you wish to have your labs drawn at another location, please call the office 24 hours in advance to send orders.  If you have any questions regarding directions or hours of operation,  please call [phone].   As a reminder, please drink plenty of water prior to coming for your lab work. Thanks!   Vaccines You are taking a medication(s) that can suppress your immune system.  The following immunizations are recommended: Flu annually Covid-19  Td/Tdap (tetanus, diphtheria, pertussis) every 10 years Pneumonia (Prevnar 15 then Pneumovax 23 at least 1 year apart.  Alternatively, can take Prevnar 20 without needing additional dose) Shingrix: 2 doses from 4  weeks to 6 months apart  Please check with your PCP to make sure you are up to date.   If you test POSITIVE for COVID19 and have MILD to MODERATE symptoms: First, call your PCP if you would like to receive COVID19 treatment AND Hold your medications during the infection and for at least 1 week after your symptoms have resolved: Injectable medication (Benlysta, Cimzia, Cosentyx, Enbrel, Humira, Orencia, Remicade, Simponi, Stelara, Taltz, Tremfya) Methotrexate Leflunomide (Arava) Azathioprine Mycophenolate (Cellcept) Roma Kayser, or Rinvoq Otezla If you take Actemra or Kevzara, you DO NOT need to hold these for COVID19 infection.  If you test POSITIVE for COVID19 and have NO symptoms: First, call your PCP if you would like to receive COVID19 treatment AND Hold your medications for at least 10 days after the day that you tested positive Injectable medication (Benlysta, Cimzia, Cosentyx, Enbrel, Humira, Orencia, Remicade, Simponi, Stelara, Taltz, Tremfya) Methotrexate Leflunomide (Arava) Azathioprine Mycophenolate (Cellcept) Roma Kayser, or Rinvoq Otezla If you take Actemra or Kevzara, you DO NOT need to hold these for COVID19 infection.  If you have signs or symptoms of an infection or start antibiotics: First, call your PCP for workup of your infection. Hold your medication through the infection, until you complete your antibiotics, and until symptoms resolve if you take the following: Injectable medication (Actemra, Benlysta, Cimzia, Cosentyx, Enbrel, Humira, Kevzara, Orencia, Remicade, Simponi, Stelara, Taltz, Tremfya) Methotrexate Leflunomide (Arava) Mycophenolate (Cellcept) Morrie Sheldon, Olumiant, or Rinvoq   Please get annual skin examination to screen for nonmelanoma skin cancer while you are  on Humira.

## 2021-03-10 NOTE — Progress Notes (Signed)
CBC, CMP and sed rate are within normal limits.  Glucose is mildly elevated, probably not a fasting sample.

## 2021-03-11 NOTE — Progress Notes (Signed)
Chest xray is normal

## 2021-03-12 LAB — COMPLETE METABOLIC PANEL WITH GFR
AG Ratio: 1.5 (calc) (ref 1.0–2.5)
ALT: 15 U/L (ref 6–29)
AST: 19 U/L (ref 10–30)
Albumin: 4.2 g/dL (ref 3.6–5.1)
Alkaline phosphatase (APISO): 90 U/L (ref 31–125)
BUN: 8 mg/dL (ref 7–25)
CO2: 26 mmol/L (ref 20–32)
Calcium: 9.6 mg/dL (ref 8.6–10.2)
Chloride: 104 mmol/L (ref 98–110)
Creat: 0.77 mg/dL (ref 0.50–0.96)
Globulin: 2.8 g/dL (calc) (ref 1.9–3.7)
Glucose, Bld: 118 mg/dL — ABNORMAL HIGH (ref 65–99)
Potassium: 3.9 mmol/L (ref 3.5–5.3)
Sodium: 138 mmol/L (ref 135–146)
Total Bilirubin: 0.3 mg/dL (ref 0.2–1.2)
Total Protein: 7 g/dL (ref 6.1–8.1)
eGFR: 110 mL/min/{1.73_m2} (ref >=60)

## 2021-03-12 LAB — CBC WITH DIFFERENTIAL/PLATELET
Absolute Monocytes: 442 cells/uL (ref 200–950)
Basophils Absolute: 20 cells/uL (ref 0–200)
Basophils Relative: 0.3 %
Eosinophils Absolute: 170 cells/uL (ref 15–500)
Eosinophils Relative: 2.5 %
HCT: 41.7 % (ref 35.0–45.0)
Hemoglobin: 13.2 g/dL (ref 11.7–15.5)
Lymphs Abs: 1374 cells/uL (ref 850–3900)
MCH: 26.5 pg — ABNORMAL LOW (ref 27.0–33.0)
MCHC: 31.7 g/dL — ABNORMAL LOW (ref 32.0–36.0)
MCV: 83.7 fL (ref 80.0–100.0)
MPV: 10.2 fL (ref 7.5–12.5)
Monocytes Relative: 6.5 %
Neutro Abs: 4794 cells/uL (ref 1500–7800)
Neutrophils Relative %: 70.5 %
Platelets: 386 10*3/uL (ref 140–400)
RBC: 4.98 10*6/uL (ref 3.80–5.10)
RDW: 13.1 % (ref 11.0–15.0)
Total Lymphocyte: 20.2 %
WBC: 6.8 10*3/uL (ref 3.8–10.8)

## 2021-03-12 LAB — SEDIMENTATION RATE: Sed Rate: 17 mm/h (ref 0–20)

## 2021-03-12 LAB — QUANTIFERON-TB GOLD PLUS
Mitogen-NIL: 6.89 IU/mL
NIL: 0.03 IU/mL
QuantiFERON-TB Gold Plus: NEGATIVE
TB1-NIL: 0 IU/mL
TB2-NIL: 0 IU/mL

## 2021-03-12 NOTE — Progress Notes (Signed)
TB Gold is negative.

## 2021-03-15 ENCOUNTER — Telehealth: Payer: Self-pay | Admitting: Dermatology

## 2021-03-15 NOTE — Telephone Encounter (Signed)
Please contact referrer, Dr Estanislado Pandy,  and see if they can get her in somewhere else before March

## 2021-03-16 NOTE — Telephone Encounter (Signed)
Notes documented and referral routed back to referring office. 

## 2021-03-25 ENCOUNTER — Other Ambulatory Visit: Payer: Self-pay | Admitting: Physician Assistant

## 2021-03-25 DIAGNOSIS — M458 Ankylosing spondylitis sacral and sacrococcygeal region: Secondary | ICD-10-CM

## 2021-03-25 NOTE — Telephone Encounter (Signed)
Next Visit: 08/10/2021  Last Visit: 03/09/2021  Last Fill: 01/12/2021  DX:Ankylosing spondylitis of sacral region   Current Dose per office note 03/09/2021: Humira 40 mg sq injections every 14 days  Labs: 03/09/2021 CBC, CMP and sed rate are within normal limits.  Glucose is mildly elevated, probably not a fasting sample.  TB Gold: 03/09/2021 Neg   Okay to refill Humira?

## 2021-04-07 ENCOUNTER — Telehealth: Payer: Self-pay | Admitting: Physician Assistant

## 2021-04-07 NOTE — Telephone Encounter (Signed)
Referral opened back up, referral attached to appointment.

## 2021-04-07 NOTE — Telephone Encounter (Signed)
Patient is calling for a referral appointment from Bo Merino, M.D.  Patient is scheduled for 10/05/2021 at 9:00 AM with [facility], PA-C.

## 2021-05-04 DIAGNOSIS — Q141 Congenital malformation of retina: Secondary | ICD-10-CM | POA: Diagnosis not present

## 2021-05-04 DIAGNOSIS — H3581 Retinal edema: Secondary | ICD-10-CM | POA: Diagnosis not present

## 2021-05-04 DIAGNOSIS — H209 Unspecified iridocyclitis: Secondary | ICD-10-CM | POA: Diagnosis not present

## 2021-05-25 ENCOUNTER — Telehealth: Payer: Self-pay

## 2021-05-25 NOTE — Telephone Encounter (Signed)
PA renewal initiated automatically by [facility]  Received notification from [facility] regarding a prior authorization for HUMIRA. Authorization has been APPROVED from 04/25/2021 to 11/21/2022. Approval letter sent to scan center.  Patient can continue to fill through [facility]: [phone]   Authorization # [id number] CMM Key: [id number]

## 2021-06-02 ENCOUNTER — Other Ambulatory Visit: Payer: Self-pay | Admitting: Rheumatology

## 2021-06-02 DIAGNOSIS — Z79899 Other long term (current) drug therapy: Secondary | ICD-10-CM

## 2021-06-02 DIAGNOSIS — M458 Ankylosing spondylitis sacral and sacrococcygeal region: Secondary | ICD-10-CM

## 2021-06-02 DIAGNOSIS — Z111 Encounter for screening for respiratory tuberculosis: Secondary | ICD-10-CM

## 2021-06-02 DIAGNOSIS — Z9225 Personal history of immunosupression therapy: Secondary | ICD-10-CM

## 2021-06-02 MED ORDER — HUMIRA (2 PEN) 40 MG/0.4ML ~~LOC~~ AJKT: AUTO-INJECTOR | SUBCUTANEOUS | 2 refills | Status: DC

## 2021-06-02 NOTE — Telephone Encounter (Signed)
Pharmacy called requesting a prescription refill for Toni Livingston of Humira Pen 40mg /0.36ml to be sent to [place].  [phone]

## 2021-06-02 NOTE — Telephone Encounter (Signed)
Next Visit: 08/10/2021  Last Visit: 03/09/2021  Last Fill: 03/25/2021  DX:Ankylosing spondylitis of sacral region   Current Dose per office note 03/09/2021: Humira 40 mg sq injections every 14 days  Labs: 03/09/2021 CBC, CMP and sed rate are within normal limits  TB Gold: 03/09/2021 Neg   Patient reminded she is due to update labs this month. Patient will try to come this week to update.   Okay to refill Humira?

## 2021-06-03 ENCOUNTER — Other Ambulatory Visit: Payer: Self-pay | Admitting: *Deleted

## 2021-06-03 DIAGNOSIS — Z79899 Other long term (current) drug therapy: Secondary | ICD-10-CM

## 2021-06-04 ENCOUNTER — Ambulatory Visit: Payer: Federal, State, Local not specified - PPO | Admitting: Family Medicine

## 2021-06-04 ENCOUNTER — Encounter: Payer: Self-pay | Admitting: Family Medicine

## 2021-06-04 ENCOUNTER — Other Ambulatory Visit: Payer: Self-pay

## 2021-06-04 VITALS — BP 95/62 | HR 87 | Temp 98.6°F | Ht 68.0 in | Wt 223.0 lb

## 2021-06-04 DIAGNOSIS — Q141 Congenital malformation of retina: Secondary | ICD-10-CM

## 2021-06-04 DIAGNOSIS — Z23 Encounter for immunization: Secondary | ICD-10-CM

## 2021-06-04 DIAGNOSIS — R768 Other specified abnormal immunological findings in serum: Secondary | ICD-10-CM

## 2021-06-04 DIAGNOSIS — R7689 Other specified abnormal immunological findings in serum: Secondary | ICD-10-CM

## 2021-06-04 DIAGNOSIS — Z1589 Genetic susceptibility to other disease: Secondary | ICD-10-CM

## 2021-06-04 DIAGNOSIS — R0789 Other chest pain: Secondary | ICD-10-CM

## 2021-06-04 DIAGNOSIS — H209 Unspecified iridocyclitis: Secondary | ICD-10-CM

## 2021-06-04 DIAGNOSIS — M458 Ankylosing spondylitis sacral and sacrococcygeal region: Secondary | ICD-10-CM

## 2021-06-04 DIAGNOSIS — R0602 Shortness of breath: Secondary | ICD-10-CM | POA: Diagnosis not present

## 2021-06-04 LAB — COMPLETE METABOLIC PANEL WITHOUT GFR
AG Ratio: 1.5 (calc) (ref 1.0–2.5)
ALT: 15 U/L (ref 6–29)
AST: 19 U/L (ref 10–30)
Albumin: 4.4 g/dL (ref 3.6–5.1)
Alkaline phosphatase (APISO): 101 U/L (ref 31–125)
BUN: 9 mg/dL (ref 7–25)
CO2: 28 mmol/L (ref 20–32)
Calcium: 9.6 mg/dL (ref 8.6–10.2)
Chloride: 105 mmol/L (ref 98–110)
Creat: 0.87 mg/dL (ref 0.50–0.96)
Globulin: 2.9 g/dL (ref 1.9–3.7)
Glucose, Bld: 95 mg/dL (ref 65–99)
Potassium: 4.3 mmol/L (ref 3.5–5.3)
Sodium: 140 mmol/L (ref 135–146)
Total Bilirubin: 0.3 mg/dL (ref 0.2–1.2)
Total Protein: 7.3 g/dL (ref 6.1–8.1)
eGFR: 94 mL/min/1.73m2

## 2021-06-04 LAB — CBC WITH DIFFERENTIAL/PLATELET
Absolute Monocytes: 473 cells/uL (ref 200–950)
Basophils Absolute: 61 cells/uL (ref 0–200)
Basophils Relative: 1.1 %
Eosinophils Absolute: 204 cells/uL (ref 15–500)
Eosinophils Relative: 3.7 %
HCT: 41.8 % (ref 35.0–45.0)
Hemoglobin: 13.8 g/dL (ref 11.7–15.5)
Lymphs Abs: 1414 cells/uL (ref 850–3900)
MCH: 26.7 pg — ABNORMAL LOW (ref 27.0–33.0)
MCHC: 33 g/dL (ref 32.0–36.0)
MCV: 81 fL (ref 80.0–100.0)
MPV: 10.3 fL (ref 7.5–12.5)
Monocytes Relative: 8.6 %
Neutro Abs: 3350 cells/uL (ref 1500–7800)
Neutrophils Relative %: 60.9 %
Platelets: 417 10*3/uL — ABNORMAL HIGH (ref 140–400)
RBC: 5.16 10*6/uL — ABNORMAL HIGH (ref 3.80–5.10)
RDW: 13.2 % (ref 11.0–15.0)
Total Lymphocyte: 25.7 %
WBC: 5.5 10*3/uL (ref 3.8–10.8)

## 2021-06-04 MED ORDER — ALBUTEROL SULFATE 108 (90 BASE) MCG/ACT IN AEPB: INHALATION_SPRAY | RESPIRATORY_TRACT | 0 refills | Status: DC

## 2021-06-04 NOTE — Progress Notes (Signed)
This visit occurred during the SARS-CoV-2 public health emergency.  Safety protocols were in place, including screening questions prior to the visit, additional usage of staff PPE, and extensive cleaning of exam room while observing appropriate contact time as indicated for disinfecting solutions.    Toni Livingston , February 28, 1996, 25 y.o., female MRN: [id number] Patient Care Team    Relationship Specialty Notifications Start End  Ma Hillock, DO PCP - General Family Medicine  07/27/16   Starling Manns, MD  Orthopedic Surgery  04/16/19   Virgina Evener, [place] Referring Physician Optometry  04/16/19   Princess Bruins, MD Consulting Physician Obstetrics and Gynecology  11/11/19   Bo Merino, MD Consulting Physician Rheumatology  11/11/19     Chief Complaint  Patient presents with   Chest Pain    Pt c/o chest pain, SOB on exertion and dizziness x 6 mos; CXR was neg in Sept/Oct     Subjective: Pt presents for an OV with complaints of left sternum discomfort and mid back discomfort with deep inhales and exhales.  She states she will have sharp pains and feel tight in these locations.  She is having difficulty laying on her side the pressures pressure near her sternum.  She reports mild feelings of shortness of breath but more tightness.  She has been dizzy on occasions, no syncope.  This has occurred for approximately 6 months.  She denies any fever, chills, nausea, vomit or acute illness around this time. She does have a significant medical history of HLA-B27 positivity and rheumatological disorder with eye manifestations. 06/03/2021: CMP normal (Ca normal) 03/09/2021: CXR normal  Depression screen [facility] 2/9 06/04/2021 11/14/2018 09/13/2017 07/27/2016  Decreased Interest 0 0 0 0  Down, Depressed, Hopeless 0 0 0 0  PHQ - 2 Score 0 0 0 0    No Known Allergies Social History   Social History Narrative   Single. Some college.    Works as Therapist, nutritional.    Drinks  caffeine.    Wears seatbelt. Smoke detector in the home.    Exercises routinely.    Feels safe in relationships.       Past Medical History:  Diagnosis Date   Ankylosing spondylitis ([place])    Closed fracture of fifth metacarpal bone 07/19/2019   Iritis    LGSIL of cervix of undetermined significance 11/14/2018   Vaginal high risk HPV DNA test positive 11/19/2018   Vertigo    Past Surgical History:  Procedure Laterality Date   BREAST REDUCTION SURGERY  2015   HAND SURGERY Left 07/24/2019   Family History  Problem Relation Age of Onset   Hypertension Mother    Testicular cancer Father    Healthy Sister    Healthy Brother    Uterine cancer Maternal Aunt    Healthy Brother    Depression Sister    Anxiety disorder Sister    Allergies as of 06/04/2021   No Known Allergies      Medication List        Accurate as of June 04, 2021 11:59 PM. If you have any questions, ask your nurse or doctor.          STOP taking these medications    fluticasone 50 MCG/ACT nasal spray Commonly known as: FLONASE Stopped by: Howard Pouch, DO       TAKE these medications    Albuterol Sulfate 108 (90 Base) MCG/ACT Aepb Commonly known as: PROAIR RESPICLICK 2 puffs 20 minutes prior to exercise  activity Started by: Howard Pouch, DO   dorzolamide-timolol 22.3-6.8 MG/ML ophthalmic solution Commonly known as: COSOPT SMARTSIG:In Eye(s)   DUREZOL OP Apply to eye as needed.   Humira Pen 40 MG/0.4ML Pnkt Generic drug: Adalimumab INJECT 40MG SUBCUTANEOUSLY  EVERY 14 DAYS   PATADAY OP Apply to eye as needed.        All past medical history, surgical history, allergies, family history, immunizations andmedications were updated in the EMR today and reviewed under the history and medication portions of their EMR.     ROS Negative, with the exception of above mentioned in HPI   Objective:  BP 95/62    Pulse 87    Temp 98.6 F (37 C) (Oral)    Ht 5' 8"  (1.727 m)    Wt 223 lb  (101.2 kg)    SpO2 98%    BMI 33.91 kg/m  Body mass index is 33.91 kg/m. Physical Exam Gen: Afebrile. No acute distress. Nontoxic in appearance, well developed, well nourished.  HENT: AT. [place]. MMM, no oral lesions.  No cough.  No hoarseness. Eyes:Pupils Equal Round Reactive to light, Extraocular movements intact,  Conjunctiva without redness, discharge or icterus. Neck/lymp/endocrine: Supple, no lymphadenopathy CV: RRR no murmur, no edema Chest: CTAB, no wheeze or crackles. Good air movement, normal resp effort.  Mild tenderness to palpation over left upper sternum/rib head Skin: No rashes, purpura or petechiae.  Neuro: Normal gait. PERLA. EOMi. Alert. Oriented x3  Psych: Normal affect, dress and demeanor. Normal speech. Normal thought content and judgment.  No results found. No results found. No results found for this or any previous visit (from the past 24 hour(s)).  Assessment/Plan: Toni Livingston is a 25 y.o. female present for OV for  Shortness of breath/HLA-B27 positive/chest wall discomfort Discussed different possible etiologies including sarcoid, association with her autoimmune disorder, interstitial lung disorder, polychondritis - Angiotensin converting enzyme - Alpha-1-antitrypsin - ANCA Screen Reflex Titer - Sedimentation rate - C-reactive protein -CT chest without ordered for further evaluation.  Reviewed x-ray completed 3 months ago for condition. -Also discussed possibility of reactive airway disease/exercise-induced asthma since some symptoms are when initiating exercise.  Albuterol inhaler trial prescribed today.  Patient provided with directions.   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  Influenza vaccine administered today.  Reviewed expectations  re: course of current medical issues. Discussed self-management of symptoms. Outlined signs and symptoms indicating need for more acute intervention. Patient verbalized understanding and all questions were answered. Patient received an After-Visit Summary.    Orders Placed This Encounter  Procedures   CT Chest Wo Contrast   Flu Vaccine QUAD 6+ mos PF IM (Fluarix Quad PF)   Angiotensin converting enzyme   Alpha-1-antitrypsin   ANCA Screen Reflex Titer   Sedimentation rate   C-reactive protein   Meds ordered this encounter  Medications   Albuterol Sulfate (PROAIR RESPICLICK) 397 (90 Base) MCG/ACT AEPB    Sig: 2 puffs 20 minutes prior to exercise activity    Dispense:  1 each    Refill:  0    May substitute with any albuterol inhaler on formulary.   Referral Orders  No referral(s) requested today     Note is dictated utilizing voice recognition software. Although note has been proof read prior to signing, occasional typographical errors still can be missed. If any questions arise, please do not hesitate  to call for verification.   electronically signed by:  Howard Pouch, DO  [place]

## 2021-06-04 NOTE — Patient Instructions (Signed)
°  Great to see you today.  I have refilled the medication(s) we provide.   If labs were collected, we will inform you of lab results once received either by echart message or telephone call.   - echart message- for normal results that have been seen by the patient already.   - telephone call: abnormal results or if patient has not viewed results in their echart.  I plan to order a CT chest> they will call you to schedule.

## 2021-06-05 ENCOUNTER — Encounter: Payer: Self-pay | Admitting: Family Medicine

## 2021-06-07 ENCOUNTER — Encounter: Payer: Self-pay | Admitting: Family Medicine

## 2021-06-07 LAB — C-REACTIVE PROTEIN: CRP: 9.8 mg/L — ABNORMAL HIGH (ref <8.0)

## 2021-06-07 LAB — ANCA SCREEN W REFLEX TITER
ANCA Screen: POSITIVE — AB
Atypical P-ANCA titer: 1:80 {titer} — ABNORMAL HIGH

## 2021-06-07 LAB — SEDIMENTATION RATE: Sed Rate: 11 mm/h (ref 0–20)

## 2021-06-07 LAB — ALPHA-1-ANTITRYPSIN: A-1 Antitrypsin, Ser: 158 mg/dL (ref 83–199)

## 2021-06-07 LAB — ANGIOTENSIN CONVERTING ENZYME: Angiotensin-Converting Enzyme: 20 U/L (ref 9–67)

## 2021-06-07 NOTE — Telephone Encounter (Signed)
Please inform patient I will cover her for her absence yesterday and today, I will not be able to cover any further additional absences for a influenza vaccine.  The influenza vaccine is not a live virus and therefore cannot cause illness.  It causes some mild side effects in some patients that are usually resolved within 24 to 48 hours. I suspect she may have an acute illness.  If symptoms continue would encourage her to be seen for acute illness.  As far as her lab work, I am still waiting on the majority of the results. However she does have a mildly elevated CRP, indicating a inflammatory process.  It is just mildly elevated and could be related more towards her known autoimmune disease.  We will need to wait on the rest of the results before we know for sure.  I also ordered the CT chest to be completed, they should be calling her to get that scheduled as soon as her insurance approves.

## 2021-06-07 NOTE — Telephone Encounter (Signed)
Please advise if pt needs to be eval.

## 2021-06-08 ENCOUNTER — Encounter: Payer: Self-pay | Admitting: Family Medicine

## 2021-06-08 ENCOUNTER — Telehealth: Payer: Self-pay | Admitting: Family Medicine

## 2021-06-08 DIAGNOSIS — R768 Other specified abnormal immunological findings in serum: Secondary | ICD-10-CM

## 2021-06-08 NOTE — Telephone Encounter (Signed)
Please inform patient the following information: Her labs are normal with the exception of a mildly elevated CRP, which is an inflammatory marker- AND the atypical p-ANCA test is also positive.  This test can be positive with autoimmune disorders such as lupus, inflammation in the bowel (chrons or ulcerative colitis), rheumatoid arthritis... just to name a few.   We will know more once her CT chest is complete. Given these results, I would recommend we refer her to pulmonology for her breathing complaints. It takes some time to get into them, so I wanted to place referral while we get CT chest.  Also, She has had complaints of bowel habit changes with irritable bowel features in the past. With the atypical p-anca test positive it would be a good idea to be evaluated with gastroenterology to make sure colon is normal.    I have placed those referrals and she will also here back from [place] once we get her chest CT results.

## 2021-06-08 NOTE — Telephone Encounter (Signed)
Spoke with pt regarding labs and instructions.   

## 2021-06-11 ENCOUNTER — Ambulatory Visit (HOSPITAL_BASED_OUTPATIENT_CLINIC_OR_DEPARTMENT_OTHER)
Admission: RE | Admit: 2021-06-11 | Discharge: 2021-06-11 | Disposition: A | Discharge status: Home / Self Care | Payer: Federal, State, Local not specified - PPO | Source: Ambulatory Visit | Attending: Family Medicine | Admitting: Family Medicine

## 2021-06-11 ENCOUNTER — Other Ambulatory Visit: Payer: Self-pay

## 2021-06-11 DIAGNOSIS — R0602 Shortness of breath: Secondary | ICD-10-CM | POA: Diagnosis not present

## 2021-06-11 DIAGNOSIS — Z1589 Genetic susceptibility to other disease: Secondary | ICD-10-CM | POA: Insufficient documentation

## 2021-06-11 DIAGNOSIS — R768 Other specified abnormal immunological findings in serum: Secondary | ICD-10-CM | POA: Diagnosis not present

## 2021-06-11 DIAGNOSIS — R0789 Other chest pain: Secondary | ICD-10-CM | POA: Insufficient documentation

## 2021-06-16 ENCOUNTER — Other Ambulatory Visit: Payer: Self-pay | Admitting: Physician Assistant

## 2021-06-16 DIAGNOSIS — M458 Ankylosing spondylitis sacral and sacrococcygeal region: Secondary | ICD-10-CM

## 2021-06-16 NOTE — Telephone Encounter (Signed)
Next Visit: 08/10/2021   Last Visit: 03/09/2021   DX:Ankylosing spondylitis of sacral region    Current Dose per office note 03/09/2021: Humira 40 mg sq injections every 14 days   Labs: 06/03/2021 CMP WNL. RBC count is borderline elevated.  Plt count is slightly elevated but stable.  We will continue to monitor   TB Gold: 03/09/2021 Neg   Patient has to fill through [facility] instead of [facility].   Okay to refill Humira?

## 2021-06-18 MED ORDER — HUMIRA (2 PEN) 40 MG/0.4ML ~~LOC~~ AJKT: AUTO-INJECTOR | SUBCUTANEOUS | 2 refills | Status: DC

## 2021-06-18 NOTE — Addendum Note (Signed)
Addended by: Carole Binning on: 06/18/2021 01:48 PM   Modules accepted: Orders

## 2021-07-02 ENCOUNTER — Encounter: Payer: Self-pay | Admitting: Physician Assistant

## 2021-07-02 ENCOUNTER — Ambulatory Visit (INDEPENDENT_AMBULATORY_CARE_PROVIDER_SITE_OTHER): Payer: Federal, State, Local not specified - PPO | Admitting: Physician Assistant

## 2021-07-02 VITALS — BP 110/62 | HR 97 | Ht 68.0 in | Wt 225.0 lb

## 2021-07-02 DIAGNOSIS — R194 Change in bowel habit: Secondary | ICD-10-CM

## 2021-07-02 DIAGNOSIS — R768 Other specified abnormal immunological findings in serum: Secondary | ICD-10-CM | POA: Diagnosis not present

## 2021-07-02 DIAGNOSIS — R197 Diarrhea, unspecified: Secondary | ICD-10-CM | POA: Diagnosis not present

## 2021-07-02 DIAGNOSIS — R7689 Other specified abnormal immunological findings in serum: Secondary | ICD-10-CM

## 2021-07-02 MED ORDER — PLENVU 140 G PO SOLR: 140.0000 g | ORAL | 0 refills | Status: DC

## 2021-07-02 NOTE — Progress Notes (Signed)
Chief Complaint: Diarrhea and positive p-ANCA  HPI:    Toni Livingston is a 26 year old female, known to Dr. Ardis Hughs for elevated LFTs, with past medical history of ankylosing spondylitis and others listed below, who was referred to me by Howard Pouch A, DO for a complaint of diarrhea and positive p-ANCA.    12/25/2019 patient seen in clinic by Dr. Ardis Hughs for elevated LFTs.  That time associate ankylosing spondylitis and has been on Humira.  She also had intermittent iritis and her rheumatologist wanted to start her on Methotrexate however noticed her blood work and elevated LFTs.  At that time discussed management and epigastric pains which seems somewhat biliary in nature and she had an ultrasound as well as a complete liver work-up.  All her labs are normal and in fact her LFTs went back to normal as well.    01/01/2020 ultrasound the abdomen was normal.    06/03/2021 CMP normal.    06/04/2021 patient saw her PCP for shortness of breath as well as some dizziness.  I discussed possibility of sarcoid in association with her autoimmune disorder like interstitial lung disorder etc.  Various labs were run including an ANCA screen reflex titer and alpha-1 antitrypsin.  ANCA lab came back with positive P-ANCA.  CRP also elevated 9.8.  ESR normal.    Today, patient explains that ever since moving to [place] from [place] in 2017 she had a change in her bowel habits noting that she leaned more towards urgent episodes of diarrhea.  Explains that now over the past few weeks things have worsened and typically she will have a day where she goes out to eat or eats something and 30 minutes later she will have urgent loose stool and then be constipated for about 2 days until it happens again.  Does have a lot of abdominal cramping before a bowel movement which seems better afterwards.  Certain foods tend to make this worse including greasy fatty foods and sometimes dairy.  She and her husband have both  decreased dairy which seems to help slightly.    Denies fever, chills, weight loss, blood in her stool or symptoms that awaken her from sleep.  Past Medical History:  Diagnosis Date   Ankylosing spondylitis ([place])    Closed fracture of fifth metacarpal bone 07/19/2019   Iritis    LGSIL of cervix of undetermined significance 11/14/2018   Vaginal high risk HPV DNA test positive 11/19/2018   Vertigo     Past Surgical History:  Procedure Laterality Date   BREAST REDUCTION SURGERY  2015   HAND SURGERY Left 07/24/2019    Current Outpatient Medications  Medication Sig Dispense Refill   Adalimumab (HUMIRA PEN) 40 MG/0.4ML PNKT INJECT 40MG SUBCUTANEOUSLY  EVERY 2 WEEKS 2 each 2   Albuterol Sulfate (PROAIR RESPICLICK) 768 (90 Base) MCG/ACT AEPB 2 puffs 20 minutes prior to exercise activity 1 each 0   Difluprednate (DUREZOL OP) Apply to eye as needed.     dorzolamide-timolol (COSOPT) 22.3-6.8 MG/ML ophthalmic solution SMARTSIG:In Eye(s)     Olopatadine HCl (PATADAY OP) Apply to eye as needed.     No current facility-administered medications for this visit.    Allergies as of 07/02/2021   (No Known Allergies)    Family History  Problem Relation Age of Onset   Hypertension Mother    Testicular cancer Father    Healthy Sister    Healthy Brother    Uterine cancer Maternal Aunt    Healthy Brother  Depression Sister    Anxiety disorder Sister     Social History   Socioeconomic History   Marital status: Married    Spouse name: Not on file   Number of children: 0   Years of education: 13   Highest education level: Not on file  Occupational History   Occupation: Consulting civil engineer  Tobacco Use   Smoking status: Never   Smokeless tobacco: Never  Vaping Use   Vaping Use: Never used  Substance and Sexual Activity   Alcohol use: Yes    Comment: occ   Drug use: No   Sexual activity: Yes    Partners: Male    Birth control/protection: I.U.D.    Comment: 1st intercourse- 16,  partners- 17, current partner - 3 yrs  Other Topics Concern   Not on file  Social History Narrative   Single. Some college.    Works as Therapist, nutritional.    Drinks caffeine.    Wears seatbelt. Smoke detector in the home.    Exercises routinely.    Feels safe in relationships.       Social Determinants of Health   Financial Resource Strain: Not on file  Food Insecurity: Not on file  Transportation Needs: Not on file  Physical Activity: Not on file  Stress: Not on file  Social Connections: Not on file  Intimate Partner Violence: Not on file    Review of Systems:    Constitutional: No weight loss, fever or chills Cardiovascular: No chest pain Respiratory: No SOB  Gastrointestinal: See HPI and otherwise negative   Physical Exam:  Vital signs: BP 110/62    Pulse 97    Ht 5' 8"  (1.727 m)    Wt 225 lb (102.1 kg)    BMI 34.21 kg/m   Constitutional:   Pleasant Caucasian female appears to be in NAD, Well developed, Well nourished, alert and cooperative Respiratory: Respirations even and unlabored. Lungs clear to auscultation bilaterally.   No wheezes, crackles, or rhonchi.  Cardiovascular: Normal S1, S2. No MRG. Regular rate and rhythm. No peripheral edema, cyanosis or pallor.  Gastrointestinal:  Soft, nondistended, nontender. No rebound or guarding. Normal bowel sounds. No appreciable masses or hepatomegaly. Rectal:  Not performed.  Psychiatric:  Demonstrates good judgement and reason without abnormal affect or behaviors.  RELEVANT LABS AND IMAGING: CBC    Component Value Date/Time   WBC 5.5 06/03/2021 1403   RBC 5.16 (H) 06/03/2021 1403   HGB 13.8 06/03/2021 1403   HCT 41.8 06/03/2021 1403   PLT 417 (H) 06/03/2021 1403   MCV 81.0 06/03/2021 1403   MCH 26.7 (L) 06/03/2021 1403   MCHC 33.0 06/03/2021 1403   RDW 13.2 06/03/2021 1403   LYMPHSABS 1,414 06/03/2021 1403   MONOABS 0.4 09/13/2017 1002   EOSABS 204 06/03/2021 1403   BASOSABS 61 06/03/2021 1403    CMP      Component Value Date/Time   NA 140 06/03/2021 1403   K 4.3 06/03/2021 1403   CL 105 06/03/2021 1403   CO2 28 06/03/2021 1403   GLUCOSE 95 06/03/2021 1403   BUN 9 06/03/2021 1403   CREATININE 0.87 06/03/2021 1403   CALCIUM 9.6 06/03/2021 1403   PROT 7.3 06/03/2021 1403   ALBUMIN 4.2 12/25/2019 1518   AST 19 06/03/2021 1403   ALT 15 06/03/2021 1403   ALKPHOS 109 12/25/2019 1518   BILITOT 0.3 06/03/2021 1403   GFRNONAA 122 10/06/2020 1132   GFRAA 141 10/06/2020 1132    Assessment: 1.  Change in bowel habits: Towards episodes of diarrhea which are very urgent, recently positive CRP and P-ANCA, concern from PCP regarding IBD, does describe some abdominal cramping relieved with a diarrheal bowel movement, history of ankylosing spondylitis as well on Humira; consider IBS versus IBD  Plan: 1.  Discussed with patient that due to her recent lab testing and her symptoms of recommend a colonoscopy to consider IBD.  Scheduled patient for diagnostic colonoscopy in the [place] with Dr. Ardis Hughs.  Did provide the patient a detailed list of risks for the procedure and she agrees to proceed. Patient is appropriate for endoscopic procedure(s) in the ambulatory ([place]) setting.  2.  Patient to follow in clinic per recommendations from Dr. Ardis Hughs after time of procedure.  Ellouise Newer, PA-C [facility] 07/02/2021, 1:48 PM  Cc: Howard Pouch A, DO

## 2021-07-02 NOTE — Progress Notes (Signed)
I agree with the above note, plan 

## 2021-07-02 NOTE — Patient Instructions (Signed)
If you are age 27 or older, your body mass index should be between 23-30. Your Body mass index is 34.21 kg/m. If this is out of the aforementioned range listed, please consider follow up with your Primary Care Provider.  If you are age 45 or younger, your body mass index should be between 19-25. Your Body mass index is 34.21 kg/m. If this is out of the aformentioned range listed, please consider follow up with your Primary Care Provider.   ________________________________________________________  The [facility] providers would like to encourage you to use [facility] to communicate with providers for non-urgent requests or questions.  Due to long hold times on the telephone, sending your provider a message by [facility] may be a faster and more efficient way to get a response.  Please allow 48 business hours for a response.  Please remember that this is for non-urgent requests.  _______________________________________________________  Toni Livingston have been scheduled for a colonoscopy. Please follow written instructions given to you at your visit today.  Please pick up your prep supplies at the pharmacy within the next 1-3 days. If you use inhalers (even only as needed), please bring them with you on the day of your procedure.  Due to recent changes in healthcare laws, you may see the results of your imaging and laboratory studies on MyChart before your provider has had a chance to review them.  We understand that in some cases there may be results that are confusing or concerning to you. Not all laboratory results come back in the same time frame and the provider may be waiting for multiple results in order to interpret others.  Please give [place] 48 hours in order for your provider to thoroughly review all the results before contacting the office for clarification of your results.   It was a pleasure to see you today!  Thank you for trusting me with your gastrointestinal care!

## 2021-07-21 HISTORY — PX: COLONOSCOPY: SHX174

## 2021-07-23 ENCOUNTER — Encounter: Payer: Self-pay | Admitting: Internal Medicine

## 2021-07-23 ENCOUNTER — Ambulatory Visit (INDEPENDENT_AMBULATORY_CARE_PROVIDER_SITE_OTHER): Payer: Federal, State, Local not specified - PPO | Admitting: Internal Medicine

## 2021-07-23 ENCOUNTER — Other Ambulatory Visit: Payer: Self-pay

## 2021-07-23 VITALS — BP 112/78 | HR 86 | Temp 98.2°F | Ht 68.0 in | Wt 223.2 lb

## 2021-07-23 DIAGNOSIS — R0602 Shortness of breath: Secondary | ICD-10-CM

## 2021-07-23 DIAGNOSIS — M458 Ankylosing spondylitis sacral and sacrococcygeal region: Secondary | ICD-10-CM | POA: Diagnosis not present

## 2021-07-23 NOTE — Patient Instructions (Signed)
Please schedule follow up scheduled with myself in 2 weeks  If my schedule is not open yet, we will contact you with a reminder closer to that time. Please call [phone] if you haven't heard from [place] a month before.   Before your next visit I would like you to have: Full set of PFTs  Take the albuterol rescue inhaler every 4 to 6 hours as needed for wheezing or shortness of breath. You can also take it 15 minutes before exercise or exertional activity. Side effects include heart racing or pounding, jitters or anxiety. If you have a history of an irregular heart rhythm, it can make this worse. Can also give some patients a hard time sleeping.  To inhale the aerosol using an inhaler, follow these steps:  Remove the protective dust cap from the end of the mouthpiece. If the dust cap was not placed on the mouthpiece, check the mouthpiece for dirt or other objects. Be sure that the canister is fully and firmly inserted in the mouthpiece. 2. If you are using the inhaler for the first time or if you have not used the inhaler in more than 14 days, you will need to prime it. You may also need to prime the inhaler if it has been dropped. Ask your pharmacist or check the manufacturer's information if this happens. To prime the inhaler, shake it well and then press down on the canister 4 times to release 4 sprays into the air, away from your face. Be careful not to get albuterol in your eyes. 3. Shake the inhaler well. 4. Breathe out as completely as possible through your mouth. 4. Hold the canister with the mouthpiece on the bottom, facing you and the canister pointing upward. Place the open end of the mouthpiece into your mouth. Close your lips tightly around the mouthpiece. 6. Breathe in slowly and deeply through the mouthpiece.At the same time, press down once on the container to spray the medication into your mouth. 7. Try to hold your breath for 10 seconds. remove the inhaler, and breathe out  slowly. 8. If you were told to use 2 puffs, wait 1 minute and then repeat steps 3-7. 9. Replace the protective cap on the inhaler. 10. Clean your inhaler regularly. Follow the manufacturer's directions carefully and ask your doctor or pharmacist if you have any questions about cleaning your inhaler.  Check the back of the inhaler to keep track of the total number of doses left on the inhaler.

## 2021-07-23 NOTE — Progress Notes (Signed)
Toni Livingston    [id number]    12-Nov-1995  Primary Care Physician:Kuneff, Reinaldo Raddle, DO  Referring Physician: Ma Hillock, DO [address] [place],  [place] [id number] Reason for Consultation: shortness of breath Date of Consultation: 07/23/2021  Chief complaint:   Chief Complaint  Patient presents with   Consult    Pt has been having complaints of chest pain for about 6 months and also has been having SOB.     HPI:  Toni Livingston is a 26 y.o. woman with history of ankylosing spondylitis on humira who prseents for new patient evaluation of dyspnea. Symptoms started last summer with chest pain at her sternum. Worse with deep breathing, coughing, laughing or sneezing. The dyspnea is with minimal exertion such as walking from her office to the car.  She was given an albuterol inhaler by PCP and she has used it 3 times and thinks it "sort of" helps.   She has an occasional cough which is not daily related to a tickle in her throat. No mucus production.    No fevers, chills, night sweats.   Denies childhood respiratory disease, asthma, bronchitis.   Symptoms worsened with hot weather. She went hiking in the summer and had sudden onset dyspnea which felt like a  panic attack. Also worse with stress.  Has seasonal allergies now they she lives in [place].    Social history:  Occupation: She works as a Animal nutritionist at [employer].  Exposures: lives at home with husband. Two dogs and a cat and horses.  Smoking history: never smoker, husband used to smoke but not much around her.   Social History   Occupational History   Occupation: Consulting civil engineer  Tobacco Use   Smoking status: Never   Smokeless tobacco: Never  Vaping Use   Vaping Use: Never used  Substance and Sexual Activity   Alcohol use: Yes    Comment: occ   Drug use: No   Sexual activity: Yes    Partners: Male    Birth control/protection: I.U.D.    Comment: 1st intercourse- 16, partners-  80, current partner - 3 yrs    Relevant family history:  Family History  Problem Relation Age of Onset   Hypertension Mother    Testicular cancer Father    Healthy Sister    Depression Sister    Anxiety disorder Sister    Healthy Brother    Healthy Brother    Emphysema Maternal Grandmother    Uterine cancer Maternal Aunt     Past Medical History:  Diagnosis Date   Ankylosing spondylitis ([place])    Closed fracture of fifth metacarpal bone 07/19/2019   Iritis    LGSIL of cervix of undetermined significance 11/14/2018   Vaginal high risk HPV DNA test positive 11/19/2018   Vertigo     Past Surgical History:  Procedure Laterality Date   BREAST REDUCTION SURGERY  2015   HAND SURGERY Left 07/24/2019     Physical Exam: Blood pressure 112/78, pulse 86, temperature 98.2 F (36.8 C), temperature source Oral, height 5\' 8"  (1.727 m), weight 223 lb 3.2 oz (101.2 kg), SpO2 99 %. Gen:      No acute distress ENT:  no nasal polyps, mucus membranes moist Lungs:    No increased respiratory effort, symmetric chest wall excursion, clear to auscultation bilaterally, no wheezes or crackles CV:         Regular rate and rhythm; no murmurs, rubs,  or gallops.  No pedal edema Abd:      + bowel sounds; soft, non-tender; no distension MSK: no acute synovitis of DIP or PIP joints, no mechanics hands. Chest pain is reproducible left parasternal Skin:      Warm and dry; no rashes Neuro: normal speech, no focal facial asymmetry Psych: alert and oriented x3, normal mood and affect   Data Reviewed/Medical Decision Making:  Independent interpretation of tests: Imaging:  Review of patient's CT Chest images in Dec 2022 revealed no acute process. The patient's images have been independently reviewed by me.    PFTs:  No flowsheet data found.  Labs:  Lab Results  Component Value Date   WBC 5.5 06/03/2021   HGB 13.8 06/03/2021   HCT 41.8 06/03/2021   MCV 81.0 06/03/2021   PLT 417 (H) 06/03/2021    Lab Results  Component Value Date   NA 140 06/03/2021   K 4.3 06/03/2021   CL 105 06/03/2021   CO2 28 06/03/2021     Immunization status:  Immunization History  Administered Date(s) Administered   Influenza,inj,Quad PF,6+ Mos 03/09/2017, 03/05/2018, 02/27/2019, 06/04/2021   Influenza-Unspecified 03/20/2016   PFIZER(Purple Top)SARS-COV-2 Vaccination 03/23/2020, 04/13/2020, 08/07/2020   PPD Test 07/27/2016   Tdap 07/27/2016     I reviewed prior external note(s) from GI, PCP, Rheum,   I reviewed the result(s) of the labs and imaging as noted above.   I have ordered PFT   Assessment:  Shortness of breath Chest pain, reproducible. Ankylosing Spondylitis on humira  Plan/Recommendations: Toni Livingston has symptoms of chest pain and dyspnea, not necessarily related.  They are both generally with exertion and triggered by stress. Consideration includes asthma.  Will proceed with full set of PFTs and FENO. CT Chest is clear so do not think this is related to ILD from her autoimmune disease.  Continue albuterol prn and before exercise to see if this improves symptoms.  I will see her back after testing.  I suspect a MSK etiology for her chest pain which was reproducible on exam.  We discussed disease management and progression at length today.    Return to Care: Return in about 2 weeks (around 08/06/2021).  Lenice Llamas, MD [facility] [place]  CC: Raoul Pitch, Renee A, DO

## 2021-07-27 NOTE — Progress Notes (Signed)
Office Visit Note  Patient: Toni Livingston             Date of Birth: [birth year]           MRN: [id number]             PCP: Ma Hillock, DO Referring: Ma Hillock, DO Visit Date: 08/10/2021 Occupation: @GUAROCC @  Subjective:  Pain in both SI joints   History of Present Illness: Toni Livingston is a 26 y.o. female with history of ankylosing spondylitis.  She is on humira 40 mg sq injections every 14 days.  She has not missed any doses of Humira recently.  She states that 3 weeks ago she was performing yard work and has had increased discomfort in her SI joints since then.  She has had some nocturnal pain in her lower back and states her back feels tight.  Her symptoms are alleviated by taking Eksir strength Tylenol over-the-counter.  She is also been using a heating pad.  She denies any symptoms of sciatica currently.  She denies any other joint pain or joint swelling.  She has not had any Achilles tendinitis or plantar fasciitis. She denies any flares in her eyes recently.  She has been seeing her ophthalmologist every 6 months. She states that she is scheduled for a colonoscopy on Monday.  She also is scheduled for a follow-up visit with her pulmonologist Dr. Shearon Stalls and will have PFTs performed tomorrow. She has not had any recent infections.    Activities of Daily Living:  Patient reports morning stiffness for 2-3 hours.   Patient Reports nocturnal pain.  Difficulty dressing/grooming: Reports Difficulty climbing stairs: Denies Difficulty getting out of chair: Reports Difficulty using hands for taps, buttons, cutlery, and/or writing: Denies  Review of Systems  Constitutional:  Positive for fatigue.  HENT:  Negative for mouth sores, mouth dryness and nose dryness.   Eyes:  Positive for pain. Negative for itching and dryness.  Respiratory:  Positive for shortness of breath. Negative for difficulty breathing.   Cardiovascular:  Negative for palpitations  and swelling in legs/feet.  Gastrointestinal:  Negative for blood in stool, constipation and diarrhea.  Endocrine: Negative for increased urination.  Genitourinary:  Negative for difficulty urinating.  Musculoskeletal:  Positive for joint pain, joint pain and morning stiffness. Negative for joint swelling, myalgias, muscle tenderness and myalgias.  Skin:  Positive for rash. Negative for color change.  Allergic/Immunologic: Positive for susceptible to infections.  Neurological:  Negative for dizziness, numbness, headaches, memory loss and weakness.  Hematological:  Positive for bruising/bleeding tendency.  Psychiatric/Behavioral:  Negative for confusion.    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 07/28/2020   IUD (intrauterine device) in place-Paraguard 04/2020 05/22/2020   HLA B27 (HLA B27 positive) 04/16/2019   ANA positive 04/16/2019   Iritis 04/10/2019   DDD (degenerative disc disease), lumbar 04/10/2019   Obesity (BMI 30-39.9) 11/14/2018   Pilonidal cyst 11/14/2018   Oral contraceptive use 07/27/2016   Encounter for long-term current use of medication 07/27/2016    Past Medical History:  Diagnosis Date   Ankylosing spondylitis ([place])    Closed fracture of fifth metacarpal bone 07/19/2019   Iritis    LGSIL of cervix of undetermined significance 11/14/2018   Vaginal high risk  HPV DNA test positive 11/19/2018   Vertigo     Family History  Problem Relation Age of Onset   Hypertension Mother    Testicular cancer Father    Healthy Sister    Depression Sister    Anxiety disorder Sister    Healthy Brother    Healthy Brother    Uterine cancer Maternal Aunt    Emphysema Maternal Grandmother    Breast cancer Maternal Grandmother    Past  Surgical History:  Procedure Laterality Date   BREAST REDUCTION SURGERY  2015   HAND SURGERY Left 07/24/2019   Social History   Social History Narrative   Single. Some college.    Works as Therapist, nutritional.    Drinks caffeine.    Wears seatbelt. Smoke detector in the home.    Exercises routinely.    Feels safe in relationships.       Immunization History  Administered Date(s) Administered   Influenza,inj,Quad PF,6+ Mos 03/09/2017, 03/05/2018, 02/27/2019, 06/04/2021   Influenza-Unspecified 03/20/2016   PFIZER(Purple Top)SARS-COV-2 Vaccination 03/23/2020, 04/13/2020, 08/07/2020   PPD Test 07/27/2016   Tdap 07/27/2016     Objective: Vital Signs: BP 102/70 (BP Location: Left Arm, Patient Position: Sitting, Cuff Size: Large)    Pulse 80    Ht 5' 8"  (1.727 m)    Wt 223 lb 9.6 oz (101.4 kg)    BMI 34.00 kg/m    Physical Exam Vitals and nursing note reviewed.  Constitutional:      Appearance: She is well-developed.  HENT:     Head: Normocephalic and atraumatic.  Eyes:     Conjunctiva/sclera: Conjunctivae normal.     Comments: No conjunctival injection noted  Pulmonary:     Effort: Pulmonary effort is normal.  Abdominal:     General: Bowel sounds are normal.     Palpations: Abdomen is soft.  Musculoskeletal:     Cervical back: Normal range of motion.  Skin:    General: Skin is warm and dry.     Capillary Refill: Capillary refill takes less than 2 seconds.  Neurological:     Mental Status: She is alert and oriented to person, place, and time.  Psychiatric:        Behavior: Behavior normal.     Musculoskeletal Exam: C-spine, thoracic spine, and lumbar  spine have good ROM.  No midline spinal tenderness.  Tenderness over both SI joints.  shoulder joints, elbow joints, wrist joints, MCPs, PIPs, DIPs have good range of motion with no synovitis.  Complete fist formation bilaterally.  Hip joints have good range of motion with some discomfort in the right hip.  Knee joints  have good range of motion with no warmth or effusion.  Ankle joints have good range of motion with no tenderness or joint swelling.  No evidence of Achilles tendinitis or plantar fasciitis.  CDAI Exam: CDAI Score: -- Patient Global: --; Provider Global: -- Swollen: --; Tender: -- Joint Exam 08/10/2021   No joint exam has been documented for this visit   There is currently no information documented on the homunculus. Go to the Rheumatology activity and complete the homunculus joint exam.  Investigation: No additional findings.  Imaging: No results found.  Recent Labs: Lab Results  Component Value Date   WBC 5.5 06/03/2021   HGB 13.8 06/03/2021   PLT 417 (H) 06/03/2021   NA 140 06/03/2021   K 4.3 06/03/2021   CL 105 06/03/2021   CO2 28 06/03/2021   GLUCOSE 95 06/03/2021   BUN  9 06/03/2021   CREATININE 0.87 06/03/2021   BILITOT 0.3 06/03/2021   ALKPHOS 109 12/25/2019   AST 19 06/03/2021   ALT 15 06/03/2021   PROT 7.3 06/03/2021   ALBUMIN 4.2 12/25/2019   CALCIUM 9.6 06/03/2021   GFRAA 141 10/06/2020   QFTBGOLDPLUS NEGATIVE 03/09/2021    Speciality Comments: No specialty comments available.  Procedures:  No procedures performed Allergies: Patient has no known allergies.    Assessment / Plan:     Visit Diagnoses: Ankylosing spondylitis of sacral region [facility]) - History of chronic SI joint pain, SI joint sclerosis on the x-rays, HLA-B27 positive. SI joints injected on 05/27/2020: She presents today with increased pain in both SI joints which started 3 weeks ago after performing yard work.  She has been taking extra strength Tylenol and using a heating pad as needed for symptomatic relief.  She remains on Humira 40 mg sq injections every 14 days.  She has not missed any doses of Humira recently.  She has no synovitis or dactylitis on examination today.  No evidence of Achilles tendinitis or plantar fasciitis.  Prior to performing yard work about 3 weeks ago she had increased  her activity level and was stretching on a daily basis without difficulty.  Overall she has found Humira to be effective at managing her symptoms.  Of note her and her husband are planning pregnancy sometime this year and she is considering removing her IUD in the late spring/early summer.  Discussed that Humira does not currently have indication for ankylosing spondylitis during pregnancy and while breast-feeding.  Discussed that prior to pregnancy I would recommend switching to Cimzia.  She will follow-up in 3 months prior to removing her IUD to further discuss switching to Cimzia. She is scheduled for colonoscopy with Dr. Ardis Hughs on 08/16/2021.  Discussed the importance of avoiding prednisone use prior to her colonoscopy in order to prevent masking any signs of IBD as well as to avoid the increased risk for GI perforation.  She voiced understanding.  She was advised to have any biopsy results forwarded to our office to review. She will remain on Humira as prescribed.  She will follow-up in the office in 3 months as discussed above.  High risk medication use - Humira 40 mg sq injections every 14 days. CBC and CMP drawn on 06/03/2021.  She will be due to update lab work in March and every 3 months to monitor for drug toxicity.  Standing orders for CBC and CMP remain in place.  TB Gold negative on 03/09/2021. She has not had any recent infections.  Discussed the importance of holding Humira if she develops signs or symptoms of infection and to resume once the infection is completely cleared. Of note she is considering removing her IUD in the late spring/early summer.  Discussed that Humira is not approved during pregnancy and while breast-feeding.  She will follow-up in the office in about 3 months at which time we will discuss switching from Humira to Cimzia which will be safe during pregnancy and while breast-feeding.  She voiced understanding.  She was advised to avoid taking out her IUD until after we have  discussed switching to Cimzia at her follow-up visit.  Iridocyclitis - Followed by Dr. Charlcie Cradle months.  No conjunctival injection was noted on examination today.  She will remain on Humira as prescribed.  HLA B27 positive  Chronic SI joint pain: She presents today with increased pain in both SI joints for the past 3 weeks.  Her symptoms were triggered by performing yard work and have gradually been improving.  She has been taking extra strength Tylenol intact for symptomatic relief.  Prior to this flare she had increased her activity level and was stretching on a regular basis. Discussed that I recommend avoiding the use of prednisone at this time due to her being scheduled for pulmonary function testing tomorrow as well as a colonoscopy on Monday. She was advised to notify us if her symptoms persist or worsen.  In the meantime she can continue to use a heating pad, exercise Tylenol, as well as lidocaine patches for symptomatic relief.    Shortness of breath: She has been experiencing chest pain and shortness of breath intermittently for the past 6 months.  Chest CT ordered on 06/11/2021 was normal.  She has establish care with Dr. Shearon Stalls.  She is scheduled for an appointment tomorrow and will be having PFTs performed at that time.  ANA positive - She has no clinical features of systemic lupus.   Other medical conditions are listed as follows:  Pilonidal cyst  Vaginal high risk HPV DNA test positive  Orders: No orders of the defined types were placed in this encounter.  No orders of the defined types were placed in this encounter.     Follow-Up Instructions: Return in about 3 months (around 11/07/2021) for Ankylosing Spondylitis.   Ofilia Neas, PA-C  Note - This record has been created using Dragon software.  Chart creation errors have been sought, but may not always  have been located. Such creation errors do not reflect on  the standard of medical care.

## 2021-08-09 ENCOUNTER — Encounter: Payer: Self-pay | Admitting: Gastroenterology

## 2021-08-10 ENCOUNTER — Ambulatory Visit (INDEPENDENT_AMBULATORY_CARE_PROVIDER_SITE_OTHER): Payer: Federal, State, Local not specified - PPO | Admitting: Physician Assistant

## 2021-08-10 ENCOUNTER — Other Ambulatory Visit: Payer: Self-pay

## 2021-08-10 ENCOUNTER — Encounter: Payer: Self-pay | Admitting: Physician Assistant

## 2021-08-10 VITALS — BP 102/70 | HR 80 | Ht 68.0 in | Wt 223.6 lb

## 2021-08-10 DIAGNOSIS — R768 Other specified abnormal immunological findings in serum: Secondary | ICD-10-CM

## 2021-08-10 DIAGNOSIS — M533 Sacrococcygeal disorders, not elsewhere classified: Secondary | ICD-10-CM

## 2021-08-10 DIAGNOSIS — Z79899 Other long term (current) drug therapy: Secondary | ICD-10-CM

## 2021-08-10 DIAGNOSIS — R87811 Vaginal high risk human papillomavirus (HPV) DNA test positive: Secondary | ICD-10-CM

## 2021-08-10 DIAGNOSIS — G8929 Other chronic pain: Secondary | ICD-10-CM

## 2021-08-10 DIAGNOSIS — H209 Unspecified iridocyclitis: Secondary | ICD-10-CM | POA: Diagnosis not present

## 2021-08-10 DIAGNOSIS — Z1589 Genetic susceptibility to other disease: Secondary | ICD-10-CM | POA: Diagnosis not present

## 2021-08-10 DIAGNOSIS — L0591 Pilonidal cyst without abscess: Secondary | ICD-10-CM

## 2021-08-10 DIAGNOSIS — R0602 Shortness of breath: Secondary | ICD-10-CM

## 2021-08-10 DIAGNOSIS — M458 Ankylosing spondylitis sacral and sacrococcygeal region: Secondary | ICD-10-CM | POA: Diagnosis not present

## 2021-08-10 NOTE — Patient Instructions (Signed)
Standing Labs We placed an order today for your standing lab work.   Please have your standing labs drawn in March and every 3 months   If possible, please have your labs drawn 2 weeks prior to your appointment so that the provider can discuss your results at your appointment.  Please note that you may see your imaging and lab results in [place] before we have reviewed them. We may be awaiting multiple results to interpret others before contacting you. Please allow our office up to 72 hours to thoroughly review all of the results before contacting the office for clarification of your results.  We have open lab daily: Monday through Thursday from 1:30-4:30 PM and Friday from 1:30-4:00 PM at the office of Dr. Bo Merino, [facility].   Please be advised, all patients with office appointments requiring lab work will take precedent over walk-in lab work.  If possible, please come for your lab work on Monday and Friday afternoons, as you may experience shorter wait times. The office is located at [address] [place], [place],  [address] No appointment is necessary.   Labs are drawn by [facility]. Please bring your co-pay at the time of your lab draw.  You may receive a bill from [facility] for your lab work.  Please note if you are on Hydroxychloroquine and and an order has been placed for a Hydroxychloroquine level, you will need to have it drawn 4 hours or more after your last dose.  If you wish to have your labs drawn at another location, please call the office 24 hours in advance to send orders.  If you have any questions regarding directions or hours of operation,  please call [phone].   As a reminder, please drink plenty of water prior to coming for your lab work. Thanks!

## 2021-08-11 ENCOUNTER — Ambulatory Visit (INDEPENDENT_AMBULATORY_CARE_PROVIDER_SITE_OTHER): Payer: Federal, State, Local not specified - PPO | Admitting: Internal Medicine

## 2021-08-11 ENCOUNTER — Encounter: Payer: Self-pay | Admitting: Internal Medicine

## 2021-08-11 VITALS — BP 120/70 | HR 89 | Temp 98.5°F | Ht 69.0 in | Wt 226.8 lb

## 2021-08-11 DIAGNOSIS — R0602 Shortness of breath: Secondary | ICD-10-CM | POA: Diagnosis not present

## 2021-08-11 DIAGNOSIS — M458 Ankylosing spondylitis sacral and sacrococcygeal region: Secondary | ICD-10-CM

## 2021-08-11 NOTE — Progress Notes (Signed)
PFT done today. 

## 2021-08-11 NOTE — Progress Notes (Signed)
Toni Livingston    [id number]    1995-12-28  [facility], Toni Raddle, DO Date of Appointment: 08/11/2021 Established Patient Visit  Chief complaint:   Chief Complaint  Patient presents with   Follow-up    No concerns.  Here to get PFT results.     HPI: Toni Livingston is a 26 y.o. woman with ankylosing spondylitis and symptoms of dyspnea.   Interval Updates: Here for follow up after PFTs and a trial of prn albuterol. Normal pulmonary function She did not have to use albuterol inhaler since last time I saw her. Chest pain has resolved. AS is acting up right now which is limiting mobility.   I have reviewed the patient's family social and past medical history and updated as appropriate.   Past Medical History:  Diagnosis Date   Ankylosing spondylitis ([place])    Closed fracture of fifth metacarpal bone 07/19/2019   Iritis    LGSIL of cervix of undetermined significance 11/14/2018   Vaginal high risk HPV DNA test positive 11/19/2018   Vertigo     Past Surgical History:  Procedure Laterality Date   BREAST REDUCTION SURGERY  2015   HAND SURGERY Left 07/24/2019    Family History  Problem Relation Age of Onset   Hypertension Mother    Testicular cancer Father    Healthy Sister    Depression Sister    Anxiety disorder Sister    Healthy Brother    Healthy Brother    Uterine cancer Maternal Aunt    Emphysema Maternal Grandmother    Breast cancer Maternal Grandmother     Social History   Occupational History   Occupation: Consulting civil engineer  Tobacco Use   Smoking status: Never    Passive exposure: Past   Smokeless tobacco: Never  Vaping Use   Vaping Use: Never used  Substance and Sexual Activity   Alcohol use: Yes    Comment: occ   Drug use: No   Sexual activity: Yes    Partners: Male    Birth control/protection: I.U.D.    Comment: 1st intercourse- 16, partners- 67, current partner - 3 yrs     Physical  Exam: Blood pressure 120/70, pulse 89, temperature 98.5 F (36.9 C), temperature source Oral, height 5\' 9"  (1.753 m), weight 226 lb 12.8 oz (102.9 kg), SpO2 99 %.  Gen:      No acute distress ENT:  no nasal polyps, mucus membranes moist Lungs:    No increased respiratory effort, symmetric chest wall excursion, clear to auscultation bilaterally, no wheezes or crackles CV:         Regular rate and rhythm; no murmurs, rubs, or gallops.  No pedal edema   Data Reviewed: Imaging: I have personally reviewed the CT Chest from Dec 2022 - no acute pulmonary process, no evidence of ILD  PFTs: No flowsheet data found. I have personally reviewed the patient's PFTs and they show normal pulmonary function.   Labs:  Immunization status: Immunization History  Administered Date(s) Administered   Influenza,inj,Quad PF,6+ Mos 03/09/2017, 03/05/2018, 02/27/2019, 06/04/2021   Influenza-Unspecified 03/20/2016   PFIZER(Purple Top)SARS-COV-2 Vaccination 03/23/2020, 04/13/2020, 08/07/2020   PPD Test 07/27/2016   Tdap 07/27/2016    External Records Personally Reviewed: pcp  Assessment:  Shortness of breath Ankylosing Spondylitis  Plan/Recommendations: Normal pulmonary function. I think it's very unlikely her current symptoms are related to any kind of primary lung disease.  I am happy to see her back  as needed if symptoms return. We can also proceed with cardiac evaluation or exercise testing.   She will let me know if she uses albuterol and it helps, I explained that her normal PFTs do not exclude an asthma diagnosis.   Return to Care: PRN.   Lenice Llamas, MD [facility] [place]

## 2021-08-11 NOTE — Patient Instructions (Signed)
Follow up with me as needed, or if breathing issues worsen.  Lung function completely normal today, yay!

## 2021-08-16 ENCOUNTER — Ambulatory Visit (AMBULATORY_SURGERY_CENTER): Payer: Federal, State, Local not specified - PPO | Admitting: Gastroenterology

## 2021-08-16 ENCOUNTER — Telehealth: Payer: Self-pay

## 2021-08-16 ENCOUNTER — Encounter: Payer: Self-pay | Admitting: Gastroenterology

## 2021-08-16 VITALS — BP 113/74 | HR 68 | Temp 98.6°F | Resp 11 | Ht 68.0 in | Wt 225.0 lb

## 2021-08-16 DIAGNOSIS — K5289 Other specified noninfective gastroenteritis and colitis: Secondary | ICD-10-CM | POA: Diagnosis not present

## 2021-08-16 DIAGNOSIS — R194 Change in bowel habit: Secondary | ICD-10-CM | POA: Diagnosis not present

## 2021-08-16 DIAGNOSIS — R7989 Other specified abnormal findings of blood chemistry: Secondary | ICD-10-CM

## 2021-08-16 DIAGNOSIS — K5900 Constipation, unspecified: Secondary | ICD-10-CM

## 2021-08-16 DIAGNOSIS — K5000 Crohn's disease of small intestine without complications: Secondary | ICD-10-CM | POA: Diagnosis not present

## 2021-08-16 DIAGNOSIS — R197 Diarrhea, unspecified: Secondary | ICD-10-CM

## 2021-08-16 DIAGNOSIS — K529 Noninfective gastroenteritis and colitis, unspecified: Secondary | ICD-10-CM

## 2021-08-16 DIAGNOSIS — R1013 Epigastric pain: Secondary | ICD-10-CM

## 2021-08-16 DIAGNOSIS — R768 Other specified abnormal immunological findings in serum: Secondary | ICD-10-CM

## 2021-08-16 LAB — PULMONARY FUNCTION TEST
DL/VA % pred: 112 %
DL/VA: 5.06 ml/min/mmHg/L
DLCO cor % pred: 103 %
DLCO cor: 27.43 ml/min/mmHg
DLCO unc % pred: 103 %
DLCO unc: 27.43 ml/min/mmHg
FEF 25-75 Post: 5.64 L/sec
FEF 25-75 Pre: 5.22 L/sec
FEF2575-%Change-Post: 7 %
FEF2575-%Pred-Post: 145 %
FEF2575-%Pred-Pre: 134 %
FEV1-%Change-Post: 1 %
FEV1-%Pred-Post: 107 %
FEV1-%Pred-Pre: 105 %
FEV1-Post: 4.01 L
FEV1-Pre: 3.96 L
FEV1FVC-%Change-Post: 2 %
FEV1FVC-%Pred-Pre: 103 %
FEV6-%Change-Post: -1 %
FEV6-%Pred-Post: 100 %
FEV6-%Pred-Pre: 102 %
FEV6-Post: 4.42 L
FEV6-Pre: 4.49 L
FEV6FVC-%Pred-Post: 100 %
FEV6FVC-%Pred-Pre: 100 %
FVC-%Change-Post: -1 %
FVC-%Pred-Post: 100 %
FVC-%Pred-Pre: 101 %
FVC-Post: 4.42 L
FVC-Pre: 4.49 L
Post FEV1/FVC ratio: 91 %
Post FEV6/FVC ratio: 100 %
Pre FEV1/FVC ratio: 88 %
Pre FEV6/FVC Ratio: 100 %
RV % pred: 79 %
RV: 1.24 L
TLC % pred: 98 %
TLC: 5.73 L

## 2021-08-16 MED ORDER — SODIUM CHLORIDE 0.9 % IV SOLN: 500.0000 mL | Freq: Once | INTRAVENOUS | Status: DC

## 2021-08-16 NOTE — Progress Notes (Signed)
HPI: This is a woman with cghange in bowel habits (alternating)   ROS: complete GI ROS as described in HPI, all other review negative.  Constitutional:  No unintentional weight loss   Past Medical History:  Diagnosis Date   Ankylosing spondylitis ([place])    Closed fracture of fifth metacarpal bone 07/19/2019   Iritis    LGSIL of cervix of undetermined significance 11/14/2018   Vaginal high risk HPV DNA test positive 11/19/2018   Vertigo     Past Surgical History:  Procedure Laterality Date   BREAST REDUCTION SURGERY  2015   HAND SURGERY Left 07/24/2019    Current Outpatient Medications  Medication Sig Dispense Refill   Difluprednate (DUREZOL OP) Apply to eye as needed.     dorzolamide-timolol (COSOPT) 22.3-6.8 MG/ML ophthalmic solution SMARTSIG:In Eye(s)     Olopatadine HCl (PATADAY OP) Apply to eye as needed.     Adalimumab (HUMIRA PEN) 40 MG/0.4ML PNKT INJECT 40MG  SUBCUTANEOUSLY  EVERY 2 WEEKS 2 each 2   Albuterol Sulfate (PROAIR RESPICLICK) 144 (90 Base) MCG/ACT AEPB 2 puffs 20 minutes prior to exercise activity 1 each 0   Current Facility-Administered Medications  Medication Dose Route Frequency Provider Last Rate Last Admin   0.9 %  sodium chloride infusion  500 mL Intravenous Once Milus Banister, MD        Allergies as of 08/16/2021   (No Known Allergies)    Family History  Problem Relation Age of Onset   Hypertension Mother    Colon polyps Father    Testicular cancer Father    Healthy Sister    Depression Sister    Anxiety disorder Sister    Healthy Brother    Healthy Brother    Uterine cancer Maternal Aunt    Diabetes Maternal Grandmother    Emphysema Maternal Grandmother    Breast cancer Maternal Grandmother    Diabetes Maternal Grandfather    Colon cancer Neg Hx    Esophageal cancer Neg Hx     Social History   Socioeconomic History   Marital status: Married    Spouse name: Not on file   Number of children: 0   Years of education: 13   Highest  education level: Not on file  Occupational History   Occupation: Consulting civil engineer  Tobacco Use   Smoking status: Never    Passive exposure: Past   Smokeless tobacco: Never  Vaping Use   Vaping Use: Never used  Substance and Sexual Activity   Alcohol use: Yes    Comment: occ   Drug use: No   Sexual activity: Yes    Partners: Male    Birth control/protection: I.U.D.    Comment: 1st intercourse- 16, partners- 66, current partner - 3 yrs  Other Topics Concern   Not on file  Social History Narrative   Single. Some college.    Works as Therapist, nutritional.    Drinks caffeine.    Wears seatbelt. Smoke detector in the home.    Exercises routinely.    Feels safe in relationships.       Social Determinants of Health   Financial Resource Strain: Not on file  Food Insecurity: Not on file  Transportation Needs: Not on file  Physical Activity: Not on file  Stress: Not on file  Social Connections: Not on file  Intimate Partner Violence: Not on file     Physical Exam: BP 115/68 (BP Location: Right Arm, Patient Position: Sitting, Cuff Size: Normal)    Pulse 90  Temp 98.6 F (37 C) (Temporal)    Ht 5\' 8"  (1.727 m)    Wt 225 lb (102.1 kg)    SpO2 98%    BMI 34.21 kg/m  Constitutional: generally well-appearing Psychiatric: alert and oriented x3 Lungs: CTA bilaterally Heart: no MCR  Assessment and plan: 26 y.o. female with change in bowel habits  Colonoscopy today  Care is appropriate for the ambulatory setting.  Owens Loffler, MD [facility] 08/16/2021, 11:03 AM

## 2021-08-16 NOTE — Op Note (Signed)
[facility] Patient Name: Toni Livingston Procedure Date: 08/16/2021 11:00 AM MRN: [id number] Endoscopist: Milus Banister , MD Age: 26 Referring MD:  Date of Birth: May 12, 1996 Gender: Female Account #: [id number] Procedure:                Colonoscopy Indications:              Change in bowel habits, alternating                            diarrhea/urgency with mild constipation. Has                            ankylosing spondylitis, on humira 40mg  q 2 weeks Medicines:                Monitored Anesthesia Care Procedure:                Pre-Anesthesia Assessment:                           - Prior to the procedure, a History and Physical                            was performed, and patient medications and                            allergies were reviewed. The patient's tolerance of                            previous anesthesia was also reviewed. The risks                            and benefits of the procedure and the sedation                            options and risks were discussed with the patient.                            All questions were answered, and informed consent                            was obtained. Prior Anticoagulants: The patient has                            taken no previous anticoagulant or antiplatelet                            agents. ASA Grade Assessment: II - A patient with                            mild systemic disease. After reviewing the risks                            and benefits, the patient was deemed in  satisfactory condition to undergo the procedure.                           After obtaining informed consent, the colonoscope                            was passed under direct vision. Throughout the                            procedure, the patient's blood pressure, pulse, and                            oxygen saturations were monitored continuously. The                            CF [id number] #[id number] was  introduced through the anus                            and advanced to the the terminal ileum. The                            colonoscopy was performed without difficulty. The                            patient tolerated the procedure well. The quality                            of the bowel preparation was good. The terminal                            ileum, ileocecal valve, appendiceal orifice, and                            rectum were photographed. Scope In: 11:11:11 AM Scope Out: 11:23:09 AM Scope Withdrawal Time: 0 hours 9 minutes 39 seconds  Total Procedure Duration: 0 hours 11 minutes 58 seconds  Findings:                 The terminal ileum was severly inflammed and                            stenotic. I was unable to advance the adult                            colonoscope through the stenosis, estimate the lume                            was 4-55mma across. I biopsied the inflammation. jar                            The colon mucosa was normal throughout. I biopsied                            the right colon (jar 2) and left  colon (jar 3) to                            check for micrscopic changes.                           The exam was otherwise without abnormality on                            direct and retroflexion views. Complications:            No immediate complications. Estimated blood loss:                            None. Estimated Blood Loss:     Estimated blood loss: none. Impression:               - Inflammed and strictured terminal ileum,                            otherwise the examination was normal.                           - Biopsies taken from terminal ileum, right and                            left colon.                           - The examination was otherwise normal on direct                            and retroflexion views. Recommendation:           - Patient has a contact number available for                            emergencies. The signs and  symptoms of potential                            delayed complications were discussed with the                            patient. Return to normal activities tomorrow.                            Written discharge instructions were provided to the                            patient.                           - Resume previous diet.                           - Continue present medications.                           -  Await pathology results.                           - My office will arrange MR enterography to check                            for inflammation elsewhere in your small intestine.                           - My office will also arrange humira drug testing                            (antibodies and circulating drug level) one day                            prior to you next humira dosing. Milus Banister, MD 08/16/2021 11:33:44 AM This report has been signed electronically.

## 2021-08-16 NOTE — Progress Notes (Signed)
To Pacu, VSS. Report to [email or web address] 

## 2021-08-16 NOTE — Telephone Encounter (Signed)
The order for labs and MR entero have been entered.  The schedulers will call the pt with MR appt.  I have sent the information to the pt via My Chart.

## 2021-08-16 NOTE — Telephone Encounter (Signed)
-----   Message from Milus Banister, MD sent at 08/16/2021 11:45 AM EST ----- I just did a colonscopy for her in [place] today.  Her next humira dose is this Thursday. She needs humira antibodies and humira drug level checked on Wednesday.    Also MR enterography, see the colo report.  Thanks

## 2021-08-16 NOTE — Patient Instructions (Signed)
My office will arrange MR enterography to check for inflammation elsewhere in your small intestine.  My office will also arrange Humira drug testing (antibodies and circulating drug level) on day prior to your next Humira dosing.   YOU HAD AN ENDOSCOPIC PROCEDURE TODAY AT [facility]:   Refer to the procedure report that was given to you for any specific questions about what was found during the examination.  If the procedure report does not answer your questions, please call your gastroenterologist to clarify.  If you requested that your care partner not be given the details of your procedure findings, then the procedure report has been included in a sealed envelope for you to review at your convenience later.  YOU SHOULD EXPECT: Some feelings of bloating in the abdomen. Passage of more gas than usual.  Walking can help get rid of the air that was put into your GI tract during the procedure and reduce the bloating. If you had a lower endoscopy (such as a colonoscopy or flexible sigmoidoscopy) you may notice spotting of blood in your stool or on the toilet paper. If you underwent a bowel prep for your procedure, you may not have a normal bowel movement for a few days.  Please Note:  You might notice some irritation and congestion in your nose or some drainage.  This is from the oxygen used during your procedure.  There is no need for concern and it should clear up in a day or so.  SYMPTOMS TO REPORT IMMEDIATELY:  Following lower endoscopy (colonoscopy or flexible sigmoidoscopy):  Excessive amounts of blood in the stool  Significant tenderness or worsening of abdominal pains  Swelling of the abdomen that is new, acute  Fever of 100F or higher  For urgent or emergent issues, a gastroenterologist can be reached at any hour by calling [phone]. Do not use MyChart messaging for urgent concerns.    DIET:  We do recommend a small meal at first, but then you may proceed to your  regular diet.  Drink plenty of fluids but you should avoid alcoholic beverages for 24 hours.  ACTIVITY:  You should plan to take it easy for the rest of today and you should NOT DRIVE or use heavy machinery until tomorrow (because of the sedation medicines used during the test).    FOLLOW UP: Our staff will call the number listed on your records 48-72 hours following your procedure to check on you and address any questions or concerns that you may have regarding the information given to you following your procedure. If we do not reach you, we will leave a message.  We will attempt to reach you two times.  During this call, we will ask if you have developed any symptoms of COVID 19. If you develop any symptoms (ie: fever, flu-like symptoms, shortness of breath, cough etc.) before then, please call [phone].  If you test positive for Covid 19 in the 2 weeks post procedure, please call and report this information to [place].    If any biopsies were taken you will be contacted by phone or by letter within the next 1-3 weeks.  Please call us at [phone] if you have not heard about the biopsies in 3 weeks.    SIGNATURES/CONFIDENTIALITY: You and/or your care partner have signed paperwork which will be entered into your electronic medical record.  These signatures attest to the fact that that the information above on your After Visit Summary has been reviewed  and is understood.  Full responsibility of the confidentiality of this discharge information lies with you and/or your care-partner.

## 2021-08-16 NOTE — Progress Notes (Signed)
Vitals-DT  Pt's states no medical or surgical changes since previsit or office visit.  

## 2021-08-16 NOTE — Progress Notes (Signed)
Called to room to assist during endoscopic procedure.  Patient ID and intended procedure confirmed with present staff. Received instructions for my participation in the procedure from the performing physician.  

## 2021-08-18 ENCOUNTER — Telehealth: Payer: Self-pay | Admitting: *Deleted

## 2021-08-18 ENCOUNTER — Other Ambulatory Visit: Payer: Federal, State, Local not specified - PPO

## 2021-08-18 DIAGNOSIS — R768 Other specified abnormal immunological findings in serum: Secondary | ICD-10-CM

## 2021-08-18 DIAGNOSIS — R1013 Epigastric pain: Secondary | ICD-10-CM

## 2021-08-18 DIAGNOSIS — R194 Change in bowel habit: Secondary | ICD-10-CM

## 2021-08-18 DIAGNOSIS — R7989 Other specified abnormal findings of blood chemistry: Secondary | ICD-10-CM

## 2021-08-18 DIAGNOSIS — R197 Diarrhea, unspecified: Secondary | ICD-10-CM

## 2021-08-18 NOTE — Telephone Encounter (Signed)
?  Follow up Call- ? ?Call back number 08/16/2021  ?Post procedure Call Back phone  # [phone]  ?Permission to leave phone message Yes  ?Some recent data might be hidden  ?  ? ?Patient questions: ? ?Do you have a fever, pain , or abdominal swelling? No. ?Pain Score  0 * ? ?Have you tolerated food without any problems? Yes.   ? ?Have you been able to return to your normal activities? Yes.   ? ?Do you have any questions about your discharge instructions: ?Diet   No. ?Medications  No. ?Follow up visit  No. ? ?Do you have questions or concerns about your Care? No. ? ?Actions: ?* If pain score is 4 or above: ?No action needed, pain <4. ? ? ?

## 2021-08-23 MED ORDER — PREDNISONE 5 MG PO TABS: ORAL_TABLET | ORAL | 0 refills | Status: DC

## 2021-08-23 NOTE — Telephone Encounter (Signed)
Spoke with patient and advised prescription being sent to the pharmacy for prednisone. Patient states to send to her local pharmacy.  ?

## 2021-08-23 NOTE — Telephone Encounter (Signed)
Ok to send in prednisone taper starting at 20 mg tapering by 5 mg every 4 days.  ? ? ?I reached out to Dr. Olena Heckle who is ok with [place] proceeding with a prednisone taper.

## 2021-08-27 ENCOUNTER — Encounter: Payer: Self-pay | Admitting: Gastroenterology

## 2021-08-27 ENCOUNTER — Telehealth: Payer: Self-pay

## 2021-08-27 ENCOUNTER — Other Ambulatory Visit: Payer: Self-pay

## 2021-08-27 DIAGNOSIS — K529 Noninfective gastroenteritis and colitis, unspecified: Secondary | ICD-10-CM

## 2021-08-27 NOTE — Telephone Encounter (Signed)
Please see patient's message reference MRI cost scheduled for Monday 08/30/21 and advise. Thanks. ?

## 2021-08-30 ENCOUNTER — Other Ambulatory Visit: Payer: Self-pay

## 2021-08-30 ENCOUNTER — Ambulatory Visit (HOSPITAL_COMMUNITY): Admission: RE | Admit: 2021-08-30 | Payer: Federal, State, Local not specified - PPO | Source: Ambulatory Visit

## 2021-08-30 NOTE — Progress Notes (Signed)
Incorrect information-retracted letter ?

## 2021-09-01 LAB — SERIAL MONITORING

## 2021-09-02 LAB — ADALIMUMAB+AB (SERIAL MONITOR)
Adalimumab Drug Level: <0.6 ug/mL
Anti-Adalimumab Antibody: 3820 ng/mL

## 2021-09-03 ENCOUNTER — Telehealth: Payer: Self-pay | Admitting: Pharmacist

## 2021-09-03 ENCOUNTER — Telehealth: Payer: Self-pay

## 2021-09-03 NOTE — Telephone Encounter (Signed)
-----   Message from Milus Banister, MD sent at 09/03/2021  7:25 AM EDT ----- ?I think Cimzia is a great option.  Would opt for the 400 mg every 4-week eventual dosage rather than the 200 mg every 2-week.   ? ?I discussed this all with her today.  She understands that she has developed significant antibodies to the Humira.  This probably explains her flares of ankylosing spondylitis and eye symptoms over the past few months as well.  Go ahead with the application process.  We will see her back here in our office and 3 to 4 months.  Thanks ? ? ?Toni Livingston, ?She needs follow-up office visit with me in 3 or 4 months. ? ? ?----- Message ----- ?From: Ofilia Neas, PA-C ?Sent: 09/02/2021   1:04 PM EDT ?To: Milus Banister, MD ? ?Hi Dr. Ardis Hughs,  ? ?I spoke with Dr. Estanislado Pandy and we both agree Toni Livingston would be a good option to cover for AS and Crohn's.  Thoughts? Once you have discussed results/next steps, we can schedule a sooner office visit with Chera to discuss cimzia and start the application process.  ? ? ?----- Message ----- ?From: Milus Banister, MD ?Sent: 09/02/2021   6:24 AM EDT ?To: Ofilia Neas, PA-C ? ?Toni Livingston, ?Her humira drug level has come back undetectable and her Humira Ab is very very elevated. It is pretty clear that she has developed Abs to the humira and it is not longer working for her.  ? ?What would your next line therapy be for her ank spond?  It very well may have activity against her Crohn's, terminal ileitis? ? ?I was going to contact her today or tomorrow with all this information. ? ? ?----- Message ----- ?From: Ofilia Neas, PA-C ?Sent: 08/18/2021   1:12 PM EDT ?To: Milus Banister, MD ? ?Hi Dr. Ardis Hughs,  ? ?Thank you for reaching out about our mutual patient.  I see the MR enterography is scheduled on 08/30/21 and the humira level is pending.   ?We will be more than happy to schedule a sooner visit with the patient to discuss treatment options pending results. Please let us know what you recommend  when the time comes.  ?Thanks,  ? ?Toni Livingston  ? ?----- Message ----- ?From: Milus Banister, MD ?Sent: 08/16/2021  11:37 AM EST ?To: Ofilia Neas, PA-C ? ? ?Hey, ?I think we share this patient with Ankylosing spondylitis.  Looks like she pretty significant terminal ileum inflmmation and stricture.  Likely overlap with crohn's disease.  I am getting MR enterography (check for other sites of small bowel inflammation) and also humira levels, antibodies the day prior to her next humira dosing.  May need medicine adjustment to cover the IBD and AS.  I'll let you know about all the test results as they return.  Thanks ? ?DJ ? ? ? ? ?- Inflammed and strictured terminal ileum, otherwise the examination was normal. ?- Biopsies taken from terminal ileum, right and left colon. ?- The examination was otherwise normal on direct and retroflexion views.  ? ? ? ? ? ?

## 2021-09-03 NOTE — Telephone Encounter (Signed)
Patient aware of Dr Ardis Hughs' recommendations regarding lab work and plan to follow up with Rheumatology.   ? ?Patient advised that she should follow up in our office in 3 to 4 months per Dr Ardis Hughs.  Reminder message sent to myself, and patient aware that I will contact her in late May to schedule an appointment.  Patient agreed to plan and verbalized understanding.  No further questions. ?

## 2021-09-03 NOTE — Telephone Encounter (Addendum)
.Please start Cimzia BIV. ? ?Dose: '400mg'$  at Weeks 0, 2, and 4, then '400mg'$  every 28 days thereafter ? ?Dx: Ankylosing spondylitis (M45.0) ? ?Previously tried therapies: ?Humira - developed antibodies and waning clinical response ? ?----- Message from Ofilia Neas, PA-C sent at 09/03/2021 11:40 AM EDT ----- ?Please start application process for cimzia. Please call patient to see if she has any additional questions or concerns.  ?Ok to obtain consent at new start visit.  ? ? ?----- Message ----- ?From: Milus Banister, MD ?Sent: 09/03/2021   7:27 AM EDT ?To: Ofilia Neas, PA-C, Stevan Born, CMA ? ?I think Cimzia is a great option.  Would opt for the 400 mg every 4-week eventual dosage rather than the 200 mg every 2-week.   ? ?I discussed this all with her today.  She understands that she has developed significant antibodies to the Humira.  This probably explains her flares of ankylosing spondylitis and eye symptoms over the past few months as well.  Go ahead with the application process.  We will see her back here in our office and 3 to 4 months.  Thanks ? ? ?Elmyra Ricks, ?She needs follow-up office visit with me in 3 or 4 months. ? ? ?----- Message ----- ?From: Ofilia Neas, PA-C ?Sent: 09/02/2021   1:04 PM EDT ?To: Milus Banister, MD ? ?Hi Dr. Ardis Hughs,  ? ?I spoke with Dr. Estanislado Pandy and we both agree Magnus Sinning would be a good option to cover for AS and Crohn's.  Thoughts? Once you have discussed results/next steps, we can schedule a sooner office visit with Candace to discuss cimzia and start the application process.  ? ? ?----- Message ----- ?From: Milus Banister, MD ?Sent: 09/02/2021   6:24 AM EDT ?To: Ofilia Neas, PA-C ? ?Lovena Le, ?Her humira drug level has come back undetectable and her Humira Ab is very very elevated. It is pretty clear that she has developed Abs to the humira and it is not longer working for her.  ? ?What would your next line therapy be for her ank spond?  It very well may have activity against  her Crohn's, terminal ileitis? ? ?I was going to contact her today or tomorrow with all this information. ? ? ?----- Message ----- ?From: Ofilia Neas, PA-C ?Sent: 08/18/2021   1:12 PM EDT ?To: Milus Banister, MD ? ?Hi Dr. Ardis Hughs,  ? ?Thank you for reaching out about our mutual patient.  I see the MR enterography is scheduled on 08/30/21 and the humira level is pending.   ?We will be more than happy to schedule a sooner visit with the patient to discuss treatment options pending results. Please let us know what you recommend when the time comes.  ?Thanks,  ? ?Lovena Le  ? ?----- Message ----- ?From: Milus Banister, MD ?Sent: 08/16/2021  11:37 AM EST ?To: Ofilia Neas, PA-C ? ? ?Hey, ?I think we share this patient with Ankylosing spondylitis.  Looks like she pretty significant terminal ileum inflmmation and stricture.  Likely overlap with crohn's disease.  I am getting MR enterography (check for other sites of small bowel inflammation) and also humira levels, antibodies the day prior to her next humira dosing.  May need medicine adjustment to cover the IBD and AS.  I'll let you know about all the test results as they return.  Thanks ? ?DJ ? ? ? ? ?- Inflammed and strictured terminal ileum, otherwise the examination was normal. ?- Biopsies  taken from terminal ileum, right and left colon. ?- The examination was otherwise normal on direct and retroflexion views.  ? ? ? ? ? ? ?

## 2021-09-06 ENCOUNTER — Ambulatory Visit (INDEPENDENT_AMBULATORY_CARE_PROVIDER_SITE_OTHER)
Admission: RE | Admit: 2021-09-06 | Discharge: 2021-09-06 | Disposition: A | Discharge status: Home / Self Care | Payer: Federal, State, Local not specified - PPO | Source: Ambulatory Visit | Attending: Gastroenterology | Admitting: Gastroenterology

## 2021-09-06 ENCOUNTER — Other Ambulatory Visit: Payer: Self-pay

## 2021-09-06 DIAGNOSIS — K529 Noninfective gastroenteritis and colitis, unspecified: Secondary | ICD-10-CM | POA: Diagnosis not present

## 2021-09-06 MED ORDER — IOHEXOL 300 MG/ML  SOLN
100.0000 mL | Freq: Once | INTRAMUSCULAR | Status: AC | PRN
  Administered 2021-09-06: 100 mL via INTRAVENOUS

## 2021-09-07 ENCOUNTER — Other Ambulatory Visit: Payer: Self-pay | Admitting: Rheumatology

## 2021-09-07 DIAGNOSIS — M458 Ankylosing spondylitis sacral and sacrococcygeal region: Secondary | ICD-10-CM

## 2021-09-07 NOTE — Telephone Encounter (Signed)
ATC patient to determine best way for her to complete Cimzia [employer] enrollment and BIV form. Left VM requesting return call. ? ?Provider form placed in Dr. Arlean Hopping folder to be signed with insurance card copy ? ?Knox Saliva, PharmD, MPH, BCPS ?Clinical Pharmacist (Rheumatology and Pulmonology) ?

## 2021-09-09 NOTE — Progress Notes (Signed)
Thank you :)

## 2021-09-09 NOTE — Telephone Encounter (Signed)
[employer] form emailed to patient ? ?Knox Saliva, PharmD, MPH, BCPS ?Clinical Pharmacist (Rheumatology and Pulmonology) ?

## 2021-09-09 NOTE — Telephone Encounter (Signed)
Signed patient form received. Faxed to [employer]. ? ?Fax: [phone] ?Phone: [phone] ? ?Knox Saliva, PharmD, MPH, BCPS ?Clinical Pharmacist (Rheumatology and Pulmonology) ?

## 2021-09-13 NOTE — Telephone Encounter (Addendum)
Prior authorization form completed and faxed to [place] [employer]. Notified case Freight forwarder via email ? ?Fax: [phone] ? ?Per [employer] verification of benefits - Cimzia is not covered under pharmacy benefit which is why prior authorization form was sent. ?Case # [id number] ? ?Cimzia case manager is Solon Augusta. ?[email or web address] ?[phone], opt 2, opt 1 then ext [phone] ?

## 2021-09-14 DIAGNOSIS — Z1152 Encounter for screening for COVID-19: Secondary | ICD-10-CM | POA: Diagnosis not present

## 2021-09-14 DIAGNOSIS — Z20822 Contact with and (suspected) exposure to covid-19: Secondary | ICD-10-CM | POA: Diagnosis not present

## 2021-09-14 DIAGNOSIS — H65191 Other acute nonsuppurative otitis media, right ear: Secondary | ICD-10-CM | POA: Diagnosis not present

## 2021-09-14 DIAGNOSIS — J029 Acute pharyngitis, unspecified: Secondary | ICD-10-CM | POA: Diagnosis not present

## 2021-09-14 NOTE — Telephone Encounter (Signed)
Received fax from [place] stating that patient's Cimzia is not covered by the benefit plan. ? ?Insurance rep unable to provide formulary alternative. She states patient must speak to prescriber. I advised that I am calling on behalf of presccriber for the latnerative but rep states she is unsure what the alternatives. After back and forth, rep advised me to complete formulary exception request form if Cimzia is what provider wants to pursue. ? ?Form completed and faxed to [facility] with clinics including OV notes, telephone note with Crohn's dx, Humira levels, and CRIB study ? ?Phone: [phone] ?Fax: [phone] ? ?Knox Saliva, PharmD, MPH, BCPS ?Clinical Pharmacist (Rheumatology and Pulmonology) ?

## 2021-09-15 NOTE — Telephone Encounter (Signed)
Received additional fax for prior authorization form to be completed for Cimzia which is different than prior approval form that was previously completed. Completed form and faxed to [facility] ? ?Fax: [phone] ?Phone: [phone] ? ?Knox Saliva, PharmD, MPH, BCPS ?Clinical Pharmacist (Rheumatology and Pulmonology) ?

## 2021-09-16 ENCOUNTER — Other Ambulatory Visit (HOSPITAL_COMMUNITY): Payer: Self-pay

## 2021-09-16 NOTE — Telephone Encounter (Signed)
Received notification from  [facility] [employer]  regarding a prior authorization for [facility]. Authorization has been APPROVED from 09/15/21 to 09/15/22.  ? ?Per test claim, copay for 28 days supply is $350 (for 2 x 2 PFS kits). Copay for 3 x 2 PFS kit ( starter kit) is $1050 ? ?Patient must fill through [facility]: [phone] ? ?Authorization # [id number] ?Phone # [phone] ? ?Uploaded into [employer] portal for case manager to assist in enrolling patient into copay assistance. ? ?Knox Saliva, PharmD, MPH, BCPS ?Clinical Pharmacist (Rheumatology and Pulmonology) ?

## 2021-09-19 DIAGNOSIS — H65191 Other acute nonsuppurative otitis media, right ear: Secondary | ICD-10-CM | POA: Diagnosis not present

## 2021-09-19 DIAGNOSIS — J019 Acute sinusitis, unspecified: Secondary | ICD-10-CM | POA: Diagnosis not present

## 2021-09-20 NOTE — Telephone Encounter (Signed)
Called patient to schedule Cimzia new start visit. She states that her last Humira was taken on 09/02/21. She was due to take her Humira on 10/17/21 but held due to infection symptoms. She states she has sinus infection right now and will likely have to wait until next week to start. ? ?Enrolled patient into Cimzia copay card today. ?BIN: [id number] ?PCN: [id number] ?Group: [id number] ?ID: [id number] ? ?Cimzia starter pack rx and maintenance rx has already been triaged to [facility]. ? ?Knox Saliva, PharmD, MPH, BCPS ?Clinical Pharmacist (Rheumatology and Pulmonology) ?

## 2021-09-21 ENCOUNTER — Other Ambulatory Visit (HOSPITAL_COMMUNITY): Payer: Federal, State, Local not specified - PPO

## 2021-09-27 ENCOUNTER — Encounter: Payer: Self-pay | Admitting: Obstetrics & Gynecology

## 2021-09-27 ENCOUNTER — Telehealth: Payer: Self-pay | Admitting: Rheumatology

## 2021-09-27 ENCOUNTER — Ambulatory Visit (INDEPENDENT_AMBULATORY_CARE_PROVIDER_SITE_OTHER): Payer: Federal, State, Local not specified - PPO | Admitting: Obstetrics & Gynecology

## 2021-09-27 VITALS — BP 104/72 | HR 84

## 2021-09-27 DIAGNOSIS — Z3169 Encounter for other general counseling and advice on procreation: Secondary | ICD-10-CM | POA: Diagnosis not present

## 2021-09-27 DIAGNOSIS — Z30011 Encounter for initial prescription of contraceptive pills: Secondary | ICD-10-CM | POA: Diagnosis not present

## 2021-09-27 DIAGNOSIS — Z30432 Encounter for removal of intrauterine contraceptive device: Secondary | ICD-10-CM | POA: Diagnosis not present

## 2021-09-27 MED ORDER — NORETHIN ACE-ETH ESTRAD-FE 1-20 MG-MCG(24) PO TABS: 1.0000 | ORAL_TABLET | Freq: Every day | ORAL | 2 refills | Status: DC

## 2021-09-27 NOTE — Telephone Encounter (Signed)
Patient called the office requesting to speak with Seth Bake to schedule Cimzia injection. ?

## 2021-09-27 NOTE — Progress Notes (Signed)
? ? ?  Toni Livingston [birth year] [id number] ? ? ?     26 y.o.  G0P0000  ? ?RP: Paraguard IUD removal ? ?HPI: CT scan showed the Paraguard IUD mildly mispositioned.  Would like to have the IUD removed.  Planning to attempt conception in the summer of 2023.   ? ? ?OB History  ?Gravida Para Term Preterm AB Living  ?0 0 0 0 0 0  ?SAB IAB Ectopic Multiple Live Births  ?0 0 0 0 0  ? ? ?Past medical history,surgical history, problem list, medications, allergies, family history and social history were all reviewed and documented in the EPIC chart. ? ? ?Directed ROS with pertinent positives and negatives documented in the history of present illness/assessment and plan. ? ?Exam: ? ?Vitals:  ? 09/27/21 1257  ?BP: 104/72  ?Pulse: 84  ?SpO2: 98%  ? ?General appearance:  Normal ? ?Written consent for IUD removal obtained. ?Paraguard IUD removal:  Vulva normal.  Speculum inserted.  Cervix/vagina normal.  Strings not visible at EO.  No bleeding, normal vaginal secretions.  IUD removal clamp used.  IUD strings successfully grasped at the lower endocervical canal.  Mild resistance when pulling on the IUD.  Therefore a fenestrated clamp used to grasp the lower portion of the IUD as soon as it was visible.  Easy removal of the IUD. Speculum removed. The IUD was complete and intact.  Shown to patient and discarded.  Procedure well tolerated with no Cx.   ? ? ?Assessment/Plan:  26 y.o. G0 ? ?1. Encounter for IUD removal ?Removal of ParaGuard IUD.  Mild resistance to removal, but IUD complete and intact.  Well tolerated, no Cx.  Post procedure precautions.   ? ?2. Encounter for initial prescription of contraceptive pills ?Would like to be on the BCPs until attempting conception in the summer of 2023.  Counseling done on BCPs, risks/benefits reviewed.  Usage discussed and prescription sent to pharmacy. ? ?3. Encounter for preconception consultation ?Will probably start attempting  conception in the summer of 2023.  Will start  attempting conception about 1 month after stopping the BCPs, after a normal natural menstrual period.  Nutrition/fitness.  PNVs to start when stopping the BCPs. ? ?Other orders ?- CIMZIA STARTER KIT 6 X 200 MG/ML PSKT; Inject into the skin. ?- Norethindrone Acetate-Ethinyl Estrad-FE (LOESTRIN 24 FE) 1-20 MG-MCG(24) tablet; Take 1 tablet by mouth daily.  ? ?Princess Bruins MD, 1:24 PM 09/27/2021 ? ? ? ?  ?

## 2021-09-28 ENCOUNTER — Encounter: Payer: Self-pay | Admitting: Obstetrics & Gynecology

## 2021-09-28 NOTE — Telephone Encounter (Signed)
Returned call to patient. Patient scheduled for Cimzia new start on 09/29/21 ? ?Knox Saliva, PharmD, MPH, BCPS ?Clinical Pharmacist (Rheumatology and Pulmonology) ?

## 2021-09-28 NOTE — Telephone Encounter (Signed)
Patient scheduled for Cimzia new start on 09/29/21 ? ?Knox Saliva, PharmD, MPH, BCPS ?Clinical Pharmacist (Rheumatology and Pulmonology) ?

## 2021-09-29 ENCOUNTER — Ambulatory Visit (INDEPENDENT_AMBULATORY_CARE_PROVIDER_SITE_OTHER): Payer: Federal, State, Local not specified - PPO | Admitting: Pharmacist

## 2021-09-29 VITALS — BP 115/81 | HR 83

## 2021-09-29 DIAGNOSIS — Z7189 Other specified counseling: Secondary | ICD-10-CM

## 2021-09-29 DIAGNOSIS — M458 Ankylosing spondylitis sacral and sacrococcygeal region: Secondary | ICD-10-CM

## 2021-09-29 DIAGNOSIS — Z79899 Other long term (current) drug therapy: Secondary | ICD-10-CM

## 2021-09-29 MED ORDER — CIMZIA 2 X 200 MG ~~LOC~~ KIT: 400.0000 mg | PACK | SUBCUTANEOUS | 1 refills | Status: DC

## 2021-09-29 MED ORDER — CIMZIA STARTER KIT 6 X 200 MG/ML ~~LOC~~ PSKT: PREFILLED_SYRINGE | SUBCUTANEOUS | 0 refills | Status: DC

## 2021-09-29 NOTE — Patient Instructions (Signed)
Your next CIMZIA dose is due on 10/13/21, 10/27/21, then every 4 weeks thereafter starting 11/24/21 ? ?HOLD CIMZIA if you have signs or symptoms of an infection. You can resume once you feel better or back to your baseline. ?HOLD CIMZIA if you start antibiotics to treat an infection. ?HOLD CIMZIA around the time of surgery/procedures. Your surgeon will be able to provide recommendations on when to hold BEFORE and when you are cleared to [place]. ? ?Pharmacy information: ?Your prescription will be shipped from [place]. ?Their phone number is [phone] ?Please let us know if you need anything from provider and if you don't receive your medication on Friday, 10/01/21 ? ?Cost information: ?Your copay should be affordable. If you call the pharmacy and it is not affordable, please double-check that they are billing through your copay card as secondary coverage. ?That copay card information is: ?BIN: [id number] ?PCN: [id number] ?Group: [id number] ?ID: [id number] ? ?Labs are due in 1 month then every 3 months. ?Lab hours are from Monday to Thursday 1:30-4:30pm and Friday 1:30-4pm. You do not need an appointment if you come for labs during these times. ? ?How to manage an injection site reaction: ?Remember the 5 C's: ?COUNTER - leave on the counter at least 30 minutes but up to overnight to bring medication to room temperature. This may help prevent stinging ?COLD - place something cold (like an ice gel pack or cold water bottle) on the injection site just before cleansing with alcohol. This may help reduce pain ?CLARITIN - use Claritin (generic name is loratadine) for the first two weeks of treatment or the day of, the day before, and the day after injecting. This will help to minimize injection site reactions ?CORTISONE CREAM - apply if injection site is irritated and itching ?CALL ME - if injection site reaction is bigger than the size of your fist, looks infected, blisters, or if you develop hives ? ?

## 2021-09-29 NOTE — Progress Notes (Signed)
Pharmacy Note ? ?Subjective:   ?Patient presents to clinic today to receive first dose of Cimzia for ankylosing spondylitis and recently diagnosed with Crohn's disease. She has developed antibodies to Humira.  She will be actively family planning in the summer. Therefore, shared deciion between GI and rhuem to switch patient to Cimzia given antibody development, family planning status, and recently Crohn's diagnosis. Her last dose of Humira was 09/02/21. ? ?Patient running a fever or have signs/symptoms of infection? No ? ?Patient currently on antibiotics for the treatment of infection? No ? ?Patient have any upcoming invasive procedures/surgeries? No ? ?Objective: ?CMP  ?   ?Component Value Date/Time  ? NA 140 06/03/2021 1403  ? K 4.3 06/03/2021 1403  ? CL 105 06/03/2021 1403  ? CO2 28 06/03/2021 1403  ? GLUCOSE 95 06/03/2021 1403  ? BUN 9 06/03/2021 1403  ? CREATININE 0.87 06/03/2021 1403  ? CALCIUM 9.6 06/03/2021 1403  ? PROT 7.3 06/03/2021 1403  ? ALBUMIN 4.2 12/25/2019 1518  ? AST 19 06/03/2021 1403  ? ALT 15 06/03/2021 1403  ? ALKPHOS 109 12/25/2019 1518  ? BILITOT 0.3 06/03/2021 1403  ? GFRNONAA 122 10/06/2020 1132  ? GFRAA 141 10/06/2020 1132  ? ? ?CBC ?   ?Component Value Date/Time  ? WBC 5.5 06/03/2021 1403  ? RBC 5.16 (H) 06/03/2021 1403  ? HGB 13.8 06/03/2021 1403  ? HCT 41.8 06/03/2021 1403  ? PLT 417 (H) 06/03/2021 1403  ? MCV 81.0 06/03/2021 1403  ? MCH 26.7 (L) 06/03/2021 1403  ? MCHC 33.0 06/03/2021 1403  ? RDW 13.2 06/03/2021 1403  ? LYMPHSABS 1,414 06/03/2021 1403  ? MONOABS 0.4 09/13/2017 1002  ? EOSABS 204 06/03/2021 1403  ? BASOSABS 61 06/03/2021 1403  ? ? ?Baseline Immunosuppressant Therapy Labs ?TB GOLD ? ?  Latest Ref Rng & Units 03/09/2021  ?  1:49 PM  ?Quantiferon TB Gold  ?Quantiferon TB Gold Plus NEGATIVE NEGATIVE    ? ?Hepatitis Panel ? ?  Latest Ref Rng & Units 12/25/2019  ?  3:18 PM  ?Hepatitis  ?Hep B Surface Ag NON-REACTI NON-REACTIVE    ?Hep C Ab NON-REACTI NON-REACTIVE    ? ?HIV ?Lab  Results  ?Component Value Date  ? HIV NONREACTIVE 07/27/2016  ? ?Immunoglobulins ? ?  Latest Ref Rng & Units 04/17/2019  ?  3:35 PM  ?Immunoglobulin Electrophoresis  ?IgA  47 - 310 mg/dL 247    ?IgG 600 - 1,640 mg/dL 1,256    ?IgM 50 - 300 mg/dL 94    ? ?SPEP ? ?  Latest Ref Rng & Units 06/03/2021  ?  2:03 PM  ?Serum Protein Electrophoresis  ?Total Protein 6.1 - 8.1 g/dL 7.3    ? ?G6PD ?No results found for: G6PDH ?TPMT ?No results found for: TPMT  ? ?Chest x-ray: 06/11/21 - normal noncontrast chest CT. ? ?Assessment/Plan:  ?Counseled patient that Cimzia is a TNF blocking agent.  Counseled patient on purpose, proper use, and adverse effects of Cimzia.  Reviewed the most common adverse effects including infections, headache, and injection site reactions. Discussed that there is the possibility of an increased risk of malignancy including non-melanoma skin cancer but it is not well understood if this increased risk is due to the medication or the disease state.  Advised patient to get yearly dermatology exams due to risk of skin cancer.  Counseled patient that Cimzia should be held prior to scheduled surgery.   ? ?Reviewed the importance of regular labs while on Cimzia  therapy. Will monitor CBC and CMP 1 month after starting and then every 3 months routinely thereafter. Will monitor TB gold annually. Standing orders placed. Provided patient with medication education material and answered all questions.  Patient voiced understanding.  Patient consented to Cimzia.  Will upload consent into the media tab.  Reviewed storage instructions for Cimzia.   ? ?Demonstrated proper injection technique with Cimzia demo device  Patient able to demonstrate proper injection technique using the teach back method.  Patient self injected in the left thigh and left abdomen with: ? ?Sample Medication: Cimzia 2 x '200mg'$ /mL PFS ?[place]: [address]-62 ?Lot: [id number] ?Expiration: 10/2021 ? ?Patient tolerated well.  Observed for 30 mins in office for  adverse reaction and none noted.  ? ?Patient is to return in 1 month for labs and 6-8 weeks for follow-up appointment.  Standing orders placed.  ? ?Cimzia approved through insurance .   Rx sent to: [facility]: [phone].  Patient has already talked to specialty pharmacy and activated copay card. She states she is expected to receive shipment on 10/01/21. ? ?She will be seeing [facility] next week to establish care. Reviewed importance of yearly skin exams while on TNF inhibitor due to risk for non-melanoma skin cancer. ? ?She will continue Cimzia '400mg'$  (as two divided doses) SQ at Weeks 0 (administered in clinic today), Week 2, Week 4, then '400mg'$  SQ every 4 weeks thereafter ? ?All questions encouraged and answered.  Instructed patient to call with any further questions or concerns. ? ?Knox Saliva, PharmD, MPH, BCPS ?Clinical Pharmacist (Rheumatology and Pulmonology) ? ?09/29/2021 8:21 AM ?

## 2021-10-05 ENCOUNTER — Encounter: Payer: Self-pay | Admitting: Obstetrics & Gynecology

## 2021-10-05 ENCOUNTER — Ambulatory Visit (INDEPENDENT_AMBULATORY_CARE_PROVIDER_SITE_OTHER): Payer: Federal, State, Local not specified - PPO | Admitting: Physician Assistant

## 2021-10-05 ENCOUNTER — Encounter: Payer: Self-pay | Admitting: Physician Assistant

## 2021-10-05 DIAGNOSIS — S80862A Insect bite (nonvenomous), left lower leg, initial encounter: Secondary | ICD-10-CM | POA: Diagnosis not present

## 2021-10-05 DIAGNOSIS — Z1283 Encounter for screening for malignant neoplasm of skin: Secondary | ICD-10-CM | POA: Diagnosis not present

## 2021-10-05 DIAGNOSIS — S80861A Insect bite (nonvenomous), right lower leg, initial encounter: Secondary | ICD-10-CM | POA: Diagnosis not present

## 2021-10-05 DIAGNOSIS — W57XXXA Bitten or stung by nonvenomous insect and other nonvenomous arthropods, initial encounter: Secondary | ICD-10-CM

## 2021-10-05 MED ORDER — TRIAMCINOLONE ACETONIDE 0.1 % EX CREA: 1.0000 "application " | TOPICAL_CREAM | Freq: Two times a day (BID) | CUTANEOUS | 3 refills | Status: AC | PRN

## 2021-10-05 MED ORDER — NORETHIN ACE-ETH ESTRAD-FE 1-20 MG-MCG(24) PO TABS: 1.0000 | ORAL_TABLET | Freq: Every day | ORAL | 2 refills | Status: DC

## 2021-10-05 NOTE — Progress Notes (Signed)
? ?  New Patient ?  ?Subjective  ?Toni Livingston is a 26 y.o. female who presents for the following: New Patient (Initial Visit) (No new concerns- was told needs yearly skin examination due to taking humira in past and cimzia currently. No personal or family history of non mole skin cancer or melanoma. ). ? ? ?The following portions of the chart were reviewed this encounter and updated as appropriate:  Tobacco  Allergies  Meds  Problems  Med Hx  Surg Hx  Fam Hx   ?  ? ?Objective  ?Well appearing patient in no apparent distress; mood and affect are within normal limits. ? ?A full examination was performed including scalp, head, eyes, ears, nose, lips, neck, chest, axillae, abdomen, back, buttocks, bilateral upper extremities, bilateral lower extremities, hands, feet, fingers, toes, fingernails, and toenails. All findings within normal limits unless otherwise noted below. ? ?No atypical nevi or signs of NMSC noted at the time of the visit.  ? ?Left Leg, Right Leg ?Dyspigmented scar.  ? ? ?Assessment & Plan  ?Encounter for screening for malignant neoplasm of skin ? ?Yearly skin examination  ? ?Bug bite, initial encounter (2) ?Left Leg; Right Leg ? ?triamcinolone cream (KENALOG) 0.1 % - Left Leg, Right Leg ?Apply 1 application. topically 2 (two) times daily as needed. ? ? ? ? ?I, Keajah Killough, PA-C, have reviewed all documentation's for this visit.  The documentation on 10/05/21 for the exam, diagnosis, procedures and orders are all accurate and complete. ?

## 2021-11-04 ENCOUNTER — Ambulatory Visit: Payer: Federal, State, Local not specified - PPO | Admitting: Rheumatology

## 2021-11-19 NOTE — Progress Notes (Signed)
Office Visit Note  Patient: Toni Livingston             Date of Birth: 1995/06/24           MRN: [id number]             PCP: Ma Hillock, DO Referring: Ma Hillock, DO Visit Date: 12/03/2021 Occupation: _0 @  Subjective:  Medication management  History of Present Illness: Toni Livingston is a 26 y.o. female with history of ankylosing spondylitis.  She had been on Humira for 2 years.  It was initially effective but then it stopped working.  She was switched to Cimzia subcu injections.  She has been on Cimzia injections for total of 4 doses.  She has noted improvement in her symptoms.  She denies any lower back pain.  She denies discomfort in any other joints.  There is no history of Achilles tendinitis or planter fasciitis.  She denies recent episodes of uveitis or iritis.  She developed a rash in her axillary region bilaterally.  She believes it is related to the use of deodorant.   Activities of Daily Living:  Patient reports morning stiffness for 0  none .   Patient Denies nocturnal pain.  Difficulty dressing/grooming: Denies Difficulty climbing stairs: Denies Difficulty getting out of chair: Denies Difficulty using hands for taps, buttons, cutlery, and/or writing: Denies  Review of Systems  Constitutional:  Positive for fatigue.  HENT:  Negative for mouth dryness.   Eyes:  Negative for dryness.  Respiratory:  Negative for shortness of breath.   Cardiovascular:  Negative for swelling in legs/feet.  Gastrointestinal:  Negative for constipation.  Endocrine: Negative for excessive thirst.  Genitourinary:  Negative for difficulty urinating.  Musculoskeletal:  Negative for morning stiffness.  Skin:  Positive for rash.  Allergic/Immunologic: Negative for susceptible to infections.  Neurological:  Negative for numbness.  Hematological:  Positive for bruising/bleeding tendency.  Psychiatric/Behavioral:  Positive for sleep disturbance.     PMFS  History:  Patient Active Problem List   Diagnosis Date Noted   Ankylosing spondylitis of sacral region ([place]) 03/09/2021   Retinal edema 07/28/2020   Iridocyclitis associated with HLA-B27 positivity 07/28/2020   Congenital hypertrophy of retinal pigment epithelium of right eye 07/28/2020   IUD (intrauterine device) in place-Paraguard 04/2020 05/22/2020   HLA B27 (HLA B27 positive) 04/16/2019   ANA positive 04/16/2019   Iritis 04/10/2019   DDD (degenerative disc disease), lumbar 04/10/2019   Obesity (BMI 30-39.9) 11/14/2018   Pilonidal cyst 11/14/2018   Oral contraceptive use 07/27/2016   Encounter for long-term current use of medication 07/27/2016    Past Medical History:  Diagnosis Date   Ankylosing spondylitis ([place])    Closed fracture of fifth metacarpal bone 07/19/2019   Crohn's disease ([place])    Iritis    LGSIL of cervix of undetermined significance 11/14/2018   Vaginal high risk HPV DNA test positive 11/19/2018   Vertigo     Family History  Problem Relation Age of Onset   Hypertension Mother    Colon polyps Father    Testicular cancer Father    Healthy Sister    Depression Sister    Anxiety disorder Sister    Healthy Brother    Healthy Brother    Uterine cancer Maternal Aunt    Diabetes Maternal Grandmother    Emphysema Maternal Grandmother    Breast cancer Maternal Grandmother    Diabetes Maternal Grandfather    Colon cancer Neg Hx  Esophageal cancer Neg Hx    Past Surgical History:  Procedure Laterality Date   BREAST REDUCTION SURGERY  2015   COLONOSCOPY  07/2021   HAND SURGERY Left 07/24/2019   Social History   Social History Narrative   Single. Some college.    Works as Therapist, nutritional.    Drinks caffeine.    Wears seatbelt. Smoke detector in the home.    Exercises routinely.    Feels safe in relationships.       Immunization History  Administered Date(s) Administered   Influenza,inj,Quad PF,6+ Mos 03/09/2017, 03/05/2018, 02/27/2019,  06/04/2021   Influenza-Unspecified 03/20/2016   PFIZER(Purple Top)SARS-COV-2 Vaccination 03/23/2020, 04/13/2020, 08/07/2020   PPD Test 07/27/2016   Tdap 07/27/2016     Objective: Vital Signs: BP 119/78 (BP Location: Right Arm, Patient Position: Sitting, Cuff Size: Normal)   Pulse 76   Resp 14   Ht 5' 8" (1.727 m)   Wt 231 lb 6.4 oz (105 kg)   BMI 35.18 kg/m    Physical Exam Vitals and nursing note reviewed.  Constitutional:      Appearance: She is well-developed.  HENT:     Head: Normocephalic and atraumatic.  Eyes:     Conjunctiva/sclera: Conjunctivae normal.  Cardiovascular:     Rate and Rhythm: Normal rate and regular rhythm.     Heart sounds: Normal heart sounds.  Pulmonary:     Effort: Pulmonary effort is normal.     Breath sounds: Normal breath sounds.  Abdominal:     General: Bowel sounds are normal.     Palpations: Abdomen is soft.  Musculoskeletal:     Cervical back: Normal range of motion.  Lymphadenopathy:     Cervical: No cervical adenopathy.  Skin:    General: Skin is warm and dry.     Capillary Refill: Capillary refill takes less than 2 seconds.     Comments: Erythema noted in both axillary regions.  Neurological:     Mental Status: She is alert and oriented to person, place, and time.  Psychiatric:        Behavior: Behavior normal.      Musculoskeletal Exam: C-spine thoracic and lumbar spine were in good range of motion.  She had no SI joint tenderness.  Shoulder joints, elbow joints, wrist joints, MCPs PIPs and DIPs with good range of motion with no synovitis.  Hip joints, knee joints, ankles, MTPs with good range of motion with no synovitis.  There was no Achilles tendinitis or planter fasciitis.  CDAI Exam: CDAI Score: -- Patient Global: --; Provider Global: -- Swollen: --; Tender: -- Joint Exam 12/03/2021   No joint exam has been documented for this visit   There is currently no information documented on the homunculus. Go to the  Rheumatology activity and complete the homunculus joint exam.  Investigation: No additional findings.  Imaging: No results found.  Recent Labs: Lab Results  Component Value Date   WBC 5.5 06/03/2021   HGB 13.8 06/03/2021   PLT 417 (H) 06/03/2021   NA 140 06/03/2021   K 4.3 06/03/2021   CL 105 06/03/2021   CO2 28 06/03/2021   GLUCOSE 95 06/03/2021   BUN 9 06/03/2021   CREATININE 0.87 06/03/2021   BILITOT 0.3 06/03/2021   ALKPHOS 109 12/25/2019   AST 19 06/03/2021   ALT 15 06/03/2021   PROT 7.3 06/03/2021   ALBUMIN 4.2 12/25/2019   CALCIUM 9.6 06/03/2021   GFRAA 141 10/06/2020   QFTBGOLDPLUS NEGATIVE 03/09/2021    Speciality  Comments: humira- inadequate response  Procedures:  No procedures performed Allergies: Patient has no known allergies.   Assessment / Plan:     Visit Diagnoses: Ankylosing spondylitis of sacral region [facility]) - History of chronic SI joint pain, SI joint sclerosis on the x-rays, HLA-B27 positive. SI joints injected on 05/27/2020: Patient was switched from Humira to Cimzia on September 29, 2021.  She has had total of 4 doses of Cimzia.  She has noticed remarkable improvement in her symptoms.  She denies any lower back pain or SI joint pain.  She has had no recurrence of uveitis.  She has been tolerating Cimzia without any side effects.  She denies any episodes of uveitis, Planter fasciitis or Achilles tendinitis recently.  She is a still planning pregnancy and had IUD removed on September 27, 2021.  High risk medication use - Cimzia 400 mg subcu every 28 days, started September 21, 2021 she has had 4 doses so far.  Inadequate response to Humira . -We will obtain labs today and then every 3 months to monitor for drug toxicity.  Plan: CBC with Differential/Platelet, COMPLETE METABOLIC PANEL WITH GFR.  Information about immunization was placed in the AVS.  She was also advised to hold Cimzia if she gets an infection.  Iridocyclitis - Followed by Dr. Charlcie Cradle months.   Patient denies any recent episodes of uveitis.  No conjunctival injection was noted on the examination.  HLA B27 positive  Chronic SI joint pain-SI joint pain improved on Cimzia.  Shortness of breath - Chest CT ordered on 06/11/2021 was normal.  She has establish care with Dr. Shearon Stalls.  ANA positive - She has no clinical features of systemic lupus.   Other acne-facial acne noted.  Over-the-counter products were discussed.  She is advised to discuss this further with the dermatologist  Rash-erythema noted in bilateral axillary region.  The findings are consistent with contact dermatitis.  I advised her to stop using the deodorant.  She has an appointment coming up with the dermatologist.  Pilonidal cyst  Vaginal high risk HPV DNA test positive  Orders: Orders Placed This Encounter  Procedures   CBC with Differential/Platelet   COMPLETE METABOLIC PANEL WITH GFR   No orders of the defined types were placed in this encounter.    Follow-Up Instructions: Return in about 3 months (around 03/05/2022) for Ankylosing spondylitis.   Bo Merino, MD  Note - This record has been created using Editor, commissioning.  Chart creation errors have been sought, but may not always  have been located. Such creation errors do not reflect on  the standard of medical care.

## 2021-12-03 ENCOUNTER — Ambulatory Visit (INDEPENDENT_AMBULATORY_CARE_PROVIDER_SITE_OTHER): Payer: Federal, State, Local not specified - PPO | Admitting: Rheumatology

## 2021-12-03 ENCOUNTER — Encounter: Payer: Self-pay | Admitting: Rheumatology

## 2021-12-03 VITALS — BP 119/78 | HR 76 | Resp 14 | Ht 68.0 in | Wt 231.4 lb

## 2021-12-03 DIAGNOSIS — Z79899 Other long term (current) drug therapy: Secondary | ICD-10-CM

## 2021-12-03 DIAGNOSIS — R21 Rash and other nonspecific skin eruption: Secondary | ICD-10-CM

## 2021-12-03 DIAGNOSIS — M533 Sacrococcygeal disorders, not elsewhere classified: Secondary | ICD-10-CM

## 2021-12-03 DIAGNOSIS — L0591 Pilonidal cyst without abscess: Secondary | ICD-10-CM

## 2021-12-03 DIAGNOSIS — Z1589 Genetic susceptibility to other disease: Secondary | ICD-10-CM

## 2021-12-03 DIAGNOSIS — M458 Ankylosing spondylitis sacral and sacrococcygeal region: Secondary | ICD-10-CM | POA: Diagnosis not present

## 2021-12-03 DIAGNOSIS — R87811 Vaginal high risk human papillomavirus (HPV) DNA test positive: Secondary | ICD-10-CM

## 2021-12-03 DIAGNOSIS — H209 Unspecified iridocyclitis: Secondary | ICD-10-CM

## 2021-12-03 DIAGNOSIS — R768 Other specified abnormal immunological findings in serum: Secondary | ICD-10-CM

## 2021-12-03 DIAGNOSIS — G8929 Other chronic pain: Secondary | ICD-10-CM

## 2021-12-03 DIAGNOSIS — L708 Other acne: Secondary | ICD-10-CM

## 2021-12-03 DIAGNOSIS — R0602 Shortness of breath: Secondary | ICD-10-CM

## 2021-12-03 NOTE — Patient Instructions (Addendum)
Standing Labs We placed an order today for your standing lab work.   Please have your standing labs drawn in September and every 3 months TB gold with next labs If possible, please have your labs drawn 2 weeks prior to your appointment so that the provider can discuss your results at your appointment.  Please note that you may see your imaging and lab results in [place] before we have reviewed them. We may be awaiting multiple results to interpret others before contacting you. Please allow our office up to 72 hours to thoroughly review all of the results before contacting the office for clarification of your results.  We have open lab daily: Monday through Thursday from 1:30-4:30 PM and Friday from 1:30-4:00 PM at the office of Dr. Bo Merino, [facility].   Please be advised, all patients with office appointments requiring lab work will take precedent over walk-in lab work.  If possible, please come for your lab work on Monday and Friday afternoons, as you may experience shorter wait times. The office is located at [address], [place], [place], [place] [address] No appointment is necessary.   Labs are drawn by [facility]. Please bring your co-pay at the time of your lab draw.  You may receive a bill from [place] for your lab work.  Please note if you are on Hydroxychloroquine and and an order has been placed for a Hydroxychloroquine level, you will need to have it drawn 4 hours or more after your last dose.  If you wish to have your labs drawn at another location, please call the office 24 hours in advance to send orders.  If you have any questions regarding directions or hours of operation,  please call [phone].   As a reminder, please drink plenty of water prior to coming for your lab work. Thanks!   Vaccines You are taking a medication(s) that can suppress your immune system.  The following immunizations are recommended: Flu annually Covid-19  Td/Tdap  (tetanus, diphtheria, pertussis) every 10 years Pneumonia (Prevnar 15 then Pneumovax 23 at least 1 year apart.  Alternatively, can take Prevnar 20 without needing additional dose) Shingrix: 2 doses from 4 weeks to 6 months apart  Please check with your PCP to make sure you are up to date.   If you have signs or symptoms of an infection or start antibiotics: First, call your PCP for workup of your infection. Hold your medication through the infection, until you complete your antibiotics, and until symptoms resolve if you take the following: Injectable medication (Actemra, Benlysta, Cimzia, Cosentyx, Enbrel, Humira, Kevzara, Orencia, Remicade, Simponi, Stelara, Taltz, Tremfya) Methotrexate Leflunomide (Arava) Mycophenolate (Cellcept) Morrie Sheldon, Olumiant, or Rinvoq  Please get an annual skin examination to screen for skin cancer while you are on Cimzia.

## 2021-12-04 LAB — CBC WITH DIFFERENTIAL/PLATELET
Absolute Monocytes: 696 cells/uL (ref 200–950)
Basophils Absolute: 37 cells/uL (ref 0–200)
Basophils Relative: 0.5 %
Eosinophils Absolute: 237 cells/uL (ref 15–500)
Eosinophils Relative: 3.2 %
HCT: 42.3 % (ref 35.0–45.0)
Hemoglobin: 13.8 g/dL (ref 11.7–15.5)
Lymphs Abs: 2375 cells/uL (ref 850–3900)
MCH: 26.8 pg — ABNORMAL LOW (ref 27.0–33.0)
MCHC: 32.6 g/dL (ref 32.0–36.0)
MCV: 82.1 fL (ref 80.0–100.0)
MPV: 9.8 fL (ref 7.5–12.5)
Monocytes Relative: 9.4 %
Neutro Abs: 4055 cells/uL (ref 1500–7800)
Neutrophils Relative %: 54.8 %
Platelets: 469 10*3/uL — ABNORMAL HIGH (ref 140–400)
RBC: 5.15 10*6/uL — ABNORMAL HIGH (ref 3.80–5.10)
RDW: 13.6 % (ref 11.0–15.0)
Total Lymphocyte: 32.1 %
WBC: 7.4 10*3/uL (ref 3.8–10.8)

## 2021-12-04 LAB — COMPLETE METABOLIC PANEL WITH GFR
AG Ratio: 1.7 (calc) (ref 1.0–2.5)
ALT: 19 U/L (ref 6–29)
AST: 20 U/L (ref 10–30)
Albumin: 4.6 g/dL (ref 3.6–5.1)
Alkaline phosphatase (APISO): 95 U/L (ref 31–125)
BUN: 9 mg/dL (ref 7–25)
CO2: 25 mmol/L (ref 20–32)
Calcium: 9.9 mg/dL (ref 8.6–10.2)
Chloride: 103 mmol/L (ref 98–110)
Creat: 0.74 mg/dL (ref 0.50–0.96)
Globulin: 2.7 g/dL (calc) (ref 1.9–3.7)
Glucose, Bld: 76 mg/dL (ref 65–99)
Potassium: 4.2 mmol/L (ref 3.5–5.3)
Sodium: 138 mmol/L (ref 135–146)
Total Bilirubin: 0.3 mg/dL (ref 0.2–1.2)
Total Protein: 7.3 g/dL (ref 6.1–8.1)
eGFR: 114 mL/min/{1.73_m2} (ref >=60)

## 2021-12-05 NOTE — Progress Notes (Signed)
CBC and CMP are stable.

## 2021-12-13 ENCOUNTER — Ambulatory Visit: Payer: Federal, State, Local not specified - PPO | Admitting: Gastroenterology

## 2022-01-24 ENCOUNTER — Other Ambulatory Visit: Payer: Self-pay | Admitting: Rheumatology

## 2022-01-25 NOTE — Telephone Encounter (Signed)
Next Visit: 03/07/2022  Last Visit: 12/03/2021  Last Fill: 11/24/2021  DX:Ankylosing spondylitis of sacral region   Current Dose per office note 12/03/2021: Cimzia 400 mg subcu every 28 days  Labs: 12/03/2021 CBC and CMP are stable.  TB Gold: 03/09/2021 Neg    Okay to refill Cimzia?

## 2022-02-01 ENCOUNTER — Ambulatory Visit: Payer: Federal, State, Local not specified - PPO | Admitting: Gastroenterology

## 2022-02-08 DIAGNOSIS — H209 Unspecified iridocyclitis: Secondary | ICD-10-CM | POA: Diagnosis not present

## 2022-02-08 DIAGNOSIS — H3581 Retinal edema: Secondary | ICD-10-CM | POA: Diagnosis not present

## 2022-02-08 DIAGNOSIS — Q141 Congenital malformation of retina: Secondary | ICD-10-CM | POA: Diagnosis not present

## 2022-02-22 NOTE — Progress Notes (Unsigned)
Office Visit Note  Patient: Toni Livingston             Date of Birth: 1996-05-12           MRN: [id number]             PCP: Ma Hillock, DO Referring: Ma Hillock, DO Visit Date: 03/07/2022 Occupation: @GUAROCC @  Subjective:  Medication monitoring   History of Present Illness: Toni Livingston is a 26 y.o. female with history of ankylosing spondylitis and iridocyclitis. She is currently on Cimzia 400 mg sq every 28 days, started September 21, 2021.  She has been tolerating Cimzia without any side effects or injection site reactions.  She has not missed any doses of Cimzia recently.  She denies any signs or symptoms of an ankylosing spondylitis flare.  She denies any SI joint discomfort at this time.  She has not had any Achilles tendinitis or plantar fasciitis.  She denies any joint swelling at this time.  She has not had any flares in her eyes recently.  She denies any recent rashes.  She continues to follow-up with GI on a regular basis.  She has an appointment with Dr. Lorenso Courier at [facility].   Patient is currently [redacted] weeks pregnant.  She plans on continuing on Cimzia throughout the pregnancy. She denies any recent or recurrent infections.  She is planning on getting annual flu shot.   Activities of Daily Living:  Patient reports morning stiffness for 0 minutes.   Patient Denies nocturnal pain.  Difficulty dressing/grooming: Denies Difficulty climbing stairs: Denies Difficulty getting out of chair: Denies Difficulty using hands for taps, buttons, cutlery, and/or writing: Denies  Review of Systems  Constitutional:  Positive for fatigue.  HENT:  Negative for mouth sores and mouth dryness.   Eyes:  Negative for dryness.  Respiratory:  Negative for shortness of breath.   Cardiovascular:  Negative for chest pain and palpitations.  Gastrointestinal:  Negative for blood in stool, constipation and diarrhea.  Endocrine: Negative for increased urination.   Genitourinary:  Negative for involuntary urination.  Musculoskeletal:  Negative for joint pain, gait problem, joint pain, joint swelling, myalgias, muscle weakness, morning stiffness, muscle tenderness and myalgias.  Skin:  Negative for color change, rash, hair loss and sensitivity to sunlight.  Allergic/Immunologic: Negative for susceptible to infections.  Neurological:  Negative for dizziness and headaches.  Hematological:  Negative for swollen glands.  Psychiatric/Behavioral:  Negative for depressed mood and sleep disturbance. The patient is nervous/anxious.     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 07/28/2020   IUD (intrauterine device) in place-Paraguard 04/2020 05/22/2020   HLA B27 (HLA B27 positive) 04/16/2019   ANA positive 04/16/2019   Iritis 04/10/2019   DDD (degenerative disc disease), lumbar 04/10/2019   Obesity (BMI 30-39.9) 11/14/2018   Pilonidal cyst 11/14/2018   Oral contraceptive use 07/27/2016   Encounter for long-term current use of medication 07/27/2016    Past Medical History:  Diagnosis Date   Ankylosing spondylitis ([place])    Closed fracture of fifth metacarpal bone 07/19/2019   Crohn's disease ([place])    Iritis    LGSIL of cervix of undetermined significance 11/14/2018   Vaginal high risk HPV DNA test positive 11/19/2018   Vertigo  Family History  Problem Relation Age of Onset   Hypertension Mother    Colon polyps Father    Testicular cancer Father    Healthy Sister    Depression Sister    Anxiety disorder Sister    Healthy Brother    Healthy Brother    Uterine cancer Maternal Aunt    Diabetes Maternal Grandmother    Emphysema Maternal Grandmother    Breast cancer Maternal Grandmother    Diabetes  Maternal Grandfather    Esophageal cancer Neg Hx    Past Surgical History:  Procedure Laterality Date   BREAST REDUCTION SURGERY  2015   COLONOSCOPY  07/2021   HAND SURGERY Left 07/24/2019   Social History   Social History Narrative   Single. Some college.    Works as Therapist, nutritional.    Drinks caffeine.    Wears seatbelt. Smoke detector in the home.    Exercises routinely.    Feels safe in relationships.       Immunization History  Administered Date(s) Administered   Influenza,inj,Quad PF,6+ Mos 03/09/2017, 03/05/2018, 02/27/2019, 06/04/2021   Influenza-Unspecified 03/20/2016   PFIZER(Purple Top)SARS-COV-2 Vaccination 03/23/2020, 04/13/2020, 08/07/2020   PPD Test 07/27/2016   Tdap 07/27/2016     Objective: Vital Signs: BP 128/85 (BP Location: Left Arm, Patient Position: Sitting, Cuff Size: Normal)   Pulse 90   Resp 16   Ht 5' 8"  (1.727 m)   Wt 234 lb 3.2 oz (106.2 kg)   LMP 01/22/2022 (Exact Date)   BMI 35.61 kg/m    Physical Exam Vitals and nursing note reviewed.  Constitutional:      Appearance: She is well-developed.  HENT:     Head: Normocephalic and atraumatic.  Eyes:     Conjunctiva/sclera: Conjunctivae normal.  Cardiovascular:     Rate and Rhythm: Normal rate and regular rhythm.     Heart sounds: Normal heart sounds.  Pulmonary:     Effort: Pulmonary effort is normal.     Breath sounds: Normal breath sounds.  Abdominal:     General: Bowel sounds are normal.     Palpations: Abdomen is soft.  Musculoskeletal:     Cervical back: Normal range of motion.  Skin:    General: Skin is warm and dry.     Capillary Refill: Capillary refill takes less than 2 seconds.  Neurological:     Mental Status: She is alert and oriented to person, place, and time.  Psychiatric:        Behavior: Behavior normal.      Musculoskeletal Exam: C-spine, thoracic spine, lumbar spine have good range of motion.  No midline spinal tenderness or SI joint tenderness upon  palpation.  Shoulder joints, elbow joints, wrist joints, MCPs, PIPs, DIPs have good range of motion with no synovitis.  No dactylitis noted.  Complete fist formation bilaterally.  Hip joints have good range of motion with no groin pain.  Knee joints have good range of motion with no warmth or effusion.  Ankle joints have good range of motion with no tenderness or synovitis.  No evidence of Achilles tendinitis or plantar fasciitis.  CDAI Exam: CDAI Score: -- Patient Global: --; Provider Global: -- Swollen: --; Tender: -- Joint Exam 03/07/2022   No joint exam has been documented for this visit   There is currently no information documented on the homunculus. Go to the Rheumatology activity and complete the homunculus joint exam.  Investigation: No additional findings.  Imaging: No results found.  Recent Labs: Lab Results  Component Value Date   WBC 7.4 12/03/2021   HGB 13.8 12/03/2021   PLT 469 (H) 12/03/2021   NA 138 12/03/2021   K 4.2 12/03/2021   CL 103 12/03/2021   CO2 25 12/03/2021   GLUCOSE 76 12/03/2021   BUN 9 12/03/2021   CREATININE 0.74 12/03/2021   BILITOT 0.3 12/03/2021   ALKPHOS 109 12/25/2019   AST 20 12/03/2021   ALT 19 12/03/2021   PROT 7.3 12/03/2021   ALBUMIN 4.2 12/25/2019   CALCIUM 9.9 12/03/2021   GFRAA 141 10/06/2020   QFTBGOLDPLUS NEGATIVE 03/09/2021    Speciality Comments: humira- inadequate response Cimzia started September 29, 2021  Procedures:  No procedures performed Allergies: Patient has no known allergies.    Assessment / Plan:     Visit Diagnoses: Ankylosing spondylitis of sacral region [facility]) - History of chronic SI joint pain, SI joint sclerosis on the x-rays, HLA-B27 positive. SI joints injected on 05/27/2020: She has not had any signs or symptoms of a flare since switching from humira to [place].  He is currently on Cimzia 400 mg sq injections every 28 days.  She started on Cimzia on 09/21/2021 and has been tolerating without any side  effects, injection site reactions, or missed doses.  She has not had any recent or recurrent infections.  She has not been experiencing any morning stiffness or nocturnal pain.  No difficulty with ADLs.  She has no midline spinal tenderness or SI joint tenderness upon palpation.  Both hip joints have good range of motion with no groin pain.  She had no synovitis or dactylitis on exam.  No evidence of Achilles tendinitis or plantar fasciitis. She is currently [redacted] weeks pregnant and plans on continuing Cimzia throughout the pregnancy.  She was advised to notify us if she develops signs or symptoms of a flare.  She will follow-up in the office in 3 months or sooner if needed.  High risk medication use - Cimzia 400 mg sq injections every 28 days, started September 21, 2021 - Plan: CBC with Differential/Platelet, COMPLETE METABOLIC PANEL WITH GFR, QuantiFERON-TB Gold Plus TB gold negative on 03/09/2021.  Order for TB Gold release today. CBC and CMP drawn on 12/03/2021.  Orders for CBC and CMP were released today.  Her next lab work will be due in December and every 3 months to monitor for drug toxicity. She has not had any recent or recurrent infections.  Discussed the importance of holding Cimzia if she develops signs or symptoms of an infection and to resume once the infection has completely cleared. Discussed the importance of yearly skin cancer screening while on Cimzia.  Screening for tuberculosis - TB gold order released today. Plan: QuantiFERON-TB Gold Plus  Iridocyclitis - Followed by Dr. Charlcie Cradle months.  She has not had any signs or symptoms of a flare since initiating Cimzia.  No conjunctival injection noted on examination today.  HLA B27 positive  Chronic SI joint pain: Resolved.  No SI joint tenderness upon palpation today.  She has not been experiencing nocturnal pain or morning stiffness.  Shortness of breath - Chest CT ordered on 06/11/2021 was normal.  She has established care with Dr.  Shearon Stalls. She is not experiencing any new or worsening pulmonary symptoms at this time.  ANA positive - She has no clinical features of systemic lupus.   Other medical conditions are listed as follows:   Other acne  Rash - erythema noted in bilateral axillary region  Pilonidal cyst  Vaginal high risk  HPV DNA test positive    Orders: Orders Placed This Encounter  Procedures   CBC with Differential/Platelet   COMPLETE METABOLIC PANEL WITH GFR   QuantiFERON-TB Gold Plus   No orders of the defined types were placed in this encounter.    Follow-Up Instructions: Return in about 3 months (around 06/06/2022) for Ankylosing Spondylitis.   Ofilia Neas, PA-C  Note - This record has been created using Dragon software.  Chart creation errors have been sought, but may not always  have been located. Such creation errors do not reflect on  the standard of medical care.

## 2022-03-02 ENCOUNTER — Other Ambulatory Visit: Payer: Self-pay | Admitting: Obstetrics & Gynecology

## 2022-03-02 ENCOUNTER — Encounter: Payer: Self-pay | Admitting: Obstetrics & Gynecology

## 2022-03-02 ENCOUNTER — Ambulatory Visit: Payer: Federal, State, Local not specified - PPO | Admitting: Obstetrics & Gynecology

## 2022-03-02 VITALS — BP 118/70 | HR 90

## 2022-03-02 DIAGNOSIS — Z3201 Encounter for pregnancy test, result positive: Secondary | ICD-10-CM | POA: Diagnosis not present

## 2022-03-02 DIAGNOSIS — Z3491 Encounter for supervision of normal pregnancy, unspecified, first trimester: Secondary | ICD-10-CM

## 2022-03-02 DIAGNOSIS — N912 Amenorrhea, unspecified: Secondary | ICD-10-CM | POA: Diagnosis not present

## 2022-03-02 LAB — PREGNANCY, URINE: Preg Test, Ur: POSITIVE — AB

## 2022-03-02 MED ORDER — COMPLETENATE 29-1 MG PO CHEW: 1.0000 | CHEWABLE_TABLET | Freq: Every day | ORAL | 4 refills | Status: DC

## 2022-03-02 NOTE — Progress Notes (Signed)
    Toni Livingston 11/20/1995 [id number]        25 y.o.  G1P0000 LMP 01/22/22 for an EDD 10/31/22 at 5 4/7 wks currently.  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  RP: Late menstrual period for pregnancy confirmation  HPI: IUD removed in 09/2021.  Was attempting conception.  LMP 01/22/22.  Normal period, but a few days late.  HPT Neg at that time.  No vaginal bleeding.  No pelvic pain.  Good nutrition.  Physically active.  HPT Positive this week.    OB History  Gravida Para Term Preterm AB Living  1 0 0 0 0 0  SAB IAB Ectopic Multiple Live Births  0 0 0 0 0    # Outcome Date GA Lbr Len/2nd Weight Sex Delivery Anes PTL Lv  1 Current             Past medical history,surgical history, problem list, medications, allergies, family history and social history were all reviewed and documented in the EPIC chart.   Directed ROS with pertinent positives and negatives documented in the history of present illness/assessment and plan.  Exam:  Vitals:   03/02/22 1559  BP: 118/70  Pulse: 90  SpO2: 99%   General appearance:  Normal   Gynecologic exam: Deferred.  UPT Positive   Assessment/Plan:  26 y.o. G1P0000   1. Amenorrhea IUD removed in 09/2021.  Was attempting conception.  LMP 01/22/22.  Normal period, but a few days late.  HPT Neg at that time.  No vaginal bleeding.  No pelvic pain.  Good nutrition.  Physically active.  HPT Positive this week.  UPT Positive here today.  No risk factore for ectopic or spontaneous abortion.  Prefers to establish with an Ob practice and do her first Ob US there.  Start on PNVs, prefers chewable.  Prescription sent to pharmacy.  Physical activity precautions in pregnancy discussed.  List of Ob practices given. - Pregnancy, urine  2. First trimester pregnancy As above  Other orders - prenatal vitamin w/FE, FA (NATACHEW) 29-1 MG CHEW chewable tablet; Chew 1 tablet by mouth daily at 12 noon.   Princess Bruins MD, 4:14 PM  03/02/2022

## 2022-03-03 ENCOUNTER — Telehealth: Payer: Self-pay | Admitting: *Deleted

## 2022-03-03 NOTE — Telephone Encounter (Signed)
Patient called requesting office notes faxed to [facility] for [facility] care. Office notes faxed patient aware.

## 2022-03-07 ENCOUNTER — Encounter: Payer: Self-pay | Admitting: Physician Assistant

## 2022-03-07 ENCOUNTER — Ambulatory Visit: Payer: Federal, State, Local not specified - PPO | Attending: Physician Assistant | Admitting: Physician Assistant

## 2022-03-07 VITALS — BP 128/85 | HR 90 | Resp 16 | Ht 68.0 in | Wt 234.2 lb

## 2022-03-07 DIAGNOSIS — M458 Ankylosing spondylitis sacral and sacrococcygeal region: Secondary | ICD-10-CM

## 2022-03-07 DIAGNOSIS — M533 Sacrococcygeal disorders, not elsewhere classified: Secondary | ICD-10-CM

## 2022-03-07 DIAGNOSIS — Z1589 Genetic susceptibility to other disease: Secondary | ICD-10-CM | POA: Diagnosis not present

## 2022-03-07 DIAGNOSIS — Z79899 Other long term (current) drug therapy: Secondary | ICD-10-CM | POA: Diagnosis not present

## 2022-03-07 DIAGNOSIS — R87811 Vaginal high risk human papillomavirus (HPV) DNA test positive: Secondary | ICD-10-CM

## 2022-03-07 DIAGNOSIS — H209 Unspecified iridocyclitis: Secondary | ICD-10-CM

## 2022-03-07 DIAGNOSIS — R768 Other specified abnormal immunological findings in serum: Secondary | ICD-10-CM

## 2022-03-07 DIAGNOSIS — R7689 Other specified abnormal immunological findings in serum: Secondary | ICD-10-CM

## 2022-03-07 DIAGNOSIS — Z111 Encounter for screening for respiratory tuberculosis: Secondary | ICD-10-CM

## 2022-03-07 DIAGNOSIS — R0602 Shortness of breath: Secondary | ICD-10-CM

## 2022-03-07 DIAGNOSIS — R21 Rash and other nonspecific skin eruption: Secondary | ICD-10-CM

## 2022-03-07 DIAGNOSIS — G8929 Other chronic pain: Secondary | ICD-10-CM

## 2022-03-07 DIAGNOSIS — L0591 Pilonidal cyst without abscess: Secondary | ICD-10-CM

## 2022-03-07 DIAGNOSIS — L708 Other acne: Secondary | ICD-10-CM

## 2022-03-07 NOTE — Patient Instructions (Signed)
Standing Labs We placed an order today for your standing lab work.   Please have your standing labs drawn in December and every 3 months   If possible, please have your labs drawn 2 weeks prior to your appointment so that the provider can discuss your results at your appointment.  Please note that you may see your imaging and lab results in [place] before we have reviewed them. We may be awaiting multiple results to interpret others before contacting you. Please allow our office up to 72 hours to thoroughly review all of the results before contacting the office for clarification of your results.  We currently have open lab daily: Monday through Thursday from 1:30 PM-4:30 PM and Friday from 1:30 PM- 4:00 PM If possible, please come for your lab work on Monday, Thursday or Friday afternoons, as you may experience shorter wait times.   Effective April 20, 2022 the new lab hours will change to: Monday through Thursday from 1:30 PM-5:00 PM and Friday from 8:30 AM-12:00 PM If possible, please come for your lab work on Monday and Thursday afternoons, as you may experience shorter wait times.  Please be advised, all patients with office appointments requiring lab work will take precedent over walk-in lab work.    The office is located at [address] [place], [place], [place] [address] No appointment is necessary.   Labs are drawn by [facility]. Please bring your co-pay at the time of your lab draw.  You may receive a bill from [facility] for your lab work.  Please note if you are on Hydroxychloroquine and and an order has been placed for a Hydroxychloroquine level, you will need to have it drawn 4 hours or more after your last dose.  If you wish to have your labs drawn at another location, please call the office 24 hours in advance to send orders.  If you have any questions regarding directions or hours of operation,  please call [phone].   As a reminder, please drink plenty of water prior  to coming for your lab work. Thanks!  If you have signs or symptoms of an infection or start antibiotics: First, call your PCP for workup of your infection. Hold your medication through the infection, until you complete your antibiotics, and until symptoms resolve if you take the following: Injectable medication (Actemra, Benlysta, Cimzia, Cosentyx, Enbrel, Humira, Kevzara, Orencia, Remicade, Simponi, Stelara, Taltz, Tremfya) Methotrexate Leflunomide (Arava) Mycophenolate (Cellcept) Morrie Sheldon, Olumiant, or Rinvoq  Vaccines You are taking a medication(s) that can suppress your immune system.  The following immunizations are recommended: Flu annually Covid-19  Td/Tdap (tetanus, diphtheria, pertussis) every 10 years Pneumonia (Prevnar 15 then Pneumovax 23 at least 1 year apart.  Alternatively, can take Prevnar 20 without needing additional dose) Shingrix: 2 doses from 4 weeks to 6 months apart  Please check with your PCP to make sure you are up to date.

## 2022-03-08 NOTE — Progress Notes (Signed)
CMP WNL. CBC stable.

## 2022-03-09 ENCOUNTER — Other Ambulatory Visit (INDEPENDENT_AMBULATORY_CARE_PROVIDER_SITE_OTHER): Payer: Federal, State, Local not specified - PPO

## 2022-03-09 ENCOUNTER — Encounter: Payer: Self-pay | Admitting: Internal Medicine

## 2022-03-09 ENCOUNTER — Ambulatory Visit (INDEPENDENT_AMBULATORY_CARE_PROVIDER_SITE_OTHER): Payer: Federal, State, Local not specified - PPO | Admitting: Internal Medicine

## 2022-03-09 VITALS — BP 110/60 | HR 96 | Ht 68.0 in | Wt 236.4 lb

## 2022-03-09 DIAGNOSIS — R197 Diarrhea, unspecified: Secondary | ICD-10-CM

## 2022-03-09 DIAGNOSIS — K50019 Crohn's disease of small intestine with unspecified complications: Secondary | ICD-10-CM

## 2022-03-09 DIAGNOSIS — Z3A01 Less than 8 weeks gestation of pregnancy: Secondary | ICD-10-CM

## 2022-03-09 LAB — SEDIMENTATION RATE: Sed Rate: 21 mm/hr — ABNORMAL HIGH (ref 0–20)

## 2022-03-09 NOTE — Progress Notes (Signed)
Chief Complaint: Diarrhea and positive p-ANCA  HPI:    Toni Livingston is a 26 year old female with history of ileal Crohn's disease and ankylosing spondylitis presents for follow up of Crohn's disease  Interval History: She was started on Cimzia in 09/2021. After she was started on the Cimzia, she had significant improvement in her joint pain. She has been getting her Cimzia injections about every 4 weeks. Her next dose of Cimzia is due in 1 week. Sometimes she will eat and then immediately have to go to the bathroom, which she does think may be food-related. She is having on average 1-2 BMs per day. She is 6.[redacted] weeks pregnant currently. She has been more constipated ever since she has been pregnant. Denies nausea. 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  Denies blood in the stools. Denies bloating and ab pain currently.  Current Outpatient Medications  Medication Sig Dispense Refill   CIMZIA 2 X 200 MG/ML PSKT INJECT 2 SYRINGES UNDER THE SKIN EVERY 4 WEEKS 1 each 2   Difluprednate (DUREZOL OP) Apply to eye as needed.     Prenatal Vit-Fe Fumarate-FA (M-NATAL PLUS) 27-1 MG TABS Take 1 tablet by mouth daily. 30 tablet 3   triamcinolone cream (KENALOG) 0.1 % Apply 1 application. topically 2 (two) times daily as needed. 80 g 3   No current facility-administered medications for this visit.     Physical Exam:  Vital signs: Ht _0  (1.727 m)   Wt 236 lb 6 oz (107.2 kg)   LMP 01/22/2022 (Exact Date)   BMI 35.94 kg/m   Constitutional:   Pleasant Caucasian female appears to be in NAD, Well developed, Well nourished, alert and cooperative Respiratory: Respirations even and unlabored. Lungs clear to auscultation bilaterally.   No wheezes, crackles, or rhonchi.  Cardiovascular: Normal S1, S2. No MRG. Regular rate and rhythm. No peripheral edema, cyanosis or pallor.  Gastrointestinal:  Soft, nondistended, nontender. No rebound or guarding. Normal bowel sounds. No  appreciable masses or hepatomegaly. Rectal:  Not performed.  Psychiatric:  Demonstrates good judgement and reason without abnormal affect or behaviors.  RELEVANT LABS AND IMAGING: CBC    Component Value Date/Time   WBC 7.9 03/07/2022 1328   RBC 5.09 03/07/2022 1328   HGB 13.6 03/07/2022 1328   HCT 42.0 03/07/2022 1328   PLT 431 (H) 03/07/2022 1328   MCV 82.5 03/07/2022 1328   MCH 26.7 (L) 03/07/2022 1328   MCHC 32.4 03/07/2022 1328   RDW 14.2 03/07/2022 1328   LYMPHSABS 2,046 03/07/2022 1328   MONOABS 0.4 09/13/2017 1002   EOSABS 190 03/07/2022 1328   BASOSABS 63 03/07/2022 1328    CMP     Component Value Date/Time   NA 138 03/07/2022 1328   K 3.9 03/07/2022 1328   CL 105 03/07/2022 1328   CO2 26 03/07/2022 1328   GLUCOSE 98 03/07/2022 1328   BUN 7 03/07/2022 1328   CREATININE 0.72 03/07/2022 1328   CALCIUM 9.9 03/07/2022 1328   PROT 7.3 03/07/2022 1328   ALBUMIN 4.2 12/25/2019 1518   AST 19 03/07/2022 1328   ALT 20 03/07/2022 1328   ALKPHOS 109 12/25/2019 1518   BILITOT 0.4 03/07/2022 1328   GFRNONAA 122 10/06/2020 1132   GFRAA 141 10/06/2020 1132   Labs 08/2021: Humira level <0.6. Humira antibody level of 3820.   Labs 11/2021: CBC and CMP unremarkable.  Labs 02/2022: CBC and CMP unremarkable.  CT enterography 09/06/21: IMPRESSION: 1. There is short  segment circumferential wall thickening and mucosal hyperenhancement of the terminal ileum, involving a segment approximately 4 cm in length from the ileocecal valve. This appearance is in keeping with Crohn's ileitis. No evidence of complicating stricture, fistula, or abscess at this time. No other evidence of bowel inflammation. 2. IUD is present in the uterus, although in an abnormal appearing low lying position, within the lower uterine segment and or endocervical canal. Consider pelvic ultrasound to assess appropriate positioning. 3. No osseous stigmata of ankylosing spondylitis by CT.  Colonoscopy  08/16/21: - Inflammed and strictured terminal ileum, otherwise the examination was normal. - Biopsies taken from terminal ileum, right and left colon. - The examination was otherwise normal on direct and retroflexion views. Path: 1. Surgical [P], small bowel, terminal ileum - SEVERELY ACTIVE CHRONIC, NONSPECIFIC ILEITIS - NO GRANULOMAS, DYSPLASIA OR MALIGNANCY IDENTIFIED - SEE COMMENT 2. Surgical [P], right colon biopsy - BENIGN COLONIC MUCOSA - NO ACTIVE INFLAMMATION OR EVIDENCE OF MICROSCOPIC COLITIS - NO HIGH-GRADE DYSPLASIA OR MALIGNANCY IDENTIFIED 3. Surgical [P], left colon biopsy - BENIGN COLONIC MUCOSA - NO ACTIVE INFLAMMATION OR EVIDENCE OF MICROSCOPIC COLITIS - NO HIGH-GRADE DYSPLASIA OR MALIGNANCY IDENTIFIED Microscopic Comment 1. The biopsy is scant and not typical of inflammatory bowel disease. The differential diagnosis would include drugs, infection and inflammatory bowel disease. Radiologic correlation and patient follow-up is suggested. Dr. Vic Ripper reviewed the case and agrees with the above diagnosis.  Assessment: Crohn's disease Diarrhea Pregnancy Patient presents for follow up of Crohn's disease. She has been doing well on Cimzia since 09/2021, switched from Humira because she developed antibodies. She recently discovered that she is pregnant so I emphasized that it will be important that she stay on her Cimzia medication in order to minimize the amount of inflammation due to Crohn's disease. Will avoid abdominal imaging and scopes if possible while she is pregnant. Will instead check her inflammatory markers and Cimzia level and antibody for drug monitoring. She is having some diarrhea, which may be related to her diet. Thus I gave her information on the low FODMAP diet to aid in identifying potential trigger foods.  Plan: - Low FODMAP diet - Check CRP, ESR, Cimzia level and antibody - Check fecal calprotectin - RTC in 3 months  I spent 44 minutes of time,  including in depth chart review, independent review of results as outlined above, communicating results with the patient directly, face-to-face time with the patient, coordinating care, ordering studies and medications as appropriate, and documentation.

## 2022-03-09 NOTE — Patient Instructions (Signed)
If you are age 27 or older, your body mass index should be between 23-30. Your Body mass index is 35.94 kg/m. If this is out of the aforementioned range listed, please consider follow up with your Primary Care Provider.  If you are age 97 or younger, your body mass index should be between 19-25. Your Body mass index is 35.94 kg/m. If this is out of the aformentioned range listed, please consider follow up with your Primary Care Provider.   Your provider has requested that you go to the basement level for lab work before leaving today. Press "B" on the elevator. The lab is located at the first door on the left as you exit the elevator.    The [place] [facility] providers would like to encourage you to use [facility] to communicate with providers for non-urgent requests or questions.  Due to long hold times on the telephone, sending your provider a message by [facility] may be a faster and more efficient way to get a response.  Please allow 48 business hours for a response.  Please remember that this is for non-urgent requests.   Due to recent changes in healthcare laws, you may see the results of your imaging and laboratory studies on MyChart before your provider has had a chance to review them.  We understand that in some cases there may be results that are confusing or concerning to you. Not all laboratory results come back in the same time frame and the provider may be waiting for multiple results in order to interpret others.  Please give [place] 48 hours in order for your provider to thoroughly review all the results before contacting the office for clarification of your results.    Thank you for entrusting me with your care and for choosing [facility], Dr. Christia Reading

## 2022-03-10 ENCOUNTER — Other Ambulatory Visit: Payer: Federal, State, Local not specified - PPO

## 2022-03-10 DIAGNOSIS — K50019 Crohn's disease of small intestine with unspecified complications: Secondary | ICD-10-CM | POA: Diagnosis not present

## 2022-03-10 DIAGNOSIS — R197 Diarrhea, unspecified: Secondary | ICD-10-CM | POA: Diagnosis not present

## 2022-03-10 LAB — COMPLETE METABOLIC PANEL WITH GFR
AG Ratio: 1.6 (calc) (ref 1.0–2.5)
ALT: 20 U/L (ref 6–29)
AST: 19 U/L (ref 10–30)
Albumin: 4.5 g/dL (ref 3.6–5.1)
Alkaline phosphatase (APISO): 78 U/L (ref 31–125)
BUN: 7 mg/dL (ref 7–25)
CO2: 26 mmol/L (ref 20–32)
Calcium: 9.9 mg/dL (ref 8.6–10.2)
Chloride: 105 mmol/L (ref 98–110)
Creat: 0.72 mg/dL (ref 0.50–0.96)
Globulin: 2.8 g/dL (calc) (ref 1.9–3.7)
Glucose, Bld: 98 mg/dL (ref 65–99)
Potassium: 3.9 mmol/L (ref 3.5–5.3)
Sodium: 138 mmol/L (ref 135–146)
Total Bilirubin: 0.4 mg/dL (ref 0.2–1.2)
Total Protein: 7.3 g/dL (ref 6.1–8.1)
eGFR: 118 mL/min/{1.73_m2} (ref >=60)

## 2022-03-10 LAB — CBC WITH DIFFERENTIAL/PLATELET
Absolute Monocytes: 656 cells/uL (ref 200–950)
Basophils Absolute: 63 cells/uL (ref 0–200)
Basophils Relative: 0.8 %
Eosinophils Absolute: 190 cells/uL (ref 15–500)
Eosinophils Relative: 2.4 %
HCT: 42 % (ref 35.0–45.0)
Hemoglobin: 13.6 g/dL (ref 11.7–15.5)
Lymphs Abs: 2046 cells/uL (ref 850–3900)
MCH: 26.7 pg — ABNORMAL LOW (ref 27.0–33.0)
MCHC: 32.4 g/dL (ref 32.0–36.0)
MCV: 82.5 fL (ref 80.0–100.0)
MPV: 10.3 fL (ref 7.5–12.5)
Monocytes Relative: 8.3 %
Neutro Abs: 4945 cells/uL (ref 1500–7800)
Neutrophils Relative %: 62.6 %
Platelets: 431 10*3/uL — ABNORMAL HIGH (ref 140–400)
RBC: 5.09 10*6/uL (ref 3.80–5.10)
RDW: 14.2 % (ref 11.0–15.0)
Total Lymphocyte: 25.9 %
WBC: 7.9 10*3/uL (ref 3.8–10.8)

## 2022-03-10 LAB — QUANTIFERON-TB GOLD PLUS
Mitogen-NIL: >10 IU/mL
NIL: 0.03 IU/mL
QuantiFERON-TB Gold Plus: NEGATIVE
TB1-NIL: 0.02 IU/mL
TB2-NIL: 0 IU/mL

## 2022-03-10 NOTE — Progress Notes (Signed)
TB Gold is negative.

## 2022-03-15 LAB — SERIAL MONITORING

## 2022-03-16 LAB — CERTOLIZUMAB AND ANTI-CERTO AB
Anti-Certolizumab Ab Level: 575 ng/mL
Certolizumab DRUG Level: 17 ug/mL

## 2022-03-16 LAB — CALPROTECTIN, FECAL: Calprotectin, Fecal: 100 ug/g (ref 0–120)

## 2022-03-21 DIAGNOSIS — Z3201 Encounter for pregnancy test, result positive: Secondary | ICD-10-CM | POA: Diagnosis not present

## 2022-04-04 DIAGNOSIS — Z23 Encounter for immunization: Secondary | ICD-10-CM | POA: Diagnosis not present

## 2022-04-04 DIAGNOSIS — O200 Threatened abortion: Secondary | ICD-10-CM | POA: Diagnosis not present

## 2022-04-12 DIAGNOSIS — R87610 Atypical squamous cells of undetermined significance on cytologic smear of cervix (ASC-US): Secondary | ICD-10-CM | POA: Diagnosis not present

## 2022-04-12 DIAGNOSIS — Z3689 Encounter for other specified antenatal screening: Secondary | ICD-10-CM | POA: Diagnosis not present

## 2022-04-12 DIAGNOSIS — Z3401 Encounter for supervision of normal first pregnancy, first trimester: Secondary | ICD-10-CM | POA: Diagnosis not present

## 2022-04-12 LAB — OB RESULTS CONSOLE RPR: RPR: NONREACTIVE

## 2022-04-12 LAB — OB RESULTS CONSOLE HIV ANTIBODY (ROUTINE TESTING): HIV: NONREACTIVE

## 2022-04-12 LAB — OB RESULTS CONSOLE HEPATITIS B SURFACE ANTIGEN: Hepatitis B Surface Ag: NEGATIVE

## 2022-04-12 LAB — OB RESULTS CONSOLE GC/CHLAMYDIA
Chlamydia: NEGATIVE
Neisseria Gonorrhea: NEGATIVE

## 2022-04-12 LAB — OB RESULTS CONSOLE RUBELLA ANTIBODY, IGM: Rubella: IMMUNE

## 2022-04-12 LAB — HEPATITIS C ANTIBODY: HCV Ab: NEGATIVE

## 2022-04-18 ENCOUNTER — Other Ambulatory Visit: Payer: Self-pay | Admitting: Rheumatology

## 2022-04-19 NOTE — Telephone Encounter (Signed)
Next Visit: 06/07/2022  Last Visit: 03/07/2022  Last Fill: 01/25/2022   DX: Ankylosing spondylitis of sacral region   Current Dose per office note 03/07/2022: Cimzia 400 mg sq injections every 28 days  Labs: 03/07/2022 CMP WNL. CBC stable.   TB Gold: 03/07/2022 Neg    Okay to refill Cimzia?

## 2022-04-20 LAB — HM PAP SMEAR: Pap: NEGATIVE

## 2022-05-26 NOTE — Progress Notes (Signed)
Office Visit Note  Patient: Toni Livingston             Date of Birth: Sep 15, 1995           MRN: [id number]             PCP: Ma Hillock, DO Referring: Ma Hillock, DO Visit Date: 06/07/2022 Occupation: _0 @  Subjective:  Discuss cimzia dosing   History of Present Illness: Reathel Turi is a 26 y.o. female with history of ankylosing spondylitis, Crohn's, and iridocyclitis . She remains on Cimzia 400 mg sq injections every 28 days, started September 21, 2021. Patient is currently 4.5 months pregnant.  Patient was evaluated by Dr. Lorenso Courier (GI) on 03/09/22 at which time she had her Cimzia level checked along with fecal calprotectin testing. she continues to tolerate Cimzia without any side effects or injection site reactions.  She has not missed any doses of Cimzia recently.  Patient reports that at the end of November she had a flare in her left eye requiring prednisone drops for 4 days.  She states that she has had some redness in the right eye since then.  She was evaluated by Dr. Manuella Ghazi today and was told that she did not have any active inflammation at this time. She reports initially after switching from Humira to Cimzia she had noticed remarkable improvement in her joint pain and inflammation.  Recently she has started to notice some increased discomfort in her right SI joint and occasionally in her right knee joint.  She denies any joint swelling.  She has not had any nocturnal pain.  She denies any morning stiffness.  She denies any Achilles tendinitis or plantar fasciitis.  She denies any recent rashes.  She has not had any recent or recurrent infections.     Activities of Daily Living:  Patient reports morning stiffness for 0 minutes.   Patient Denies nocturnal pain.  Difficulty dressing/grooming: Denies Difficulty climbing stairs: Denies Difficulty getting out of chair: Denies Difficulty using hands for taps, buttons, cutlery, and/or writing:  Denies  Review of Systems  Constitutional:  Positive for fatigue.  HENT:  Negative for mouth sores and mouth dryness.   Eyes:  Negative for dryness.  Respiratory:  Negative for shortness of breath.   Cardiovascular:  Negative for chest pain and palpitations.  Gastrointestinal:  Negative for blood in stool, constipation and diarrhea.  Endocrine: Negative for increased urination.  Genitourinary:  Negative for involuntary urination.  Musculoskeletal:  Positive for joint pain and joint pain. Negative for gait problem, joint swelling, myalgias, muscle weakness, morning stiffness, muscle tenderness and myalgias.  Skin:  Negative for color change, rash, hair loss and sensitivity to sunlight.  Allergic/Immunologic: Negative for susceptible to infections.  Neurological:  Negative for dizziness and headaches.  Hematological:  Negative for swollen glands.  Psychiatric/Behavioral:  Negative for depressed mood and sleep disturbance. The patient is not nervous/anxious.     PMFS History:  Patient Active Problem List   Diagnosis Date Noted   Ankylosing spondylitis of sacral region ([place]) 03/09/2021   Retinal edema 07/28/2020   Iridocyclitis associated with HLA-B27 positivity 07/28/2020   Congenital hypertrophy of retinal pigment epithelium of right eye 07/28/2020   IUD (intrauterine device) in place-Paraguard 04/2020 05/22/2020   HLA B27 (HLA B27 positive) 04/16/2019   ANA positive 04/16/2019   Iritis 04/10/2019   DDD (degenerative disc disease), lumbar 04/10/2019   Obesity (BMI 30-39.9) 11/14/2018   Pilonidal cyst 11/14/2018   Oral contraceptive use  07/27/2016   Encounter for long-term current use of medication 07/27/2016    Past Medical History:  Diagnosis Date   Ankylosing spondylitis ([place])    Closed fracture of fifth metacarpal bone 07/19/2019   Crohn's disease ([place])    Iritis    LGSIL of cervix of undetermined significance 11/14/2018   Vaginal high risk HPV DNA test positive 11/19/2018    Vertigo     Family History  Problem Relation Age of Onset   Hypertension Mother    Colon polyps Father    Testicular cancer Father    Healthy Sister    Depression Sister    Anxiety disorder Sister    Healthy Brother    Healthy Brother    Uterine cancer Maternal Aunt    Diabetes Maternal Grandmother    Emphysema Maternal Grandmother    Breast cancer Maternal Grandmother    Diabetes Maternal Grandfather    Esophageal cancer Neg Hx    Past Surgical History:  Procedure Laterality Date   BREAST REDUCTION SURGERY  2015   COLONOSCOPY  07/2021   HAND SURGERY Left 07/24/2019   Social History   Social History Narrative   Single. Some college.    Works as Therapist, nutritional.    Drinks caffeine.    Wears seatbelt. Smoke detector in the home.    Exercises routinely.    Feels safe in relationships.       Immunization History  Administered Date(s) Administered   Influenza,inj,Quad PF,6+ Mos 03/09/2017, 03/05/2018, 02/27/2019, 06/04/2021   Influenza-Unspecified 03/20/2016   PFIZER(Purple Top)SARS-COV-2 Vaccination 03/23/2020, 04/13/2020, 08/07/2020   PPD Test 07/27/2016   Tdap 07/27/2016     Objective: Vital Signs: BP 113/75 (BP Location: Left Arm, Patient Position: Sitting, Cuff Size: Normal)   Pulse (!) 121   Resp 17   Ht _0  (1.727 m)   Wt 227 lb (103 kg)   LMP 01/22/2022 (Exact Date)   BMI 34.52 kg/m    Physical Exam Vitals and nursing note reviewed.  Constitutional:      Appearance: She is well-developed.  HENT:     Head: Normocephalic and atraumatic.  Eyes:     Conjunctiva/sclera: Conjunctivae normal.  Cardiovascular:     Rate and Rhythm: Normal rate and regular rhythm.     Heart sounds: Normal heart sounds.  Pulmonary:     Effort: Pulmonary effort is normal.     Breath sounds: Normal breath sounds.  Abdominal:     General: Bowel sounds are normal.     Palpations: Abdomen is soft.  Musculoskeletal:     Cervical back: Normal range of motion.   Skin:    General: Skin is warm and dry.     Capillary Refill: Capillary refill takes less than 2 seconds.  Neurological:     Mental Status: She is alert and oriented to person, place, and time.  Psychiatric:        Behavior: Behavior normal.      Musculoskeletal Exam: C-spine, thoracic spine, and lumbar spine good ROM.  No midline spinal tenderness.  No SI joint tenderness upon palpation.  Shoulder joints, elbow joints, wrist joints, MCPs, PIPs, DIPs have good range of motion with no synovitis.  Complete fist formation bilaterally.  Hip joints have good range of motion with no groin pain.  Knee joints have good range of motion with no warmth or effusion.  Ankle joints have good range of motion with no tenderness or joint swelling.  No evidence of Achilles tendinitis or plantar fasciitis.  CDAI Exam: CDAI Score: -- Patient Global: --; Provider Global: -- Swollen: --; Tender: -- Joint Exam 06/07/2022   No joint exam has been documented for this visit   There is currently no information documented on the homunculus. Go to the Rheumatology activity and complete the homunculus joint exam.  Investigation: No additional findings.  Imaging: No results found.  Recent Labs: Lab Results  Component Value Date   WBC 7.9 03/07/2022   HGB 13.6 03/07/2022   PLT 431 (H) 03/07/2022   NA 138 03/07/2022   K 3.9 03/07/2022   CL 105 03/07/2022   CO2 26 03/07/2022   GLUCOSE 98 03/07/2022   BUN 7 03/07/2022   CREATININE 0.72 03/07/2022   BILITOT 0.4 03/07/2022   ALKPHOS 109 12/25/2019   AST 19 03/07/2022   ALT 20 03/07/2022   PROT 7.3 03/07/2022   ALBUMIN 4.2 12/25/2019   CALCIUM 9.9 03/07/2022   GFRAA 141 10/06/2020   QFTBGOLDPLUS NEGATIVE 03/07/2022    Speciality Comments: humira- inadequate response Cimzia started September 29, 2021  Procedures:  No procedures performed Allergies: Patient has no known allergies.      Assessment / Plan:     Visit Diagnoses: Ankylosing  spondylitis of sacral region [facility]) - History of chronic SI joint pain, SI joint sclerosis on the x-rays, HLA-B27 positive. SI joints injected on 05/27/2020: She has no synovitis or dactylitis on examination today.  No evidence of Achilles tendinitis or plantar fasciitis.  She has no midline spinal tenderness or SI joint tenderness upon palpation.  She has not been experiencing any nocturnal pain or morning stiffness.  She remains on Cimzia 400 mg subcutaneous injections every 28 days which was initiated on 09/21/2021.  She had initially noticed significant improvement when switching from Humira to Cimzia.  Over the past couple of months she has noticed some breakthrough symptoms including increased discomfort in the right SI joint intermittently.  Of note she is currently 4-1/2 months pregnant so her treatment options are limited-not a candidate for adding MTX as combination therapy.   She remains under the care of Dr. Manuella Ghazi for management of iridocyclitis.  According to the patient she had a flare in the left eye at the end of November requiring prednisolone eyedrops x 4 days.  She is currently having some redness in the right eye and was evaluated by Dr. Manuella Ghazi today-she was told she had no active inflammation today.  No medication changes were recommended.  She also remains under the care of Dr. Marga Melnick for management of ileal Crohn's disease. Fecal calprotectin revealed borderline inflammation on 03/10/22.  Certolizumab-therapeutic level-17/anti-certo ab-starting to develop some antibodies: 575 on 03/09/22. Dr. Lorenso Courier planned to recheck drug level and antibody level in 3 months.  If necessary Dr. Lorenso Courier suggested increasing the dose of cimzia in the future. Patient was advised to schedule a follow up visit with Dr. Dorsey/Dr. Ardis Hughs for updated lab work and a further evaluation.  No medication changes will be made at this time.  She will follow up in 2-3 months or sooner if needed.   High risk medication  use - Cimzia 400 mg sq injections every 28 days, started September 21, 2021.  CBC and CMP updated on 03/07/22.  Orders for CBC and CMP released. - Plan: CBC with Differential/Platelet, COMPLETE METABOLIC PANEL WITH GFR TB gold negative on 03/07/22.  Previous therapy: Humira-inadequate response-developed drug antibodies.  Discussed the importance of holding cimzia if she develops signs or symptoms of an infection and to resume  once the infection has completely cleared.   Iridocyclitis - Followed by Dr. Charlcie Cradle months. Evaluated today-no OV note to review yet.  Recent flare left eye-end of November 2023-resolved after prednisolone drops x4 days.  Right eye redness currently-no inflammation at visit with Dr. Manuella Ghazi per patient.  Remains on cimzia. Inadequate response to Humira.  Not a good candidate for MTX at this time due to currently being pregnant.    HLA B27 positive  Chronic SI joint pain - She has restarted to have intermittent pain in the right SI joint.  No tenderness upon palpation today.  No morning stiffness or nocturnal pain.   Crohn's disease of colon with other complication [facility]): Currently experiencing intermittent constipation.  No diarrhea recently. Reviewed Dr. Libby Maw note from 03/09/2022.  Fecal calprotectin 100-borderline inflammation on 03/10/22.   Cimzia drug level was 17 and anti-certolizumab antibody level was 575 on 03/09/22.  Dr. Lorenso Courier planned to recheck Cimzia level and antibodies in 3 months.  If necessary the plan was to consider increasing the dose of Cimzia in the future.  Discussed that she is not a good candidate for methotrexate due to current pregnancy. Advised patient to call Dr. Dorsey/Dr. Ardis Hughs to schedule a follow up visit/updated lab work.   Other medical conditions are listed as follows:   Shortness of breath - Chest CT ordered on 06/11/2021 was normal.  She has established care with Dr. Shearon Stalls.  ANA positive - She has no clinical features of systemic  lupus.  Other acne  Rash: Resolved. No recurrence.   Pilonidal cyst  Vaginal high risk HPV DNA test positive    Orders: Orders Placed This Encounter  Procedures   CBC with Differential/Platelet   COMPLETE METABOLIC PANEL WITH GFR   No orders of the defined types were placed in this encounter.    Follow-Up Instructions: Return in about 3 months (around 09/06/2022).   Ofilia Neas, PA-C  Note - This record has been created using Dragon software.  Chart creation errors have been sought, but may not always  have been located. Such creation errors do not reflect on  the standard of medical care.

## 2022-06-07 ENCOUNTER — Ambulatory Visit: Payer: Federal, State, Local not specified - PPO | Attending: Physician Assistant | Admitting: Physician Assistant

## 2022-06-07 ENCOUNTER — Encounter: Payer: Self-pay | Admitting: Physician Assistant

## 2022-06-07 VITALS — BP 113/75 | HR 121 | Resp 17 | Ht 68.0 in | Wt 227.0 lb

## 2022-06-07 DIAGNOSIS — Z79899 Other long term (current) drug therapy: Secondary | ICD-10-CM | POA: Diagnosis not present

## 2022-06-07 DIAGNOSIS — R87811 Vaginal high risk human papillomavirus (HPV) DNA test positive: Secondary | ICD-10-CM

## 2022-06-07 DIAGNOSIS — H3581 Retinal edema: Secondary | ICD-10-CM | POA: Diagnosis not present

## 2022-06-07 DIAGNOSIS — Z1589 Genetic susceptibility to other disease: Secondary | ICD-10-CM

## 2022-06-07 DIAGNOSIS — R768 Other specified abnormal immunological findings in serum: Secondary | ICD-10-CM

## 2022-06-07 DIAGNOSIS — R7689 Other specified abnormal immunological findings in serum: Secondary | ICD-10-CM

## 2022-06-07 DIAGNOSIS — M458 Ankylosing spondylitis sacral and sacrococcygeal region: Secondary | ICD-10-CM

## 2022-06-07 DIAGNOSIS — R21 Rash and other nonspecific skin eruption: Secondary | ICD-10-CM

## 2022-06-07 DIAGNOSIS — L0591 Pilonidal cyst without abscess: Secondary | ICD-10-CM

## 2022-06-07 DIAGNOSIS — H209 Unspecified iridocyclitis: Secondary | ICD-10-CM | POA: Diagnosis not present

## 2022-06-07 DIAGNOSIS — M533 Sacrococcygeal disorders, not elsewhere classified: Secondary | ICD-10-CM

## 2022-06-07 DIAGNOSIS — L708 Other acne: Secondary | ICD-10-CM

## 2022-06-07 DIAGNOSIS — R0602 Shortness of breath: Secondary | ICD-10-CM

## 2022-06-07 DIAGNOSIS — G8929 Other chronic pain: Secondary | ICD-10-CM

## 2022-06-07 DIAGNOSIS — K50118 Crohn's disease of large intestine with other complication: Secondary | ICD-10-CM

## 2022-06-07 DIAGNOSIS — Q141 Congenital malformation of retina: Secondary | ICD-10-CM | POA: Diagnosis not present

## 2022-06-08 LAB — CBC WITH DIFFERENTIAL/PLATELET
Absolute Monocytes: 764 cells/uL (ref 200–950)
Basophils Absolute: 20 cells/uL (ref 0–200)
Basophils Relative: 0.2 %
Eosinophils Absolute: 216 cells/uL (ref 15–500)
Eosinophils Relative: 2.2 %
HCT: 39.3 % (ref 35.0–45.0)
Hemoglobin: 13.4 g/dL (ref 11.7–15.5)
Lymphs Abs: 1891 cells/uL (ref 850–3900)
MCH: 28.5 pg (ref 27.0–33.0)
MCHC: 34.1 g/dL (ref 32.0–36.0)
MCV: 83.6 fL (ref 80.0–100.0)
MPV: 10.1 fL (ref 7.5–12.5)
Monocytes Relative: 7.8 %
Neutro Abs: 6909 cells/uL (ref 1500–7800)
Neutrophils Relative %: 70.5 %
Platelets: 335 10*3/uL (ref 140–400)
RBC: 4.7 10*6/uL (ref 3.80–5.10)
RDW: 14.3 % (ref 11.0–15.0)
Total Lymphocyte: 19.3 %
WBC: 9.8 10*3/uL (ref 3.8–10.8)

## 2022-06-08 LAB — COMPLETE METABOLIC PANEL WITH GFR
AG Ratio: 1.4 (calc) (ref 1.0–2.5)
ALT: 16 U/L (ref 6–29)
AST: 17 U/L (ref 10–30)
Albumin: 4 g/dL (ref 3.6–5.1)
Alkaline phosphatase (APISO): 85 U/L (ref 31–125)
BUN/Creatinine Ratio: 7 (calc) (ref 6–22)
BUN: 4 mg/dL — ABNORMAL LOW (ref 7–25)
CO2: 25 mmol/L (ref 20–32)
Calcium: 9.6 mg/dL (ref 8.6–10.2)
Chloride: 104 mmol/L (ref 98–110)
Creat: 0.56 mg/dL (ref 0.50–0.96)
Globulin: 2.8 g/dL (calc) (ref 1.9–3.7)
Glucose, Bld: 90 mg/dL (ref 65–99)
Potassium: 3.8 mmol/L (ref 3.5–5.3)
Sodium: 139 mmol/L (ref 135–146)
Total Bilirubin: 0.3 mg/dL (ref 0.2–1.2)
Total Protein: 6.8 g/dL (ref 6.1–8.1)
eGFR: 128 mL/min/{1.73_m2} (ref >=60)

## 2022-06-08 NOTE — Progress Notes (Signed)
CBC and CMP WNL

## 2022-06-15 DIAGNOSIS — Z363 Encounter for antenatal screening for malformations: Secondary | ICD-10-CM | POA: Diagnosis not present

## 2022-06-15 DIAGNOSIS — Z361 Encounter for antenatal screening for raised alphafetoprotein level: Secondary | ICD-10-CM | POA: Diagnosis not present

## 2022-06-20 NOTE — L&D Delivery Note (Signed)
Delivery Note At 3:51 PM a viable female was delivered via Vaginal, Spontaneous (Presentation: Left Occiput Anterior).  APGAR: 8, 9; weight  .   Placenta status: Spontaneous, Intact.  Cord: 3 vessels with the following complications: None.  Cord pH: n/a  Anesthesia: Epidural Episiotomy: None Lacerations: 2nd degree Perineal AND R sulcal Suture Repair: 2.0 3.0 vicryl rapide Est. Blood Loss (mL): 200  Mom to postpartum.  Baby to Couplet care / Skin to Skin.  Lendon Colonel 11/07/2022, 4:28 PM

## 2022-06-27 ENCOUNTER — Other Ambulatory Visit: Payer: Self-pay | Admitting: Obstetrics & Gynecology

## 2022-07-19 ENCOUNTER — Other Ambulatory Visit: Payer: Self-pay | Admitting: Rheumatology

## 2022-07-19 NOTE — Telephone Encounter (Signed)
Next Visit: 09/12/2022  Last Visit: 06/07/2022  Last Fill: 04/19/2022  DX:Ankylosing spondylitis of sacral region   Current Dose per office note on 06/07/2022: Cimzia 400 mg sq injections every 28 days   Labs: 06/07/2022 CBC and CMP WNL   TB Gold: 03/07/2022 negative    Okay to refill cimzia?

## 2022-08-09 DIAGNOSIS — Z3689 Encounter for other specified antenatal screening: Secondary | ICD-10-CM | POA: Diagnosis not present

## 2022-08-29 NOTE — Progress Notes (Signed)
Office Visit Note  Patient: Toni Livingston             Date of Birth: 02-08-1996           MRN: [id number]             PCP: Ma Hillock, DO Referring: Ma Hillock, DO Visit Date: 09/12/2022 Occupation: @GUAROCC @  Subjective:  Medication monitoring   History of Present Illness: Toni Livingston is a 27 y.o. female with history of ankylosing spondylitis and crohn's disease.  Patient is currently on Cimzia 400 mg sq injections every 28 days, started September 21, 2021.  She continues to tolerate Cimzia without any side effects or injection site reactions.  She has not missed any doses recently.  She has been following up closely with her gynecologist while pregnant.  According to the patient her due date is currently scheduled in May 2024. Patient denies any eye inflammation recently.  She states that she has occasional discomfort in her SI joints which has been mild and self resolving.  She states that her SI joint pain is typically exacerbated by standing for prolonged periods of time while working.  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  Patient reports that overall her SI joint pain has been stable while pregnant.  She denies any Achilles tendinitis or plantar fasciitis.  She denies any joint swelling.  She remains under the care of Dr. Lorenso Courier for management of Crohn's.  She denies any blood in her stool recently.  She had inflammatory markers checked with lab work on 09/08/2022 and dropped off a stool sample today for fecal calprotectin testing today.      Activities of Daily Living:  Patient reports morning stiffness for 0  none .   Patient Denies nocturnal pain.  Difficulty dressing/grooming: Denies Difficulty climbing stairs: Denies Difficulty getting out of chair: Denies Difficulty using hands for taps, buttons, cutlery, and/or writing: Denies  Review of Systems  Constitutional:  Positive for fatigue.  HENT:  Negative for mouth sores and mouth  dryness.   Eyes:  Negative for dryness.  Respiratory:  Negative for shortness of breath.   Cardiovascular:  Negative for chest pain and palpitations.  Gastrointestinal:  Negative for blood in stool, constipation and diarrhea.  Endocrine: Negative for increased urination.  Genitourinary:  Negative for involuntary urination.  Musculoskeletal:  Negative for joint pain, gait problem, joint pain, joint swelling, myalgias, muscle weakness, morning stiffness, muscle tenderness and myalgias.  Skin:  Negative for color change, rash, hair loss and sensitivity to sunlight.  Allergic/Immunologic: Negative for susceptible to infections.  Neurological:  Negative for dizziness and headaches.  Hematological:  Negative for swollen glands.  Psychiatric/Behavioral:  Negative for depressed mood and sleep disturbance. The patient is not nervous/anxious.     PMFS History:  Patient Active Problem List   Diagnosis Date Noted   Ankylosing spondylitis of sacral region ([place]) 03/09/2021   Retinal edema 07/28/2020   Iridocyclitis associated with HLA-B27 positivity 07/28/2020   Congenital hypertrophy of retinal pigment epithelium of right eye 07/28/2020   IUD (intrauterine device) in place-Paraguard 04/2020 05/22/2020   HLA B27 (HLA B27 positive) 04/16/2019   ANA positive 04/16/2019   Iritis 04/10/2019   DDD (degenerative disc disease), lumbar 04/10/2019   Obesity (BMI 30-39.9) 11/14/2018   Pilonidal cyst 11/14/2018   Oral contraceptive use 07/27/2016   Encounter for long-term current use of medication 07/27/2016    Past Medical History:  Diagnosis Date  Ankylosing spondylitis (HCC)    Closed fracture of fifth metacarpal bone 07/19/2019   Crohn's disease ([place])    Iritis    LGSIL of cervix of undetermined significance 11/14/2018   Vaginal high risk HPV DNA test positive 11/19/2018   Vertigo     Family History  Problem Relation Age of Onset   Hypertension Mother    Colon polyps Father    Testicular  cancer Father    Healthy Sister    Depression Sister    Anxiety disorder Sister    Healthy Brother    Healthy Brother    Uterine cancer Maternal Aunt    Diabetes Maternal Grandmother    Emphysema Maternal Grandmother    Breast cancer Maternal Grandmother    Diabetes Maternal Grandfather    Esophageal cancer Neg Hx    Past Surgical History:  Procedure Laterality Date   BREAST REDUCTION SURGERY  2015   COLONOSCOPY  07/2021   HAND SURGERY Left 07/24/2019   Social History   Social History Narrative   Single. Some college.    Works as Therapist, nutritional.    Drinks caffeine.    Wears seatbelt. Smoke detector in the home.    Exercises routinely.    Feels safe in relationships.       Immunization History  Administered Date(s) Administered   Influenza,inj,Quad PF,6+ Mos 03/09/2017, 03/05/2018, 02/27/2019, 06/04/2021   Influenza-Unspecified 03/20/2016   PFIZER(Purple Top)SARS-COV-2 Vaccination 03/23/2020, 04/13/2020, 08/07/2020   PPD Test 07/27/2016   Tdap 07/27/2016     Objective: Vital Signs: BP 111/73 (BP Location: Left Arm, Patient Position: Sitting, Cuff Size: Normal)   Pulse 74   Resp 14   Ht 5\' 8"  (1.727 m)   Wt 232 lb (105.2 kg)   LMP 01/22/2022 (Exact Date)   BMI 35.28 kg/m    Physical Exam Vitals and nursing note reviewed.  Constitutional:      Appearance: She is well-developed.  HENT:     Head: Normocephalic and atraumatic.  Eyes:     Conjunctiva/sclera: Conjunctivae normal.  Cardiovascular:     Rate and Rhythm: Normal rate and regular rhythm.     Heart sounds: Normal heart sounds.  Pulmonary:     Effort: Pulmonary effort is normal.     Breath sounds: Normal breath sounds.  Abdominal:     General: Bowel sounds are normal.     Palpations: Abdomen is soft.  Musculoskeletal:     Cervical back: Normal range of motion.  Lymphadenopathy:     Cervical: No cervical adenopathy.  Skin:    General: Skin is warm and dry.     Capillary Refill: Capillary  refill takes less than 2 seconds.  Neurological:     Mental Status: She is alert and oriented to person, place, and time.  Psychiatric:        Behavior: Behavior normal.      Musculoskeletal Exam: C-spine, thoracic spine, lumbar spine have good range of motion.  No midline spinal tenderness or SI joint tenderness.  Shoulder joints, elbow joints, wrist joints, MCPs, PIPs, DIPs have good range of motion with no synovitis.  Complete fist formation bilaterally.  Hip joints have good range of motion with no groin pain.  Knee joints have good range of motion with no warmth or effusion.  Ankle joints have good range of motion with no tenderness or joint swelling.  No evidence of Achilles tendinitis.  CDAI Exam: CDAI Score: -- Patient Global: --; Provider Global: -- Swollen: --; Tender: -- Joint Exam  09/12/2022   No joint exam has been documented for this visit   There is currently no information documented on the homunculus. Go to the Rheumatology activity and complete the homunculus joint exam.  Investigation: No additional findings.  Imaging: No results found.  Recent Labs: Lab Results  Component Value Date   WBC 8.2 09/08/2022   HGB 12.9 09/08/2022   PLT 269.0 09/08/2022   NA 139 06/07/2022   K 3.8 06/07/2022   CL 104 06/07/2022   CO2 25 06/07/2022   GLUCOSE 90 06/07/2022   BUN 4 (L) 06/07/2022   CREATININE 0.56 06/07/2022   BILITOT 0.3 06/07/2022   ALKPHOS 109 12/25/2019   AST 17 06/07/2022   ALT 16 06/07/2022   PROT 6.8 06/07/2022   ALBUMIN 4.2 12/25/2019   CALCIUM 9.6 06/07/2022   GFRAA 141 10/06/2020   QFTBGOLDPLUS NEGATIVE 03/07/2022    Speciality Comments: humira- inadequate response Cimzia started September 29, 2021  Procedures:  No procedures performed Allergies: Patient has no known allergies.   Assessment / Plan:     Visit Diagnoses: Ankylosing spondylitis of sacral region [facility]) - History of chronic SI joint pain, SI joint sclerosis on the x-rays, HLA-B27  positive. SI joints injected on 05/27/2020: She is not currently exhibiting any signs or symptoms of a flare.  She has occasional discomfort in her SI joints which she describes as mild and self resolving.  Her symptoms are typically exacerbated by sitting for prolonged periods of time while working.  Overall her symptoms have been stable throughout pregnancy.  Her due date is currently scheduled for 11/07/2022.  She remains on Cimzia 400 mg subcutaneous injections every 28 days.  She continues to tolerate Cimzia without any side effects or injection site reactions.  She has not had any recurrent infections.  She has no synovitis or dactylitis on exam.  No evidence of Achilles tendinitis or plantar fasciitis.  She has not had any iridocyclitis flares.    She remains under the care of Dr. Lorenso Courier for management of Crohn's disease.  Fecal calprotectin ordered today.  ESR and sed rate were elevated on 09/08/22.  Dr. Lorenso Courier had discussed possibly increasing the frequency of Cimzia dosing if her results were consistent with active disease.  She was advised to notify us if the frequency of cimzia dosing is increased.  She will follow up in 3 months of sooner if needed.    High risk medication use - Cimzia 400 mg sq injections every 28 days, started September 21, 2021. CBC updated on 09/08/22.  CMP updated on 06/07/22.  Order for CMP released today. TB gold negative on 03/07/22 Discussed the importance of holding cimzia if she develops signs or symptoms of an infection and to resume once the infection has completely cleared.  - Plan: COMPLETE METABOLIC PANEL WITH GFR  Iridocyclitis - Dr. Manuella Ghazi.  Inadequate response to Humira.  She has not had any signs or symptoms of a flare.  HLA B27 positive  Crohn's disease of colon with other complication ([place]) - Fecal calprotectin 100-borderline inflammation on 03/10/22.  She remains on Cimzia 400 mg subcutaneous injections every 28 days.  Followed by Dr. Lorenso Courier.  She has not had  any blood in her stool recently but has had occasional bouts of diarrhea alternating with constipation.  Reviewed office visit note from 09/08/2022-updated CBC, CRP, sed rate, and Cimzia level and antibody level.  Patient dropped off a stool sample today for fecal calprotectin testing to be performed. There was discussion of  increasing the frequency of Cimzia dosing if there was signs of active disease.  Chronic SI joint pain: She experiences mild intermittent discomfort in her SI joints typically exacerbated by standing for prolonged periods of time while working.  She has no SI joint tenderness upon palpation today.  She has not needed to take any over-the-counter products for pain relief.  She has not required prednisone taper recently.  Overall her symptoms remain stable on Cimzia as prescribed.  She has not noticed any more frequent flares while pregnant.  Other medical conditions are listed as follows:  ANA positive  Other acne  Pilonidal cyst  Vaginal high risk HPV DNA test positive  Orders: Orders Placed This Encounter  Procedures   COMPLETE METABOLIC PANEL WITH GFR   No orders of the defined types were placed in this encounter.    Follow-Up Instructions: Return in about 3 months (around 12/13/2022) for Ankylosing Spondylitis.   Ofilia Neas, PA-C  Note - This record has been created using Dragon software.  Chart creation errors have been sought, but may not always  have been located. Such creation errors do not reflect on  the standard of medical care.

## 2022-08-30 DIAGNOSIS — H209 Unspecified iridocyclitis: Secondary | ICD-10-CM | POA: Diagnosis not present

## 2022-08-30 DIAGNOSIS — H3581 Retinal edema: Secondary | ICD-10-CM | POA: Diagnosis not present

## 2022-08-30 DIAGNOSIS — Q141 Congenital malformation of retina: Secondary | ICD-10-CM | POA: Diagnosis not present

## 2022-09-02 DIAGNOSIS — Z3689 Encounter for other specified antenatal screening: Secondary | ICD-10-CM | POA: Diagnosis not present

## 2022-09-02 DIAGNOSIS — Z23 Encounter for immunization: Secondary | ICD-10-CM | POA: Diagnosis not present

## 2022-09-05 LAB — OB RESULTS CONSOLE RPR: RPR: NONREACTIVE

## 2022-09-06 ENCOUNTER — Telehealth: Payer: Self-pay | Admitting: Pharmacist

## 2022-09-06 NOTE — Telephone Encounter (Signed)
Submitted a Prior Authorization renewal request to [facility] for Toni Livingston via CoverMyMeds. Will update once we receive a response.  Per automated response; [facility] has indicated that it is too soon to refill this medication at the pharmacy for your patient. If you need to renew an existing PA for your patient's medication, please reach out to [place] directly at [phone]  Will need to call pharmacy help desk to initiate PA over the phone  Knox Saliva, PharmD, MPH, BCPS, CPP Clinical Pharmacist (Rheumatology and Pulmonology)

## 2022-09-08 ENCOUNTER — Other Ambulatory Visit (INDEPENDENT_AMBULATORY_CARE_PROVIDER_SITE_OTHER): Payer: Federal, State, Local not specified - PPO

## 2022-09-08 ENCOUNTER — Encounter: Payer: Self-pay | Admitting: Internal Medicine

## 2022-09-08 ENCOUNTER — Ambulatory Visit (INDEPENDENT_AMBULATORY_CARE_PROVIDER_SITE_OTHER): Payer: Federal, State, Local not specified - PPO | Admitting: Internal Medicine

## 2022-09-08 VITALS — BP 118/78 | HR 90 | Ht 68.0 in | Wt 234.0 lb

## 2022-09-08 DIAGNOSIS — Z3A31 31 weeks gestation of pregnancy: Secondary | ICD-10-CM

## 2022-09-08 DIAGNOSIS — R197 Diarrhea, unspecified: Secondary | ICD-10-CM

## 2022-09-08 DIAGNOSIS — K50019 Crohn's disease of small intestine with unspecified complications: Secondary | ICD-10-CM

## 2022-09-08 LAB — CBC WITH DIFFERENTIAL/PLATELET
Basophils Absolute: 0.1 10*3/uL (ref 0.0–0.1)
Basophils Relative: 0.6 % (ref 0.0–3.0)
Eosinophils Absolute: 0.1 10*3/uL (ref 0.0–0.7)
Eosinophils Relative: 1.7 % (ref 0.0–5.0)
HCT: 37.8 % (ref 36.0–46.0)
Hemoglobin: 12.9 g/dL (ref 12.0–15.0)
Lymphocytes Relative: 19 % (ref 12.0–46.0)
Lymphs Abs: 1.5 10*3/uL (ref 0.7–4.0)
MCHC: 34.2 g/dL (ref 30.0–36.0)
MCV: 86.6 fl (ref 78.0–100.0)
Monocytes Absolute: 0.7 10*3/uL (ref 0.1–1.0)
Monocytes Relative: 8 % (ref 3.0–12.0)
Neutro Abs: 5.8 10*3/uL (ref 1.4–7.7)
Neutrophils Relative %: 70.7 % (ref 43.0–77.0)
Platelets: 269 10*3/uL (ref 150.0–400.0)
RBC: 4.37 Mil/uL (ref 3.87–5.11)
RDW: 14.3 % (ref 11.5–15.5)
WBC: 8.2 10*3/uL (ref 4.0–10.5)

## 2022-09-08 LAB — SEDIMENTATION RATE: Sed Rate: 36 mm/hr — ABNORMAL HIGH (ref 0–20)

## 2022-09-08 LAB — HIGH SENSITIVITY CRP: CRP, High Sensitivity: 18.51 mg/L — ABNORMAL HIGH (ref 0.000–5.000)

## 2022-09-08 NOTE — Patient Instructions (Signed)
Your provider has ordered "[facility]" stool testing for you. You have received a kit from our office today containing all necessary supplies to complete this test. Please carefully read the stool collection instructions provided in the kit before opening the accompanying materials. In addition, be sure to place the label from the top left corner of the laboratory request sheet onto the "puritan opti-swab" tube that is supplied in the kit. This label should include your full name and date of birth. After completing the test, you should secure the purtian tube into the specimen biohazard bag. The laboratory request information sheet (including date and time of specimen collection) should be placed into the outside pocket of the specimen biohazard bag and returned to the [facility] lab with 2 days of collection.    Your provider has requested that you go to the basement level for lab work before leaving today. Press "B" on the elevator. The lab is located at the first door on the left as you exit the elevator.   _______________________________________________________  If your blood pressure at your visit was 140/90 or greater, please contact your primary care physician to follow up on this.  _______________________________________________________  If you are age 70 or older, your body mass index should be between 23-30. Your Body mass index is 35.58 kg/m. If this is out of the aforementioned range listed, please consider follow up with your Primary Care Provider.  If you are age 67 or younger, your body mass index should be between 19-25. Your Body mass index is 35.58 kg/m. If this is out of the aformentioned range listed, please consider follow up with your Primary Care Provider.   ________________________________________________________  The [facility] providers would like to encourage you to use [facility] to communicate with providers for non-urgent requests or questions.  Due to long hold times on the  telephone, sending your provider a message by [facility] may be a faster and more efficient way to get a response.  Please allow 48 business hours for a response.  Please remember that this is for non-urgent requests.  _______________________________________________________ .Marland Kitchen  Due to recent changes in healthcare laws, you may see the results of your imaging and laboratory studies on MyChart before your provider has had a chance to review them.  We understand that in some cases there may be results that are confusing or concerning to you. Not all laboratory results come back in the same time frame and the provider may be waiting for multiple results in order to interpret others.  Please give [place] 48 hours in order for your provider to thoroughly review all the results before contacting the office for clarification of your results.    Thank you for entrusting me with your care and for choosing [facility], Dr. Christia Reading

## 2022-09-08 NOTE — Progress Notes (Signed)
Chief Complaint: Diarrhea and positive p-ANCA  HPI:    Toni Livingston is a 27 year old female with history of ileal Crohn's disease and ankylosing spondylitis presents for follow up of Crohn's disease  Interval History: She is currently [redacted] weeks pregnant. Her due date is on 5/20. She has been doing well on Cimzia and continues to take this every 4 weeks. Last dose of Cimzia was 3/13. She does feel like her BMs have been more frequent. A couple of weeks ago, she had constipation and started taking a stool softener. Then 1.5 weeks ago, she started having more frequent BMs.  Since then, she has been having 3-4 BMs per day. Denies nocturnal stools. Denies blood in stools. Denies fevers. Denies sick contacts. Denies abdominal pain. She is eating and drinking well.  Her OB/GYN visits have been going well.  Current Outpatient Medications  Medication Sig Dispense Refill   CIMZIA 2 X 200 MG/ML PSKT prefilled syringe INJECT 2 SYRINGES UNDER THE SKIN EVERY 4 WEEKS 1 each 2   prednisoLONE acetate (PRED FORTE) 1 % ophthalmic suspension as needed.     Prenatal Vit-Fe Fumarate-FA (M-NATAL PLUS) 27-1 MG TABS Take 1 tablet by mouth daily. 30 tablet 3   triamcinolone cream (KENALOG) 0.1 % Apply 1 application. topically 2 (two) times daily as needed. 80 g 3   No current facility-administered medications for this visit.     Physical Exam:  Vital signs: BP 118/78   Pulse 90   Ht 5\' 8"  (1.727 m)   Wt 234 lb (106.1 kg)   LMP 01/22/2022 (Exact Date)   SpO2 98%   BMI 35.58 kg/m   Constitutional:   Pleasant Caucasian female appears to be in NAD, Well developed, Well nourished, alert and cooperative Respiratory: Respirations even and unlabored. Lungs clear to auscultation bilaterally.   No wheezes, crackles, or rhonchi.  Cardiovascular: Normal S1, S2. No MRG. Regular rate and rhythm. No peripheral edema, cyanosis or pallor.  Gastrointestinal:  Soft, gravid uterus, non-tender Psychiatric:  Demonstrates good  judgement and reason without abnormal affect or behaviors.  RELEVANT LABS AND IMAGING: CBC    Component Value Date/Time   WBC 9.8 06/07/2022 1337   RBC 4.70 06/07/2022 1337   HGB 13.4 06/07/2022 1337   HCT 39.3 06/07/2022 1337   PLT 335 06/07/2022 1337   MCV 83.6 06/07/2022 1337   MCH 28.5 06/07/2022 1337   MCHC 34.1 06/07/2022 1337   RDW 14.3 06/07/2022 1337   LYMPHSABS 1,891 06/07/2022 1337   MONOABS 0.4 09/13/2017 1002   EOSABS 216 06/07/2022 1337   BASOSABS 20 06/07/2022 1337    CMP     Component Value Date/Time   NA 139 06/07/2022 1337   K 3.8 06/07/2022 1337   CL 104 06/07/2022 1337   CO2 25 06/07/2022 1337   GLUCOSE 90 06/07/2022 1337   BUN 4 (L) 06/07/2022 1337   CREATININE 0.56 06/07/2022 1337   CALCIUM 9.6 06/07/2022 1337   PROT 6.8 06/07/2022 1337   ALBUMIN 4.2 12/25/2019 1518   AST 17 06/07/2022 1337   ALT 16 06/07/2022 1337   ALKPHOS 109 12/25/2019 1518   BILITOT 0.3 06/07/2022 1337   GFRNONAA 122 10/06/2020 1132   GFRAA 141 10/06/2020 1132   Labs 08/2021: Humira level <0.6. Humira antibody level of 3820.   Labs 11/2021: CBC and CMP unremarkable.  Labs 02/2022: CBC and CMP unremarkable. Fecal calprotectin 100 mg (borderline). Quant gold negative. ESR mildly elevated at 21.   Labs 05/2022: CBC and CMP  unremarkable  CT enterography 09/06/21: IMPRESSION: 1. There is short segment circumferential wall thickening and mucosal hyperenhancement of the terminal ileum, involving a segment approximately 4 cm in length from the ileocecal valve. This appearance is in keeping with Crohn's ileitis. No evidence of complicating stricture, fistula, or abscess at this time. No other evidence of bowel inflammation. 2. IUD is present in the uterus, although in an abnormal appearing low lying position, within the lower uterine segment and or endocervical canal. Consider pelvic ultrasound to assess appropriate positioning. 3. No osseous stigmata of ankylosing spondylitis by  CT.  Colonoscopy 08/16/21: - Inflammed and strictured terminal ileum, otherwise the examination was normal. - Biopsies taken from terminal ileum, right and left colon. - The examination was otherwise normal on direct and retroflexion views. Path: 1. Surgical [P], small bowel, terminal ileum - SEVERELY ACTIVE CHRONIC, NONSPECIFIC ILEITIS - NO GRANULOMAS, DYSPLASIA OR MALIGNANCY IDENTIFIED - SEE COMMENT 2. Surgical [P], right colon biopsy - BENIGN COLONIC MUCOSA - NO ACTIVE INFLAMMATION OR EVIDENCE OF MICROSCOPIC COLITIS - NO HIGH-GRADE DYSPLASIA OR MALIGNANCY IDENTIFIED 3. Surgical [P], left colon biopsy - BENIGN COLONIC MUCOSA - NO ACTIVE INFLAMMATION OR EVIDENCE OF MICROSCOPIC COLITIS - NO HIGH-GRADE DYSPLASIA OR MALIGNANCY IDENTIFIED Microscopic Comment 1. The biopsy is scant and not typical of inflammatory bowel disease. The differential diagnosis would include drugs, infection and inflammatory bowel disease. Radiologic correlation and patient follow-up is suggested. Dr. Vic Ripper reviewed the case and agrees with the above diagnosis.  Assessment: Crohn's disease Diarrhea Pregnancy Patient presents for follow up of ileal Crohn's disease. She has been doing well on Cimzia since 09/2021.  The last time I checked her Cimzia levels in 02/2022, she did have adequate Cimzia levels along with some antibody development.  Will recheck her Cimzia levels and antibodies today to see if these antibodies are increasing.  If her antibody level is increasing, will plan to either increase her Cimzia dose or frequency.  Patient did describe some issues with diarrhea today so we will recheck her inflammatory markers and fecal calprotectin.  Will also rule out underlying GI infection with stool studies.  Her pregnancy has been going well.  Plan: - Continue low FODMAP diet - Check CBC, CRP, ESR, Cimzia level and antibody - Check fecal calprotectin, [facility] GI path panel with C dif - RTC in 3 months  I  spent 41 minutes of time, including in depth chart review, independent review of results as outlined above, communicating results with the patient directly, face-to-face time with the patient, coordinating care, ordering studies and medications as appropriate, and documentation.

## 2022-09-09 NOTE — Telephone Encounter (Signed)
Called [employer] to initiate Cimzia PA renewal over the phone. Rep will fax non-formulary exception request form to our clinic as Cimzia is not on their formulary.  Phone # [phone]  Cimzia level and antibody was checked yesterday by GI - still pending  Knox Saliva, PharmD, MPH, BCPS, CPP Clinical Pharmacist (Rheumatology and Pulmonology)

## 2022-09-12 ENCOUNTER — Encounter: Payer: Self-pay | Admitting: Physician Assistant

## 2022-09-12 ENCOUNTER — Ambulatory Visit: Payer: Federal, State, Local not specified - PPO | Attending: Physician Assistant | Admitting: Physician Assistant

## 2022-09-12 ENCOUNTER — Other Ambulatory Visit: Payer: Federal, State, Local not specified - PPO

## 2022-09-12 VITALS — BP 111/73 | HR 74 | Resp 14 | Ht 68.0 in | Wt 232.0 lb

## 2022-09-12 DIAGNOSIS — M458 Ankylosing spondylitis sacral and sacrococcygeal region: Secondary | ICD-10-CM

## 2022-09-12 DIAGNOSIS — R197 Diarrhea, unspecified: Secondary | ICD-10-CM | POA: Diagnosis not present

## 2022-09-12 DIAGNOSIS — K50019 Crohn's disease of small intestine with unspecified complications: Secondary | ICD-10-CM

## 2022-09-12 DIAGNOSIS — G8929 Other chronic pain: Secondary | ICD-10-CM

## 2022-09-12 DIAGNOSIS — Z3A31 31 weeks gestation of pregnancy: Secondary | ICD-10-CM

## 2022-09-12 DIAGNOSIS — Z1589 Genetic susceptibility to other disease: Secondary | ICD-10-CM

## 2022-09-12 DIAGNOSIS — Z79899 Other long term (current) drug therapy: Secondary | ICD-10-CM | POA: Diagnosis not present

## 2022-09-12 DIAGNOSIS — H209 Unspecified iridocyclitis: Secondary | ICD-10-CM

## 2022-09-12 DIAGNOSIS — M533 Sacrococcygeal disorders, not elsewhere classified: Secondary | ICD-10-CM

## 2022-09-12 DIAGNOSIS — R87811 Vaginal high risk human papillomavirus (HPV) DNA test positive: Secondary | ICD-10-CM

## 2022-09-12 DIAGNOSIS — L0591 Pilonidal cyst without abscess: Secondary | ICD-10-CM

## 2022-09-12 DIAGNOSIS — R7689 Other specified abnormal immunological findings in serum: Secondary | ICD-10-CM

## 2022-09-12 DIAGNOSIS — K50118 Crohn's disease of large intestine with other complication: Secondary | ICD-10-CM

## 2022-09-12 DIAGNOSIS — R768 Other specified abnormal immunological findings in serum: Secondary | ICD-10-CM

## 2022-09-12 DIAGNOSIS — L708 Other acne: Secondary | ICD-10-CM

## 2022-09-12 NOTE — Telephone Encounter (Signed)
Form received for formulary exception for Cimzia. Completed and faxed with clinicals  Fax: [phone] Phone: [phone]  Knox Saliva, PharmD, MPH, BCPS, CPP Clinical Pharmacist (Rheumatology and Pulmonology)

## 2022-09-13 LAB — COMPLETE METABOLIC PANEL WITH GFR
AG Ratio: 1.3 (calc) (ref 1.0–2.5)
ALT: 16 U/L (ref 6–29)
AST: 14 U/L (ref 10–30)
Albumin: 3.7 g/dL (ref 3.6–5.1)
Alkaline phosphatase (APISO): 120 U/L (ref 31–125)
BUN/Creatinine Ratio: 10 (calc) (ref 6–22)
BUN: 5 mg/dL — ABNORMAL LOW (ref 7–25)
CO2: 23 mmol/L (ref 20–32)
Calcium: 8.8 mg/dL (ref 8.6–10.2)
Chloride: 104 mmol/L (ref 98–110)
Creat: 0.49 mg/dL — ABNORMAL LOW (ref 0.50–0.96)
Globulin: 2.9 g/dL (calc) (ref 1.9–3.7)
Glucose, Bld: 65 mg/dL (ref 65–99)
Potassium: 3.7 mmol/L (ref 3.5–5.3)
Sodium: 137 mmol/L (ref 135–146)
Total Bilirubin: 0.3 mg/dL (ref 0.2–1.2)
Total Protein: 6.6 g/dL (ref 6.1–8.1)
eGFR: 132 mL/min/{1.73_m2} (ref >=60)

## 2022-09-13 NOTE — Progress Notes (Signed)
Creatinine is borderline low. Not of concern at this time. Rest of CMP WNL.

## 2022-09-14 ENCOUNTER — Encounter: Payer: Self-pay | Admitting: Internal Medicine

## 2022-09-14 NOTE — Progress Notes (Signed)
Received results from [facility] GI pathogen panel. Stool test was positive for enteropathogenic E coli (EPEC). C dif was negative. Will have lab report scanned into her chart.  Beth, please let the patient know that she has an E coli stool infection, which likely explains why her stool frequency has increased. This infection should resolved on its own, but if she develops more severe symptoms, she should let us know.

## 2022-09-14 NOTE — Progress Notes (Signed)
Patient notified of results via mychart

## 2022-09-15 NOTE — Telephone Encounter (Signed)
Received PA request form from [place] for Cimzia. Completed form and faxed back  Fax: [phone] Phone: [phone]  Knox Saliva, PharmD, MPH, BCPS, CPP Clinical Pharmacist (Rheumatology and Pulmonology)

## 2022-09-16 LAB — CALPROTECTIN, FECAL: Calprotectin, Fecal: 197 ug/g — ABNORMAL HIGH (ref 0–120)

## 2022-09-16 NOTE — Telephone Encounter (Signed)
Received notification from [facility] regarding a prior authorization for [facility]. Authorization has been APPROVED from 09/16/22 to 03/14/24. Approval letter sent to scan center.  Patient must continue to fill through [facility]: [phone]  Knox Saliva, PharmD, MPH, BCPS, CPP Clinical Pharmacist (Rheumatology and Pulmonology)

## 2022-09-20 LAB — CERTOLIZUMAB AND ANTI-CERTO AB
Anti-Certolizumab Ab Level: 246 ng/mL
Certolizumab DRUG Level: 39 ug/mL

## 2022-09-20 LAB — SERIAL MONITORING

## 2022-09-22 DIAGNOSIS — M6281 Muscle weakness (generalized): Secondary | ICD-10-CM | POA: Diagnosis not present

## 2022-09-30 DIAGNOSIS — M6281 Muscle weakness (generalized): Secondary | ICD-10-CM | POA: Diagnosis not present

## 2022-10-03 DIAGNOSIS — M6281 Muscle weakness (generalized): Secondary | ICD-10-CM | POA: Diagnosis not present

## 2022-10-07 DIAGNOSIS — M6281 Muscle weakness (generalized): Secondary | ICD-10-CM | POA: Diagnosis not present

## 2022-10-10 DIAGNOSIS — M6281 Muscle weakness (generalized): Secondary | ICD-10-CM | POA: Diagnosis not present

## 2022-10-12 ENCOUNTER — Other Ambulatory Visit: Payer: Self-pay | Admitting: Physician Assistant

## 2022-10-12 DIAGNOSIS — M6281 Muscle weakness (generalized): Secondary | ICD-10-CM | POA: Diagnosis not present

## 2022-10-12 NOTE — Telephone Encounter (Signed)
Last Fill: 07/19/2022  Labs: 09/12/2022 Creatinine is borderline low. Not of concern at this time. Rest of CMP WNL.   TB Gold: 03/07/2022 Neg    Next Visit: 12/13/2022  Last Visit: 09/12/2022  DX: Ankylosing spondylitis of sacral region   Current Dose per office note 09/12/2022: Cimzia 400 mg subcutaneous injections every 28 days   Okay to refill Cimzia?

## 2022-10-13 DIAGNOSIS — Z3685 Encounter for antenatal screening for Streptococcus B: Secondary | ICD-10-CM | POA: Diagnosis not present

## 2022-10-13 LAB — OB RESULTS CONSOLE GBS: GBS: NEGATIVE

## 2022-10-17 DIAGNOSIS — M6281 Muscle weakness (generalized): Secondary | ICD-10-CM | POA: Diagnosis not present

## 2022-10-19 DIAGNOSIS — M6281 Muscle weakness (generalized): Secondary | ICD-10-CM | POA: Diagnosis not present

## 2022-10-25 DIAGNOSIS — Z3A38 38 weeks gestation of pregnancy: Secondary | ICD-10-CM | POA: Diagnosis not present

## 2022-10-25 DIAGNOSIS — O26849 Uterine size-date discrepancy, unspecified trimester: Secondary | ICD-10-CM | POA: Diagnosis not present

## 2022-10-27 DIAGNOSIS — M6281 Muscle weakness (generalized): Secondary | ICD-10-CM | POA: Diagnosis not present

## 2022-11-01 DIAGNOSIS — M6281 Muscle weakness (generalized): Secondary | ICD-10-CM | POA: Diagnosis not present

## 2022-11-07 ENCOUNTER — Inpatient Hospital Stay (HOSPITAL_COMMUNITY): Payer: Federal, State, Local not specified - PPO | Admitting: Anesthesiology

## 2022-11-07 ENCOUNTER — Encounter (HOSPITAL_COMMUNITY): Payer: Self-pay | Admitting: Obstetrics and Gynecology

## 2022-11-07 ENCOUNTER — Other Ambulatory Visit: Payer: Self-pay

## 2022-11-07 ENCOUNTER — Inpatient Hospital Stay (HOSPITAL_COMMUNITY)
Admission: AD | Admit: 2022-11-07 | Discharge: 2022-11-09 | DRG: 807 | Disposition: A | Discharge status: Home / Self Care | Payer: Federal, State, Local not specified - PPO | Attending: Obstetrics | Admitting: Obstetrics

## 2022-11-07 DIAGNOSIS — O99214 Obesity complicating childbirth: Secondary | ICD-10-CM | POA: Diagnosis not present

## 2022-11-07 DIAGNOSIS — Z3A40 40 weeks gestation of pregnancy: Secondary | ICD-10-CM | POA: Diagnosis not present

## 2022-11-07 DIAGNOSIS — R030 Elevated blood-pressure reading, without diagnosis of hypertension: Secondary | ICD-10-CM | POA: Diagnosis not present

## 2022-11-07 DIAGNOSIS — O26893 Other specified pregnancy related conditions, third trimester: Principal | ICD-10-CM | POA: Diagnosis present

## 2022-11-07 DIAGNOSIS — Z23 Encounter for immunization: Secondary | ICD-10-CM | POA: Diagnosis not present

## 2022-11-07 DIAGNOSIS — E669 Obesity, unspecified: Secondary | ICD-10-CM | POA: Diagnosis not present

## 2022-11-07 DIAGNOSIS — H02843 Edema of right eye, unspecified eyelid: Secondary | ICD-10-CM | POA: Diagnosis not present

## 2022-11-07 LAB — COMPREHENSIVE METABOLIC PANEL
ALT: 17 U/L (ref 0–44)
AST: 17 U/L (ref 15–41)
Albumin: 3.2 g/dL — ABNORMAL LOW (ref 3.5–5.0)
Alkaline Phosphatase: 144 U/L — ABNORMAL HIGH (ref 38–126)
Anion gap: 13 (ref 5–15)
BUN: 7 mg/dL (ref 6–20)
CO2: 17 mmol/L — ABNORMAL LOW (ref 22–32)
Calcium: 8.9 mg/dL (ref 8.9–10.3)
Chloride: 101 mmol/L (ref 98–111)
Creatinine, Ser: 0.67 mg/dL (ref 0.44–1.00)
GFR, Estimated: >60 mL/min (ref >60)
Glucose, Bld: 93 mg/dL (ref 70–99)
Potassium: 3.2 mmol/L — ABNORMAL LOW (ref 3.5–5.1)
Sodium: 131 mmol/L — ABNORMAL LOW (ref 135–145)
Total Bilirubin: 0.7 mg/dL (ref 0.3–1.2)
Total Protein: 6.8 g/dL (ref 6.5–8.1)

## 2022-11-07 LAB — PROTEIN / CREATININE RATIO, URINE
Creatinine, Urine: 100 mg/dL
Protein Creatinine Ratio: 0.12 mg/mg{Cre} (ref 0.00–0.15)
Total Protein, Urine: 12 mg/dL

## 2022-11-07 LAB — CBC
HCT: 42.3 % (ref 36.0–46.0)
HCT: 40 % (ref 36.0–46.0)
Hemoglobin: 14.4 g/dL (ref 12.0–15.0)
Hemoglobin: 13.5 g/dL (ref 12.0–15.0)
MCH: 29.6 pg (ref 26.0–34.0)
MCH: 29.2 pg (ref 26.0–34.0)
MCHC: 34 g/dL (ref 30.0–36.0)
MCHC: 33.8 g/dL (ref 30.0–36.0)
MCV: 87 fL (ref 80.0–100.0)
MCV: 86.4 fL (ref 80.0–100.0)
Platelets: 252 10*3/uL (ref 150–400)
Platelets: 198 10*3/uL (ref 150–400)
RBC: 4.86 MIL/uL (ref 3.87–5.11)
RBC: 4.63 MIL/uL (ref 3.87–5.11)
RDW: 14 % (ref 11.5–15.5)
RDW: 14 % (ref 11.5–15.5)
WBC: 13.1 10*3/uL — ABNORMAL HIGH (ref 4.0–10.5)
WBC: 19.8 10*3/uL — ABNORMAL HIGH (ref 4.0–10.5)
nRBC: 0 % (ref 0.0–0.2)
nRBC: 0 % (ref 0.0–0.2)

## 2022-11-07 LAB — RPR: RPR Ser Ql: NONREACTIVE

## 2022-11-07 LAB — TYPE AND SCREEN
ABO/RH(D): A POS
Antibody Screen: NEGATIVE

## 2022-11-07 MED ORDER — WITCH HAZEL-GLYCERIN EX PADS: 1.0000 | MEDICATED_PAD | CUTANEOUS | Status: DC | PRN

## 2022-11-07 MED ORDER — FENTANYL CITRATE (PF) 100 MCG/2ML IJ SOLN
100.0000 ug | INTRAMUSCULAR | Status: DC | PRN
  Administered 2022-11-07 (×2): 100 ug via INTRAVENOUS
  Filled 2022-11-07 (×2): qty 2

## 2022-11-07 MED ORDER — LACTATED RINGERS IV SOLN: 500.0000 mL | Freq: Once | INTRAVENOUS | Status: DC

## 2022-11-07 MED ORDER — ONDANSETRON HCL 4 MG/2ML IJ SOLN
4.0000 mg | Freq: Four times a day (QID) | INTRAMUSCULAR | Status: DC | PRN
  Administered 2022-11-07: 4 mg via INTRAVENOUS
  Filled 2022-11-07: qty 2

## 2022-11-07 MED ORDER — ACETAMINOPHEN 325 MG PO TABS: 650.0000 mg | ORAL_TABLET | ORAL | Status: DC | PRN

## 2022-11-07 MED ORDER — OXYTOCIN-SODIUM CHLORIDE 30-0.9 UT/500ML-% IV SOLN
2.5000 [IU]/h | INTRAVENOUS | Status: DC
  Filled 2022-11-07: qty 500

## 2022-11-07 MED ORDER — EPHEDRINE 5 MG/ML INJ: 10.0000 mg | INTRAVENOUS | Status: DC | PRN

## 2022-11-07 MED ORDER — PHENYLEPHRINE 80 MCG/ML (10ML) SYRINGE FOR IV PUSH (FOR BLOOD PRESSURE SUPPORT): 80.0000 ug | PREFILLED_SYRINGE | INTRAVENOUS | Status: DC | PRN

## 2022-11-07 MED ORDER — OXYTOCIN-SODIUM CHLORIDE 30-0.9 UT/500ML-% IV SOLN: 1.0000 m[IU]/min | INTRAVENOUS | Status: DC

## 2022-11-07 MED ORDER — ONDANSETRON HCL 4 MG/2ML IJ SOLN: 4.0000 mg | INTRAMUSCULAR | Status: DC | PRN

## 2022-11-07 MED ORDER — LACTATED RINGERS IV SOLN
INTRAVENOUS | Status: DC
  Administered 2022-11-07: 1000 mL via INTRAVENOUS

## 2022-11-07 MED ORDER — IBUPROFEN 600 MG PO TABS
600.0000 mg | ORAL_TABLET | Freq: Four times a day (QID) | ORAL | Status: DC
  Administered 2022-11-07 – 2022-11-09 (×8): 600 mg via ORAL
  Filled 2022-11-07 (×8): qty 1

## 2022-11-07 MED ORDER — COCONUT OIL OIL: 1.0000 | TOPICAL_OIL | Status: DC | PRN

## 2022-11-07 MED ORDER — TETANUS-DIPHTH-ACELL PERTUSSIS 5-2.5-18.5 LF-MCG/0.5 IM SUSY: 0.5000 mL | PREFILLED_SYRINGE | Freq: Once | INTRAMUSCULAR | Status: DC

## 2022-11-07 MED ORDER — OXYCODONE HCL 5 MG PO TABS: 5.0000 mg | ORAL_TABLET | ORAL | Status: DC | PRN

## 2022-11-07 MED ORDER — SENNOSIDES-DOCUSATE SODIUM 8.6-50 MG PO TABS
2.0000 | ORAL_TABLET | ORAL | Status: DC
  Administered 2022-11-08 – 2022-11-09 (×2): 2 via ORAL
  Filled 2022-11-07 (×2): qty 2

## 2022-11-07 MED ORDER — DIBUCAINE (PERIANAL) 1 % EX OINT: 1.0000 | TOPICAL_OINTMENT | CUTANEOUS | Status: DC | PRN

## 2022-11-07 MED ORDER — ZOLPIDEM TARTRATE 5 MG PO TABS: 5.0000 mg | ORAL_TABLET | Freq: Every evening | ORAL | Status: DC | PRN

## 2022-11-07 MED ORDER — FLEET ENEMA 7-19 GM/118ML RE ENEM: 1.0000 | ENEMA | RECTAL | Status: DC | PRN

## 2022-11-07 MED ORDER — FENTANYL-BUPIVACAINE-NACL 0.5-0.125-0.9 MG/250ML-% EP SOLN
12.0000 mL/h | EPIDURAL | Status: DC | PRN
  Administered 2022-11-07: 12 mL/h via EPIDURAL
  Filled 2022-11-07: qty 250

## 2022-11-07 MED ORDER — PRENATAL MULTIVITAMIN CH
1.0000 | ORAL_TABLET | Freq: Every day | ORAL | Status: DC
  Administered 2022-11-08 – 2022-11-09 (×2): 1 via ORAL
  Filled 2022-11-07 (×2): qty 1

## 2022-11-07 MED ORDER — TERBUTALINE SULFATE 1 MG/ML IJ SOLN: 0.2500 mg | Freq: Once | INTRAMUSCULAR | Status: DC | PRN

## 2022-11-07 MED ORDER — OXYCODONE-ACETAMINOPHEN 5-325 MG PO TABS: 1.0000 | ORAL_TABLET | ORAL | Status: DC | PRN

## 2022-11-07 MED ORDER — SIMETHICONE 80 MG PO CHEW: 80.0000 mg | CHEWABLE_TABLET | ORAL | Status: DC | PRN

## 2022-11-07 MED ORDER — DIPHENHYDRAMINE HCL 50 MG/ML IJ SOLN: 12.5000 mg | INTRAMUSCULAR | Status: DC | PRN

## 2022-11-07 MED ORDER — OXYTOCIN BOLUS FROM INFUSION
333.0000 mL | Freq: Once | INTRAVENOUS | Status: AC
  Administered 2022-11-07: 333 mL via INTRAVENOUS

## 2022-11-07 MED ORDER — LACTATED RINGERS IV SOLN: 500.0000 mL | INTRAVENOUS | Status: DC | PRN

## 2022-11-07 MED ORDER — DIPHENHYDRAMINE HCL 25 MG PO CAPS: 25.0000 mg | ORAL_CAPSULE | Freq: Four times a day (QID) | ORAL | Status: DC | PRN

## 2022-11-07 MED ORDER — LIDOCAINE HCL (PF) 1 % IJ SOLN
INTRAMUSCULAR | Status: DC | PRN
  Administered 2022-11-07: 3 mL via EPIDURAL
  Administered 2022-11-07: 2 mL via EPIDURAL
  Administered 2022-11-07: 5 mL via EPIDURAL

## 2022-11-07 MED ORDER — ONDANSETRON HCL 4 MG PO TABS: 4.0000 mg | ORAL_TABLET | ORAL | Status: DC | PRN

## 2022-11-07 MED ORDER — BENZOCAINE-MENTHOL 20-0.5 % EX AERO: 1.0000 | INHALATION_SPRAY | CUTANEOUS | Status: DC | PRN

## 2022-11-07 MED ORDER — LIDOCAINE HCL (PF) 1 % IJ SOLN: 30.0000 mL | INTRAMUSCULAR | Status: DC | PRN

## 2022-11-07 MED ORDER — OXYCODONE-ACETAMINOPHEN 5-325 MG PO TABS: 2.0000 | ORAL_TABLET | ORAL | Status: DC | PRN

## 2022-11-07 MED ORDER — SOD CITRATE-CITRIC ACID 500-334 MG/5ML PO SOLN: 30.0000 mL | ORAL | Status: DC | PRN

## 2022-11-07 MED ORDER — OXYCODONE HCL 5 MG PO TABS: 10.0000 mg | ORAL_TABLET | ORAL | Status: DC | PRN

## 2022-11-07 NOTE — Progress Notes (Signed)
S: Doing well, no complaints, pain well controlled with epidural  O: BP 132/88   Pulse (!) 109   Temp 98.8 F (37.1 C) (Oral)   Resp 18   Ht 5\' 8"  (1.727 m)   Wt 107.3 kg   LMP 01/22/2022 (Exact Date)   SpO2 96%   BMI 35.96 kg/m    FHT:  FHR: 150 bpm, variability: moderate,  accelerations:  Present,  decelerations:  Present early UC:   regular, every 2-4 minutes SVE:   Dilation: 8 Effacement (%): 100 Station: -1 Exam by:: stone rnc   A / P:  27 y.o.  OB History  Gravida Para Term Preterm AB Living  1 0 0 0 0 0  SAB IAB Ectopic Multiple Live Births  0 0 0 0 0   at [redacted]w[redacted]d Augmentation of labor. Pitocin, IUPC in place -elevated bps earlier in labor, no labs/ sx to suggest PEC, will cont to monitor.   Fetal Wellbeing:  Category I Pain Control:  Epidural  Anticipated MOD:  NSVD  Lendon Colonel 11/07/2022, 1:39 PM

## 2022-11-07 NOTE — MAU Note (Signed)
.  Toni Livingston is a 27 y.o. at [redacted]w[redacted]d here in MAU reporting:   Contractions every: 5 minutes Onset of ctx: Yesterday Pain score: 8/10  ROM: Intact Vaginal Bleeding: Bloody show Last SVE: 0  Epidural: Planning  Fetal Movement: Reports positive FM FHT:140 via External  Vitals:   11/07/22 0154  BP: 137/81  Pulse: 98  Resp: 20  Temp: 98.6 F (37 C)  SpO2: 100%       OB Office: [place] GBS: Negative HSV: Denies hx of HSV Lab orders placed from triage: MAU Labor Eval

## 2022-11-07 NOTE — Anesthesia Procedure Notes (Signed)
Epidural Patient location during procedure: OB Start time: 11/07/2022 7:46 AM End time: 11/07/2022 7:53 AM  Staffing Anesthesiologist: Collene Schlichter, MD Performed: anesthesiologist   Preanesthetic Checklist Completed: patient identified, IV checked, risks and benefits discussed, monitors and equipment checked, pre-op evaluation and timeout performed  Epidural Patient position: sitting Prep: DuraPrep Patient monitoring: blood pressure and continuous pulse ox Approach: midline Location: L3-L4 Injection technique: LOR air  Needle:  Needle type: Tuohy  Needle gauge: 17 G Needle length: 9 cm Needle insertion depth: 6 cm Catheter size: 19 Gauge Catheter at skin depth: 11 cm Test dose: negative and Other (1% Lidocaine)  Additional Notes Patient identified.  Risk benefits discussed including failed block, incomplete pain control, headache, nerve damage, paralysis, blood pressure changes, nausea, vomiting, reactions to medication both toxic or allergic, and postpartum back pain.  Patient expressed understanding and wished to proceed.  All questions were answered.  Sterile technique used throughout procedure and epidural site dressed with sterile barrier dressing. No paresthesia or other complications noted. The patient did not experience any signs of intravascular injection such as tinnitus or metallic taste in mouth nor signs of intrathecal spread such as rapid motor block. Please see nursing notes for vital signs. Reason for block:procedure for pain

## 2022-11-07 NOTE — H&P (Addendum)
Toni Livingston is a 27 y.o. G1P0000 at [redacted]w[redacted]d gestation presents for complaint of Contractions, painful.  No lof, vb. +FM. Denies h/a, vision changes, ruq pain.  Antepartum course: LGA at anatomy, efw 7'7/ 61% at 38 wga [facility] at [facility] since 10.1 wks.  See complete pre-natal records  History OB History     Gravida  1   Para  0   Term  0   Preterm  0   AB  0   Living  0      SAB  0   IAB  0   Ectopic  0   Multiple  0   Live Births  0          Past Medical History:  Diagnosis Date   Ankylosing spondylitis (HCC)    Closed fracture of fifth metacarpal bone 07/19/2019   Crohn's disease (HCC)    Iritis    LGSIL of cervix of undetermined significance 11/14/2018   Vaginal high risk HPV DNA test positive 11/19/2018   Vertigo    Past Surgical History:  Procedure Laterality Date   BREAST REDUCTION SURGERY  2015   COLONOSCOPY  07/2021   HAND SURGERY Left 07/24/2019   Family History: family history includes Anxiety disorder in her sister; Breast cancer in her maternal grandmother; Colon polyps in her father; Depression in her sister; Diabetes in her maternal grandfather and maternal grandmother; Emphysema in her maternal grandmother; Healthy in her brother, brother, and sister; Hypertension in her mother; Testicular cancer in her father; Uterine cancer in her maternal aunt. Social History:  reports that she has never smoked. She has been exposed to tobacco smoke. She has never used smokeless tobacco. She reports that she does not currently use alcohol. She reports that she does not use drugs.  ROS: See above otherwise negative  Prenatal labs:  ABO, Rh: --/--/A POS (05/20 0258) Antibody: NEG (05/20 0258) Rubella:  immune RPR:   non reactive HBsAg:   neg HIV:  neg GBS:   neg 1 hr Glucola: Normal Genetic screening: Normal Anatomy US: Normal  Physical Exam:   Dilation: 4 Effacement (%): 100 Station: -2 Exam by:: Marquita C, RN Blood  pressure 139/86, pulse 100, temperature 97.6 F (36.4 C), temperature source Oral, resp. rate 18, height 5\' 8"  (1.727 m), weight 107.3 kg, last menstrual period 01/22/2022, SpO2 95 %. A&O x 3 HEENT: Normal Lungs: CTAB CV: RRR Abdominal: Soft, Non-tender, Gravid, and Estimated fetal weight: 8 lbs  Lower Extremities: Non-edematous, Non-tender  Pelvic Exam:      Dilatation: 5cm     Effacement: 90%     Station: -3     Presentation: Cephalic; arom with clear fluid; IUPC placed  Labs:  CBC:  Lab Results  Component Value Date   WBC 13.1 (H) 11/07/2022   RBC 4.86 11/07/2022   HGB 14.4 11/07/2022   HCT 42.3 11/07/2022   MCV 87.0 11/07/2022   MCH 29.6 11/07/2022   MCHC 34.0 11/07/2022   RDW 14.0 11/07/2022   PLT 252 11/07/2022   CMP:  Lab Results  Component Value Date   NA 131 (L) 11/07/2022   K 3.2 (L) 11/07/2022   CL 101 11/07/2022   CO2 17 (L) 11/07/2022   GLUCOSE 93 11/07/2022   BUN 7 11/07/2022   CREATININE 0.67 11/07/2022   CALCIUM 8.9 11/07/2022   PROT 6.8 11/07/2022   AST 17 11/07/2022   ALT 17 11/07/2022   ALBUMIN 3.2 (L) 11/07/2022   ALKPHOS 144 (H)  11/07/2022   BILITOT 0.7 11/07/2022   GFRNONAA >60 11/07/2022   GFRAA 141 10/06/2020   ANIONGAP 13 11/07/2022   PC ratio: 0.12  TOCO: irreg q 2-4  Prenatal Transfer Tool  Maternal Diabetes: No Genetic Screening: Normal Maternal Ultrasounds/Referrals: Normal Fetal Ultrasounds or other Referrals:  None Maternal Substance Abuse:  No Significant Maternal Medications:  None Significant Maternal Lab Results: Group B Strep negative Number of Prenatal Visits:greater than 3 verified prenatal visits Other Comments:  None   Assessment/Plan:  27 y.o. G1P0000 at [redacted]w[redacted]d gestation   Active labor - admit for labor, anticipate svd; pt plans epidural; IUPC; plan pitocin for augmentation as neeed, reassess 1-2 hr Elevated bp - mild range bps on admission, nml labs, c/w ghtn; follow closely Fetal status reassuring Aga   Rh pos RI Gbs neg   Vick Frees 11/07/2022, 6:06 AM

## 2022-11-07 NOTE — Anesthesia Preprocedure Evaluation (Addendum)
Anesthesia Evaluation  Patient identified by MRN, date of birth, ID band Patient awake    Reviewed: Allergy & Precautions, NPO status , Patient's Chart, lab work & pertinent test results  Airway Mallampati: III  TM Distance: >3 FB Neck ROM: Full    Dental  (+) Teeth Intact, Dental Advisory Given   Pulmonary neg pulmonary ROS   Pulmonary exam normal breath sounds clear to auscultation       Cardiovascular negative cardio ROS Normal cardiovascular exam Rhythm:Regular Rate:Normal     Neuro/Psych negative neurological ROS     GI/Hepatic Neg liver ROS,,,Crohn's disease   Endo/Other  Obesity   Renal/GU negative Renal ROS     Musculoskeletal  (+) Arthritis ,  Ankylosing spondylitis   Abdominal   Peds  Hematology negative hematology ROS (+) Plt 252k   Anesthesia Other Findings Day of surgery medications reviewed with the patient.  Reproductive/Obstetrics (+) Pregnancy                             Anesthesia Physical Anesthesia Plan  ASA: 2  Anesthesia Plan: Epidural   Post-op Pain Management:    Induction:   PONV Risk Score and Plan: 2 and Treatment may vary due to age or medical condition  Airway Management Planned: Natural Airway  Additional Equipment:   Intra-op Plan:   Post-operative Plan:   Informed Consent: I have reviewed the patients History and Physical, chart, labs and discussed the procedure including the risks, benefits and alternatives for the proposed anesthesia with the patient or authorized representative who has indicated his/her understanding and acceptance.     Dental advisory given  Plan Discussed with:   Anesthesia Plan Comments: (Patient identified. Risks/Benefits/Options discussed with patient including but not limited to bleeding, infection, nerve damage, paralysis, failed block, incomplete pain control, headache, blood pressure changes, nausea,  vomiting, reactions to medication both or allergic, itching and postpartum back pain. Confirmed with bedside nurse the patient's most recent platelet count. Confirmed with patient that they are not currently taking any anticoagulation, have any bleeding history or any family history of bleeding disorders. Patient expressed understanding and wished to proceed. All questions were answered. )       Anesthesia Quick Evaluation

## 2022-11-08 DIAGNOSIS — Z412 Encounter for routine and ritual male circumcision: Secondary | ICD-10-CM | POA: Diagnosis not present

## 2022-11-08 LAB — CBC
HCT: 34 % — ABNORMAL LOW (ref 36.0–46.0)
Hemoglobin: 11.6 g/dL — ABNORMAL LOW (ref 12.0–15.0)
MCH: 29.9 pg (ref 26.0–34.0)
MCHC: 34.1 g/dL (ref 30.0–36.0)
MCV: 87.6 fL (ref 80.0–100.0)
Platelets: 197 10*3/uL (ref 150–400)
RBC: 3.88 MIL/uL (ref 3.87–5.11)
RDW: 14.3 % (ref 11.5–15.5)
WBC: 16.9 10*3/uL — ABNORMAL HIGH (ref 4.0–10.5)
nRBC: 0 % (ref 0.0–0.2)

## 2022-11-08 NOTE — Progress Notes (Signed)
   PPD #1 S/P NSVD  Live born female  Birth Weight: 7 lb 15.7 oz (3620 g) APGAR: 8, 9  Newborn Delivery   Birth date/time: 11/07/2022 15:51:00 Delivery type: Vaginal, Spontaneous     Baby name: Christell Faith  Delivering provider: Noland Fordyce   Lacerations: 2nd degree;Perineal   Circumcision: Planning  Feeding: breast  Pain control at delivery: Epidural   S:  Reports feeling well.              Tolerating PO/No nausea or vomiting             Bleeding is light             Pain controlled with acetaminophen and ibuprofen (OTC)             Up ad lib/ambulatory/voiding without difficulties   O:  A & O x 3, in no apparent distress  Vitals:   11/07/22 1834 11/07/22 1951 11/07/22 2341 11/08/22 0555  BP: 113/76 121/81 95/64 101/64  Pulse: (!) 110 (!) 114 78 71  Resp: 16 18 16 16   Temp: 98.7 F (37.1 C) 98.4 F (36.9 C) 98 F (36.7 C) 98.2 F (36.8 C)  TempSrc: Oral Oral Oral Oral  SpO2: 97% 97% 98% 98%  Weight:      Height:       Recent Labs    11/07/22 1706 11/08/22 0410  WBC 19.8* 16.9*  HGB 13.5 11.6*  HCT 40.0 34.0*  PLT 198 197    Blood type: --/--/A POS (05/20 0258)  Rubella: Immune (10/24 0000)   I&O: I/O last 3 completed shifts: In: -  Out: 1450 [Urine:1250; Blood:200]          No intake/output data recorded.  Gen: AAO x 3, NAD Abdomen: soft, non-tender, non-distended Fundus: firm, non-tender, U-1 Perineum: repair intact Lochia: small Extremities: no edema, no calf pain or tenderness   A/P:  PPD # 1 26 y.o., G1P1001  Principal Problem:   Postpartum care following vaginal delivery 5/20  Doing well - stable status  Routine post partum orders Active Problems:   Normal labor   SVD (spontaneous vaginal delivery)   Second degree perineal laceration  Discussed perineal care and comfort measures.   Anticipate discharge tomorrow.   June Leap, MSN, CNM 11/08/2022, 9:37 AM

## 2022-11-08 NOTE — Plan of Care (Signed)
  Problem: Nutrition: Goal: Adequate nutrition will be maintained Outcome: Progressing   Problem: Coping: Goal: Level of anxiety will decrease Outcome: Progressing   Problem: Elimination: Goal: Will not experience complications related to bowel motility Outcome: Progressing Goal: Will not experience complications related to urinary retention Outcome: Progressing   Problem: Pain Managment: Goal: General experience of comfort will improve Outcome: Progressing   Problem: Safety: Goal: Ability to remain free from injury will improve Outcome: Progressing   Problem: Skin Integrity: Goal: Risk for impaired skin integrity will decrease Outcome: Progressing   Problem: Education: Goal: Knowledge of condition will improve Outcome: Progressing Goal: Individualized Educational Video(s) Outcome: Progressing Goal: Individualized Newborn Educational Video(s) Outcome: Progressing   Problem: Activity: Goal: Will verbalize the importance of balancing activity with adequate rest periods Outcome: Progressing Goal: Ability to tolerate increased activity will improve Outcome: Progressing   Problem: Coping: Goal: Ability to identify and utilize available resources and services will improve Outcome: Progressing   Problem: Life Cycle: Goal: Chance of risk for complications during the postpartum period will decrease Outcome: Progressing   Problem: Role Relationship: Goal: Ability to demonstrate positive interaction with newborn will improve Outcome: Progressing   Problem: Skin Integrity: Goal: Demonstration of wound healing without infection will improve Outcome: Progressing

## 2022-11-08 NOTE — Lactation Note (Signed)
This note was copied from a baby's chart. Lactation Consultation Note  Patient Name: Toni Livingston [id number] Date: 11/08/2022 Age:28 hours Reason for consult: Follow-up assessment;Term;Primapara;1st time breastfeeding;Breast reduction;Infant weight loss;Breastfeeding assistance (0.97% WL)  The infant was at 31 hours old.  LC entered the room and the birth parent was holding the infant.  Per the birth parent things are going well with breastfeeding.  She stated that the latch is comfortable.  LC assisted the birth parent with pumping using a size #21 flange.  The birth parent could use a size #18 or 19 flange.  The birth parent stated that she has a Spectra S1 at home.  LC reviewed pumping frequency, milk production, assembling and disassembling, and washing pump part.  LC also reviewed outpatient lactation services brochure.  All questions were answered.  The birth parent pumped for 15 min and drops were noted on the flange.  The birth parent used a gloved finger to put the colostrum on the infant's gums.  The birth parent was encouraged to call lactation for assistance.   Infant Feeding Plan:  Breastfeed 8+ times in 24 hours according to feeding cues. Pump after feedings or every other feeding.  Feed the expressed milk to the infant via a bottle or syringe.  Call RN/LC for assistance with breastfeeding.    Lactation Tools Discussed/Used Flange Size: 21 Breast pump type: Double-Electric Breast Pump Pump Education: Setup, frequency, and cleaning;Milk Storage Reason for Pumping: Breast reduction Pumping frequency: Every feeding/every other feeding  Interventions Interventions: Education  Discharge Pump: Personal;DEBP  Consult Status Consult Status: Follow-up Date: 11/09/22 Follow-up type: In-patient   Orvil Feil Brynnlee Cumpian 11/08/2022, 11:29 AM

## 2022-11-08 NOTE — Lactation Note (Signed)
This note was copied from a baby's chart. Lactation Consultation Note  Patient Name: Toni Livingston [id number] Date: 11/08/2022 Age:28 hours  Attempted to see mom. FOB holding baby in recliner awake. Mom sleeping.   Maternal Data    Feeding    LATCH Score                    Lactation Tools Discussed/Used    Interventions    Discharge    Consult Status      [name], Diamond Nickel 11/08/2022, 1:03 AM

## 2022-11-08 NOTE — Anesthesia Postprocedure Evaluation (Signed)
Anesthesia Post Note  Patient: Toni Livingston  Procedure(s) Performed: AN AD HOC LABOR EPIDURAL     Patient location during evaluation: Mother Baby Anesthesia Type: Epidural Level of consciousness: awake, awake and alert and oriented Pain management: pain level controlled Vital Signs Assessment: post-procedure vital signs reviewed and stable Respiratory status: spontaneous breathing, nonlabored ventilation and respiratory function stable Cardiovascular status: blood pressure returned to baseline and stable Postop Assessment: no headache, no backache, no apparent nausea or vomiting, able to ambulate and adequate PO intake Anesthetic complications: no   No notable events documented.  Last Vitals:  Vitals:   11/08/22 0955 11/08/22 1346  BP: 102/65 110/66  Pulse: 85 84  Resp: 18 18  Temp: 37 C 36.8 C  SpO2: 100% 99%    Last Pain:  Vitals:   11/08/22 1346  TempSrc: Oral  PainSc:    Pain Goal:                   Toni Livingston

## 2022-11-08 NOTE — Lactation Note (Signed)
This note was copied from a baby's chart. Lactation Consultation Note  Patient Name: Toni Livingston [id number] Date: 11/08/2022 Age:28 hours Reason for consult: Initial assessment;Primapara;Term Mom awake in semi laid back position had baby on the breast BF. Baby had good body alignment, BF well. Mom denies painful latch. Baby came off w/round nipple shape. Praised mom. Re-latched baby. He was getting tired.  Mom had breast reduction from size (I)to a (DD). Gave mom shells to wear in am. LC set up DEBP for mom to use starting tomorrow. Discussed pumping Q 3hrs for extra stimulation d/t surgery.Mom is tired and would like to go to sleep. Newborn feeding habits, STS, I&O, positioning, support, props reviewed. Mom encouraged to feed baby 8-12 times/24 hours and with feeding cues.  Breast are heavy and slightly compressible.  The breast the baby BF on is notable softer.  Praised mom for good BF. Encouraged to call for assistance as needed.   Maternal Data Has patient been taught Hand Expression?: Yes Does the patient have breastfeeding experience prior to this delivery?: No  Feeding    LATCH Score Latch: Grasps breast easily, tongue down, lips flanged, rhythmical sucking.  Audible Swallowing: None  Type of Nipple: Flat  Comfort (Breast/Nipple): Filling, red/small blisters or bruises, mild/mod discomfort (breast heavy/slightly compressible)  Hold (Positioning): Assistance needed to correctly position infant at breast and maintain latch.  LATCH Score: 5   Lactation Tools Discussed/Used Tools: Shells;Pump;Flanges Flange Size: 21 Breast pump type: Double-Electric Breast Pump Pump Education: Setup, frequency, and cleaning Reason for Pumping: flat/breast reduction Pumping frequency: q3 hr  Interventions Interventions: Breast feeding basics reviewed;Assisted with latch;Skin to skin;Breast massage;Hand express;Breast compression;Adjust position;Support pillows;Position  options;Shells;DEBP;LC Services brochure  Discharge    Consult Status Consult Status: Follow-up Date: 11/08/22 Follow-up type: In-patient    Charyl Dancer 11/08/2022, 2:24 AM

## 2022-11-09 MED ORDER — BENZOCAINE-MENTHOL 20-0.5 % EX AERO: 1.0000 | INHALATION_SPRAY | CUTANEOUS | Status: DC | PRN

## 2022-11-09 MED ORDER — COCONUT OIL OIL: 1.0000 | TOPICAL_OIL | 0 refills | Status: DC | PRN

## 2022-11-09 MED ORDER — ACETAMINOPHEN 325 MG PO TABS: 650.0000 mg | ORAL_TABLET | ORAL | Status: DC | PRN

## 2022-11-09 MED ORDER — IBUPROFEN 600 MG PO TABS: 600.0000 mg | ORAL_TABLET | Freq: Four times a day (QID) | ORAL | 0 refills | Status: DC

## 2022-11-09 NOTE — Discharge Instructions (Signed)
Lactation outpatient support - home visit   Jessica Bowers, IBCLC (lactation consultant)  & Birth Doula  Phone (text or call): [phone] Email: [email or web address]   Linda Coppola RN, MHA, IBCLC at [facility]: Lactation Consultant  [email or web address] Mail: [email or web address] Tel: [phone]   Additional breastfeeding resources:  [facility] https://ibconline.ca/information-sheets/  [employer] of   [email or web address]   Other Resources:  Chiropractic specialist   Dr. Leanna Hastings [email or web address]   Craniosacral therapy for baby  Erin Balkind  [email or web address]  Pediatric Dentist (for tongue ties)  Dr. Kate Lambert Spangler, [facility]  Phone: [phone]  [address] [place] [address] [email or web address]  

## 2022-11-09 NOTE — Lactation Note (Signed)
This note was copied from a baby's chart. Lactation Consultation Note  Patient Name: Toni Livingston [id number] Date: 11/09/2022 Age:28 hours   LC attempted to visit with the birth parent. The family was resting. Lactation will follow-up later.    Maternal Data    Feeding    LATCH Score                    Lactation Tools Discussed/Used    Interventions    Discharge    Consult Status      Delene Loll 11/09/2022, 8:22 AM

## 2022-11-09 NOTE — Discharge Summary (Signed)
OB Discharge Summary  Patient Name: Toni Livingston DOB: 07-26-1995 MRN: [id number]  Date of admission: 11/07/2022 Delivering provider: Noland Fordyce   Admitting diagnosis: Normal labor [O80, Z37.9] Intrauterine pregnancy: [redacted]w[redacted]d     Secondary diagnosis: Patient Active Problem List   Diagnosis Date Noted   Normal labor 11/07/2022   SVD (spontaneous vaginal delivery) 11/07/2022   Second degree perineal laceration 11/07/2022   Postpartum care following vaginal delivery 5/20 11/07/2022   Additional problems:none   Date of discharge: 11/09/2022   Discharge diagnosis: Principal Problem:   Postpartum care following vaginal delivery 5/20 Active Problems:   Normal labor   SVD (spontaneous vaginal delivery)   Second degree perineal laceration                                                              Post partum procedures: none  Augmentation: AROM and Pitocin Pain control: Epidural  Laceration:2nd degree;Perineal  Episiotomy:None  Complications: None  Hospital course:  Onset of Labor With Vaginal Delivery      27 y.o. yo G1P1001 at [redacted]w[redacted]d was admitted in Active Labor on 11/07/2022. Labor course was complicated by none.  Membrane Rupture Time/Date: 6:20 AM ,11/07/2022   Delivery Method:Vaginal, Spontaneous  Episiotomy: None  Lacerations:  2nd degree;Perineal  Patient had a postpartum course complicated by none.  She is ambulating, tolerating a regular diet, passing flatus, and urinating well. Patient is discharged home in stable condition on 11/09/22.  Newborn Data: Birth date:11/07/2022  Birth time:3:51 PM  Gender:Female  Living status:Living  Apgars:8 ,9  Weight:3620 g   Physical exam  Vitals:   11/08/22 0955 11/08/22 1346 11/08/22 2017 11/09/22 0511  BP: 102/65 110/66 114/66 113/72  Pulse: 85 84 79 78  Resp: 18 18 16 16   Temp: 98.6 F (37 C) 98.2 F (36.8 C) 98.3 F (36.8 C) 97.6 F (36.4 C)  TempSrc:  Oral Axillary Oral  SpO2: 100% 99% 99% 97%   Weight:      Height:       General: alert, cooperative, and no distress Lochia: appropriate Uterine Fundus: firm Incision: N/A Perineum: repair intact, no edema DVT Evaluation: No cords or calf tenderness. No significant calf/ankle edema. Labs: Lab Results  Component Value Date   WBC 16.9 (H) 11/08/2022   HGB 11.6 (L) 11/08/2022   HCT 34.0 (L) 11/08/2022   MCV 87.6 11/08/2022   PLT 197 11/08/2022      Latest Ref Rng & Units 11/07/2022    2:58 AM  CMP  Glucose 70 - 99 mg/dL 93   BUN 6 - 20 mg/dL 7   Creatinine 2.44 - 0.10 mg/dL 2.72   Sodium 536 - 644 mmol/L 131   Potassium 3.5 - 5.1 mmol/L 3.2   Chloride 98 - 111 mmol/L 101   CO2 22 - 32 mmol/L 17   Calcium 8.9 - 10.3 mg/dL 8.9   Total Protein 6.5 - 8.1 g/dL 6.8   Total Bilirubin 0.3 - 1.2 mg/dL 0.7   Alkaline Phos 38 - 126 U/L 144   AST 15 - 41 U/L 17   ALT 0 - 44 U/L 17       11/08/2022    5:55 AM  Edinburgh Postnatal Depression Scale Screening Tool  I have been able to laugh  and see the funny side of things. 0  I have looked forward with enjoyment to things. 0  I have blamed myself unnecessarily when things went wrong. 1  I have been anxious or worried for no good reason. 1  I have felt scared or panicky for no good reason. 1  Things have been getting on top of me. 1  I have been so unhappy that I have had difficulty sleeping. 0  I have felt sad or miserable. 0  I have been so unhappy that I have been crying. 0  The thought of harming myself has occurred to me. 0  Edinburgh Postnatal Depression Scale Total 4   Discharge instruction:  per After Visit Summary,  [facility] booklet and  "Understanding Mother & Baby Care" hospital booklet After Visit Meds:  Allergies as of 11/09/2022   No Known Allergies      Medication List     TAKE these medications    acetaminophen 325 MG tablet Commonly known as: Tylenol Take 2 tablets (650 mg total) by mouth every 4 (four) hours as needed (for pain scale <  4).   benzocaine-Menthol 20-0.5 % Aero Commonly known as: DERMOPLAST Apply 1 Application topically as needed for irritation (perineal discomfort).   Cimzia (2 Syringe) 200 MG/ML prefilled syringe Generic drug: certolizumab pegol INJECT 2 SYRINGES UNDER THE SKIN EVERY 4 WEEKS   coconut oil Oil Apply 1 Application topically as needed.   ibuprofen 600 MG tablet Commonly known as: ADVIL Take 1 tablet (600 mg total) by mouth every 6 (six) hours.   M-Natal Plus 27-1 MG Tabs Take 1 tablet by mouth daily.   prednisoLONE acetate 1 % ophthalmic suspension Commonly known as: PRED FORTE as needed.   triamcinolone cream 0.1 % Commonly known as: KENALOG Apply 1 application. topically 2 (two) times daily as needed.               Discharge Care Instructions  (From admission, onward)           Start     Ordered   11/09/22 0000  Discharge wound care:       Comments: Sitz baths 2 times /day with warm water x 1 week. May add herbals: 1 ounce dried comfrey leaf* 1 ounce calendula flowers 1 ounce lavender flowers  Supplies can be found online at [employer] sources at [employer], [employer]  1/2 ounce dried uva ursi leaves 1/2 ounce witch hazel blossoms (if you can find them) 1/2 ounce dried sage leaf 1/2 cup sea salt Directions: Bring 2 quarts of water to a boil. Turn off heat, and place 1 ounce (approximately 1 large handful) of the above mixed herbs (not the salt) into the pot. Steep, covered, for 30 minutes.  Strain the liquid well with a fine mesh strainer, and discard the herb material. Add 2 quarts of liquid to the tub, along with the 1/2 cup of salt. This medicinal liquid can also be made into compresses and peri-rinses.   11/09/22 0958           Diet: routine diet Activity: Advance as tolerated. Pelvic rest for 6 weeks.  Postpartum contraception: TBA in office Newborn Data: Live born female  Birth Weight: 7 lb 15.7 oz (3620 g) APGAR: 8,  9  Newborn Delivery   Birth date/time: 11/07/2022 15:51:00 Delivery type: Vaginal, Spontaneous      named Christell Faith Baby Feeding: Breast Disposition:home with mother Circumcision: completed Delivery Report: Review the Delivery Report  for details.   Follow up:  Follow-up Information     Obgyn, [facility] [employer] [facility]. Schedule an appointment as soon as possible for a visit in 6 week(s).   Why: For Postpartum follow-up Contact information: [address] [place] [address] [phone]                Signed: Cipriano Mile, MSN 11/09/2022, 9:59 AM

## 2022-11-09 NOTE — Lactation Note (Signed)
This note was copied from a baby's chart. Lactation Consultation Note  Patient Name: Toni Livingston [id number] Date: 11/09/2022 Age:28 hours Reason for consult: Follow-up assessment;Primapara;1st time breastfeeding;Term;Infant weight loss;Breast reduction;Breastfeeding assistance (6.35% WL)  The infant was at 32 hours old.  LC entered the room and the birth parent was holding the infant.  Per the birth parent the infant has been latching well and she has not been pumping.  LC encouraged the birth parent to pump a few times per day to help increase milk production.  LC spoke with the birth parent about milk production and how much she should be producing each week.  LC also spoke with the birth parent about engorgement, warning signs, mastitis, breast care, infant I/O, pumping, and outpatient services.  The birth parent is aware that she can do a weighted feed with the outpatient LC, watch the infant's output, and follow-up with the pediatrician to ensure that the infant is getting enough at the breast.  LC encouraged the birth parent to pay close attention to the infant's output since she has had a breast reduction and supplement if needed.  All questions were answered.   Infant Feeding Plan:  Breastfeed 8+ times per day in 24 hours according to feeding cues.  Pump after every other feeding to establish supply and for extra stimulation.  Supplement the infant if needed. Watch infant output and call the pediatrician with concerns.  Speak with the outpatient lactation consultant for assistance with breastfeeding.  Prioritize maternal rest, nutrition, and hydration.   Feeding Mother's Current Feeding Choice: Breast Milk  Interventions Interventions: Education  Discharge Discharge Education: Engorgement and breast care;Warning signs for feeding baby;Outpatient recommendation  Consult Status Consult Status: Complete Date: 11/10/22 Follow-up type: Call as needed  Toni Livingston 11/09/2022, 10:53 AM

## 2022-11-13 ENCOUNTER — Inpatient Hospital Stay (HOSPITAL_COMMUNITY): Admission: RE | Admit: 2022-11-13 | Payer: Federal, State, Local not specified - PPO | Source: Home / Self Care

## 2022-11-13 ENCOUNTER — Inpatient Hospital Stay (HOSPITAL_COMMUNITY): Payer: Federal, State, Local not specified - PPO

## 2022-11-15 ENCOUNTER — Telehealth (HOSPITAL_COMMUNITY): Payer: Self-pay | Admitting: *Deleted

## 2022-11-15 NOTE — Telephone Encounter (Signed)
Attempted Hospital Discharge Follow-Up Call.  Left voice mail requesting that patient return RN's phone call if patient has any concerns or questions.  

## 2022-11-22 DIAGNOSIS — M6281 Muscle weakness (generalized): Secondary | ICD-10-CM | POA: Diagnosis not present

## 2022-11-24 DIAGNOSIS — M6281 Muscle weakness (generalized): Secondary | ICD-10-CM | POA: Diagnosis not present

## 2022-11-29 NOTE — Progress Notes (Unsigned)
Office Visit Note  Patient: Toni Livingston             Date of Birth: [birth year]           MRN: [id number]             PCP: Natalia Leatherwood, DO Referring: Natalia Leatherwood, DO Visit Date: 12/13/2022 Occupation: @GUAROCC @  Subjective:  Medication monitoring   History of Present Illness: Felisha Claytor is a 27 y.o. female with history of ankylosing spondylitis and crohn's disease.  Patient remains on Cimzia 400 mg sq injections every 28 days, started September 21, 2021.  She remains on Cimzia without any interruptions in therapy.  No recent or recurrent infections.  No signs or symptoms of an ankylosing spondylitis flare.  She is not experiencing any increased joint pain or joint swelling.  She denies any recent uveitis flares.  She denies any signs or symptoms of a Crohn's flare.  She states she has some constipation during the postpartum period but has not had any diarrhea or blood in her stool.  She states that she had a vaginal delivery on 11/07/2022 to a healthy baby boy.  She is not currently breast-feeding.  She has been experiencing increased fatigue which she attributes to interrupted sleep.   Activities of Daily Living:  Patient reports morning stiffness for 0 minutes.   Patient Denies nocturnal pain.  Difficulty dressing/grooming: Denies Difficulty climbing stairs: Denies Difficulty getting out of chair: Denies Difficulty using hands for taps, buttons, cutlery, and/or writing: Denies  Review of Systems  Constitutional:  Positive for fatigue.  HENT:  Negative for mouth sores and mouth dryness.   Eyes:  Negative for dryness.  Respiratory:  Negative for shortness of breath.   Cardiovascular:  Negative for chest pain and palpitations.  Gastrointestinal:  Negative for blood in stool, constipation and diarrhea.  Endocrine: Negative for increased urination.  Genitourinary:  Negative for involuntary urination.  Musculoskeletal:  Negative for joint pain, gait  problem, joint pain, joint swelling, myalgias, muscle weakness, morning stiffness, muscle tenderness and myalgias.  Skin:  Negative for color change, rash, hair loss and sensitivity to sunlight.  Allergic/Immunologic: Negative for susceptible to infections.  Neurological:  Negative for dizziness and headaches.  Hematological:  Negative for swollen glands.  Psychiatric/Behavioral:  Positive for depressed mood and sleep disturbance. The patient is not nervous/anxious.     PMFS History:  Patient Active Problem List   Diagnosis Date Noted   Normal labor 11/07/2022   SVD (spontaneous vaginal delivery) 11/07/2022   Second degree perineal laceration 11/07/2022   Postpartum care following vaginal delivery 5/20 11/07/2022    Past Medical History:  Diagnosis Date   Ankylosing spondylitis (HCC)    Closed fracture of fifth metacarpal bone 07/19/2019   Crohn's disease (HCC)    Iritis    LGSIL of cervix of undetermined significance 11/14/2018   Vaginal high risk HPV DNA test positive 11/19/2018   Vertigo     Family History  Problem Relation Age of Onset   Hypertension Mother    Colon polyps Father    Testicular cancer Father    Healthy Sister    Depression Sister    Anxiety disorder Sister    Healthy Brother    Healthy Brother    Uterine cancer Maternal Aunt    Diabetes Maternal Grandmother    Emphysema Maternal Grandmother    Breast cancer Maternal Grandmother    Diabetes Maternal Grandfather    Healthy Son  Esophageal cancer Neg Hx    Past Surgical History:  Procedure Laterality Date   BREAST REDUCTION SURGERY  2015   COLONOSCOPY  07/2021   HAND SURGERY Left 07/24/2019   Social History   Social History Narrative   Single. Some college.    Works as Merchant navy officer.    Drinks caffeine.    Wears seatbelt. Smoke detector in the home.    Exercises routinely.    Feels safe in relationships.       Immunization History  Administered Date(s) Administered    Influenza,inj,Quad PF,6+ Mos 03/09/2017, 03/05/2018, 02/27/2019, 06/04/2021   Influenza-Unspecified 03/20/2016   PFIZER(Purple Top)SARS-COV-2 Vaccination 03/23/2020, 04/13/2020, 08/07/2020   PPD Test 07/27/2016   Tdap 07/27/2016     Objective: Vital Signs: BP 118/80 (BP Location: Left Arm, Patient Position: Sitting, Cuff Size: Normal)   Pulse 73   Resp 16   Ht 5\' 8"  (1.727 m)   Wt 222 lb 6.4 oz (100.9 kg)   Breastfeeding No   BMI 33.82 kg/m    Physical Exam Vitals and nursing note reviewed.  Constitutional:      Appearance: She is well-developed.  HENT:     Head: Normocephalic and atraumatic.  Eyes:     Conjunctiva/sclera: Conjunctivae normal.  Cardiovascular:     Rate and Rhythm: Normal rate and regular rhythm.     Heart sounds: Normal heart sounds.  Pulmonary:     Effort: Pulmonary effort is normal.     Breath sounds: Normal breath sounds.  Abdominal:     General: Bowel sounds are normal.     Palpations: Abdomen is soft.  Musculoskeletal:     Cervical back: Normal range of motion.  Lymphadenopathy:     Cervical: No cervical adenopathy.  Skin:    General: Skin is warm and dry.     Capillary Refill: Capillary refill takes less than 2 seconds.  Neurological:     Mental Status: She is alert and oriented to person, place, and time.  Psychiatric:        Behavior: Behavior normal.      Musculoskeletal Exam: C-spine, thoracic spine, and lumbar spine have good range of motion.  No midline spinal tenderness.  No SI joint tenderness.  Shoulder joints, elbow joints, wrist joints, MCPs, PIPs, DIPs have good range of motion with no synovitis.  Complete fist formation bilaterally.  Hip joints have good range of motion with no groin pain.  Knee joints have good range of motion with no warmth or effusion.  Ankle joints have good range of motion with no tenderness or joint swelling.  No evidence of Achilles tendinitis or plantar fasciitis.  CDAI Exam: CDAI Score: -- Patient  Global: --; Provider Global: -- Swollen: --; Tender: -- Joint Exam 12/13/2022   No joint exam has been documented for this visit   There is currently no information documented on the homunculus. Go to the Rheumatology activity and complete the homunculus joint exam.  Investigation: No additional findings.  Imaging: No results found.  Recent Labs: Lab Results  Component Value Date   WBC 16.9 (H) 11/08/2022   HGB 11.6 (L) 11/08/2022   PLT 197 11/08/2022   NA 131 (L) 11/07/2022   K 3.2 (L) 11/07/2022   CL 101 11/07/2022   CO2 17 (L) 11/07/2022   GLUCOSE 93 11/07/2022   BUN 7 11/07/2022   CREATININE 0.67 11/07/2022   BILITOT 0.7 11/07/2022   ALKPHOS 144 (H) 11/07/2022   AST 17 11/07/2022   ALT 17 11/07/2022  PROT 6.8 11/07/2022   ALBUMIN 3.2 (L) 11/07/2022   CALCIUM 8.9 11/07/2022   GFRAA 141 10/06/2020   QFTBGOLDPLUS NEGATIVE 03/07/2022    Speciality Comments: humira- inadequate response Cimzia started September 29, 2021  Procedures:  No procedures performed Allergies: Patient has no known allergies.   Assessment / Plan:     Visit Diagnoses: Ankylosing spondylitis of sacral region [facility]) - History of chronic SI joint pain, SI joint sclerosis on the x-rays, HLA-B27 positive. SI joints injected on 05/27/2020: She has not had any signs or symptoms of an ankylosing spondylitis flare.  She has no synovitis or dactylitis on examination today.  No evidence of Achilles tendinitis or plantar fasciitis.  She has good range of motion of the C-spine, thoracic spine, lumbar spine with no discomfort.  No midline spinal tenderness or SI joint tenderness on examination today.  She remains on Cimzia 400 mg subcutaneous injections once monthly.  She is tolerating Cimzia without any side effects.  No recent or recurrent infections.  She recently gave birth vaginally to a baby boy on 11/07/2022 and is not currently breast-feeding.  She has not had any gaps in therapy while on Cimzia.  She will  remain on Cimzia as prescribed.  She was vies notify us if she develops signs or symptoms of a flare.  She will follow-up in the office in 3 months or sooner if needed  High risk medication use - Cimzia 400 mg sq injections every 28 days, started September 21, 2021.  CBC and CMP updated on 11/07/22. Her next lab work will be due in August and every 3 months.  Standing orders for CBC and CMP placed today.  TB gold negative on 03/07/22. Future order for TB gold placed today.  Discussed the importance of holding cimzia if she develops signs or symptoms of an infection and to resume once the infection has completely cleared. - Plan: QuantiFERON-TB Gold Plus, CBC with Differential/Platelet, COMPLETE METABOLIC PANEL WITH GFR  Screening for tuberculosis - Future order for TB gold placed today. Plan: QuantiFERON-TB Gold Plus  Iridocyclitis - Dr. Sherryll Burger.  Inadequate response to Humira.  No signs or symptoms of a flare.  No conjunctival injection noted.  She will remain on cimzia as prescribed.   HLA B27 positive  Crohn's disease of colon with other complication (HCC) - Fecal calprotectin 100-borderline inflammation on 03/10/22.  She remains on Cimzia 400 mg subcutaneous injections every 28 days.  No recent flares.  No blood in stool. Followed by Dr. Leonides Schanz. Scheduled for upcoming follow up on 12/28/22.   Chronic SI joint pain: No SI joint tenderness upon palpation.   Other medical conditions are listed as follows:   ANA positive: No clinical features of systemic lupus.   Other acne  Pilonidal cyst  Vaginal high risk HPV DNA test positive    Orders: Orders Placed This Encounter  Procedures   QuantiFERON-TB Gold Plus   CBC with Differential/Platelet   COMPLETE METABOLIC PANEL WITH GFR   No orders of the defined types were placed in this encounter.    Follow-Up Instructions: Return in about 3 months (around 03/15/2023) for Ankylosing Spondylitis, Crohn's.   Gearldine Bienenstock, PA-C  Note - This  record has been created using Dragon software.  Chart creation errors have been sought, but may not always  have been located. Such creation errors do not reflect on  the standard of medical care.

## 2022-12-08 DIAGNOSIS — M6281 Muscle weakness (generalized): Secondary | ICD-10-CM | POA: Diagnosis not present

## 2022-12-13 ENCOUNTER — Ambulatory Visit: Payer: Federal, State, Local not specified - PPO | Attending: Physician Assistant | Admitting: Physician Assistant

## 2022-12-13 ENCOUNTER — Encounter: Payer: Self-pay | Admitting: Physician Assistant

## 2022-12-13 VITALS — BP 118/80 | HR 73 | Resp 16 | Ht 68.0 in | Wt 222.4 lb

## 2022-12-13 DIAGNOSIS — H209 Unspecified iridocyclitis: Secondary | ICD-10-CM | POA: Diagnosis not present

## 2022-12-13 DIAGNOSIS — L708 Other acne: Secondary | ICD-10-CM

## 2022-12-13 DIAGNOSIS — Z1589 Genetic susceptibility to other disease: Secondary | ICD-10-CM | POA: Diagnosis not present

## 2022-12-13 DIAGNOSIS — Z79899 Other long term (current) drug therapy: Secondary | ICD-10-CM

## 2022-12-13 DIAGNOSIS — Z111 Encounter for screening for respiratory tuberculosis: Secondary | ICD-10-CM

## 2022-12-13 DIAGNOSIS — L0591 Pilonidal cyst without abscess: Secondary | ICD-10-CM

## 2022-12-13 DIAGNOSIS — R768 Other specified abnormal immunological findings in serum: Secondary | ICD-10-CM

## 2022-12-13 DIAGNOSIS — M458 Ankylosing spondylitis sacral and sacrococcygeal region: Secondary | ICD-10-CM

## 2022-12-13 DIAGNOSIS — K50118 Crohn's disease of large intestine with other complication: Secondary | ICD-10-CM

## 2022-12-13 DIAGNOSIS — M533 Sacrococcygeal disorders, not elsewhere classified: Secondary | ICD-10-CM

## 2022-12-13 DIAGNOSIS — R87811 Vaginal high risk human papillomavirus (HPV) DNA test positive: Secondary | ICD-10-CM

## 2022-12-13 DIAGNOSIS — G8929 Other chronic pain: Secondary | ICD-10-CM

## 2022-12-13 NOTE — Patient Instructions (Signed)
Standing Labs We placed an order today for your standing lab work.   Please have your standing labs drawn in August and every 3 months   Please have your labs drawn 2 weeks prior to your appointment so that the provider can discuss your lab results at your appointment, if possible.  Please note that you may see your imaging and lab results in MyChart before we have reviewed them. We will contact you once all results are reviewed. Please allow our office up to 72 hours to thoroughly review all of the results before contacting the office for clarification of your results.  WALK-IN LAB HOURS  Monday through Thursday from 8:00 am -12:30 pm and 1:00 pm-5:00 pm and Friday from 8:00 am-12:00 pm.  Patients with office visits requiring labs will be seen before walk-in labs.  You may encounter longer than normal wait times. Please allow additional time. Wait times may be shorter on  Monday and Thursday afternoons.  We do not book appointments for walk-in labs. We appreciate your patience and understanding with our staff.   Labs are drawn by [facility]. Please bring your co-pay at the time of your lab draw.  You may receive a bill from [facility] for your lab work.  Please note if you are on Hydroxychloroquine and and an order has been placed for a Hydroxychloroquine level,  you will need to have it drawn 4 hours or more after your last dose.  If you wish to have your labs drawn at another location, please call the office 24 hours in advance so we can fax the orders.  The office is located at [address], [address], [place], [place] [address]   If you have any questions regarding directions or hours of operation,  please call [phone].   As a reminder, please drink plenty of water prior to coming for your lab work. Thanks!  

## 2022-12-15 DIAGNOSIS — M6281 Muscle weakness (generalized): Secondary | ICD-10-CM | POA: Diagnosis not present

## 2022-12-20 DIAGNOSIS — F53 Postpartum depression: Secondary | ICD-10-CM | POA: Diagnosis not present

## 2022-12-20 DIAGNOSIS — Z1331 Encounter for screening for depression: Secondary | ICD-10-CM | POA: Diagnosis not present

## 2022-12-28 ENCOUNTER — Ambulatory Visit (INDEPENDENT_AMBULATORY_CARE_PROVIDER_SITE_OTHER): Payer: Federal, State, Local not specified - PPO | Admitting: Internal Medicine

## 2022-12-28 ENCOUNTER — Encounter: Payer: Self-pay | Admitting: Internal Medicine

## 2022-12-28 ENCOUNTER — Other Ambulatory Visit (INDEPENDENT_AMBULATORY_CARE_PROVIDER_SITE_OTHER): Payer: Federal, State, Local not specified - PPO

## 2022-12-28 VITALS — BP 118/80 | HR 73 | Ht 68.0 in | Wt 226.0 lb

## 2022-12-28 DIAGNOSIS — Z3046 Encounter for surveillance of implantable subdermal contraceptive: Secondary | ICD-10-CM | POA: Diagnosis not present

## 2022-12-28 DIAGNOSIS — R197 Diarrhea, unspecified: Secondary | ICD-10-CM | POA: Diagnosis not present

## 2022-12-28 DIAGNOSIS — K50019 Crohn's disease of small intestine with unspecified complications: Secondary | ICD-10-CM

## 2022-12-28 DIAGNOSIS — Z3202 Encounter for pregnancy test, result negative: Secondary | ICD-10-CM | POA: Diagnosis not present

## 2022-12-28 LAB — CBC WITH DIFFERENTIAL/PLATELET
Basophils Absolute: 0 10*3/uL (ref 0.0–0.1)
Basophils Relative: 0.6 % (ref 0.0–3.0)
Eosinophils Absolute: 0.2 10*3/uL (ref 0.0–0.7)
Eosinophils Relative: 2.5 % (ref 0.0–5.0)
HCT: 42.2 % (ref 36.0–46.0)
Hemoglobin: 13.9 g/dL (ref 12.0–15.0)
Lymphocytes Relative: 27.1 % (ref 12.0–46.0)
Lymphs Abs: 1.8 10*3/uL (ref 0.7–4.0)
MCHC: 33 g/dL (ref 30.0–36.0)
MCV: 87.4 fl (ref 78.0–100.0)
Monocytes Absolute: 0.5 10*3/uL (ref 0.1–1.0)
Monocytes Relative: 7.7 % (ref 3.0–12.0)
Neutro Abs: 4.1 10*3/uL (ref 1.4–7.7)
Neutrophils Relative %: 62.1 % (ref 43.0–77.0)
Platelets: 360 10*3/uL (ref 150.0–400.0)
RBC: 4.83 Mil/uL (ref 3.87–5.11)
RDW: 13 % (ref 11.5–15.5)
WBC: 6.7 10*3/uL (ref 4.0–10.5)

## 2022-12-28 LAB — COMPREHENSIVE METABOLIC PANEL
ALT: 22 U/L (ref 0–35)
AST: 19 U/L (ref 0–37)
Albumin: 4.2 g/dL (ref 3.5–5.2)
Alkaline Phosphatase: 97 U/L (ref 39–117)
BUN: 9 mg/dL (ref 6–23)
CO2: 26 mEq/L (ref 19–32)
Calcium: 9.5 mg/dL (ref 8.4–10.5)
Chloride: 104 mEq/L (ref 96–112)
Creatinine, Ser: 0.8 mg/dL (ref 0.40–1.20)
GFR: 100.83 mL/min (ref >60.00)
Glucose, Bld: 119 mg/dL — ABNORMAL HIGH (ref 70–99)
Potassium: 3.4 mEq/L — ABNORMAL LOW (ref 3.5–5.1)
Sodium: 139 mEq/L (ref 135–145)
Total Bilirubin: 0.5 mg/dL (ref 0.2–1.2)
Total Protein: 7.2 g/dL (ref 6.0–8.3)

## 2022-12-28 LAB — HIGH SENSITIVITY CRP: CRP, High Sensitivity: 14.8 mg/L — ABNORMAL HIGH (ref 0.000–5.000)

## 2022-12-28 LAB — IBC + FERRITIN
Ferritin: 18.5 ng/mL (ref 10.0–291.0)
Iron: 74 ug/dL (ref 42–145)
Saturation Ratios: 24.5 % (ref 20.0–50.0)
TIBC: 302.4 ug/dL (ref 250.0–450.0)
Transferrin: 216 mg/dL (ref 212.0–360.0)

## 2022-12-28 LAB — FOLATE: Folate: >23.8 ng/mL (ref >5.9)

## 2022-12-28 LAB — SEDIMENTATION RATE: Sed Rate: 31 mm/hr — ABNORMAL HIGH (ref 0–20)

## 2022-12-28 LAB — VITAMIN B12: Vitamin B-12: 202 pg/mL — ABNORMAL LOW (ref 211–911)

## 2022-12-28 NOTE — Patient Instructions (Addendum)
You are scheduled for a follow up visit on 04/07/23 at 1:50 pm  Please have labs done before next Cimzia dose   If your blood pressure at your visit was 140/90 or greater, please contact your primary care physician to follow up on this.  _______________________________________________________  If you are age 28 or older, your body mass index should be between 23-30. Your Body mass index is 34.36 kg/m. If this is out of the aforementioned range listed, please consider follow up with your Primary Care Provider.  If you are age 23 or younger, your body mass index should be between 19-25. Your Body mass index is 34.36 kg/m. If this is out of the aformentioned range listed, please consider follow up with your Primary Care Provider.   ________________________________________________________  The [facility] providers would like to encourage you to use [facility] to communicate with providers for non-urgent requests or questions.  Due to long hold times on the telephone, sending your provider a message by [facility] may be a faster and more efficient way to get a response.  Please allow 48 business hours for a response.  Please remember that this is for non-urgent requests.  _______________________________________________________   Thank you for entrusting me with your care and for choosing [facility], Dr. Eulah Pont

## 2022-12-28 NOTE — Progress Notes (Unsigned)
Chief Complaint: Diarrhea and positive p-ANCA  HPI:    Toni Livingston is a 27 year old female with history of ileal Crohn's disease and ankylosing spondylitis presents for follow up of Crohn's disease  Interval History: She recently delivered a healthy baby boy in 10/2022. [name] is doing well. She has an occasional stomach ache, which was mostly left sided and lasted for a few hours before subsiding. She is having one BM per day. Occasionally sees blood when she wipes, which is due to hemorrhoids. Denies major complications from delivery. She is eating and drinking well. She is not breastfeeding. Denies fevers. Last dose of Cimzia was last week. Next dose of Cimzia is 7/31. Denies vision changes, arthritis, or skin lesions.     She is currently [redacted] weeks pregnant. Her due date is on 5/20. She has been doing well on Cimzia and continues to take this every 4 weeks. Last dose of Cimzia was 3/13. She does feel like her BMs have been more frequent. A couple of weeks ago, she had constipation and started taking a stool softener. Then 1.5 weeks ago, she started having more frequent BMs.  Since then, she has been having 3-4 BMs per day. Denies nocturnal stools. Denies blood in stools. Denies fevers. Denies sick contacts. Denies abdominal pain. She is eating and drinking well.  Her OB/GYN visits have been going well.  Current Outpatient Medications  Medication Sig Dispense Refill   CIMZIA, 2 SYRINGE, 200 MG/ML prefilled syringe INJECT 2 SYRINGES UNDER THE SKIN EVERY 4 WEEKS 1 each 2   prednisoLONE acetate (PRED FORTE) 1 % ophthalmic suspension as needed.     triamcinolone cream (KENALOG) 0.1 % Apply 1 application. topically 2 (two) times daily as needed. 80 g 3   ZOLOFT 50 MG tablet      acetaminophen (TYLENOL) 325 MG tablet Take 2 tablets (650 mg total) by mouth every 4 (four) hours as needed (for pain scale < 4).     benzocaine-Menthol (DERMOPLAST) 20-0.5 % AERO Apply 1 Application topically  as needed for irritation (perineal discomfort).     coconut oil OIL Apply 1 Application topically as needed.  0   ibuprofen (ADVIL) 600 MG tablet Take 1 tablet (600 mg total) by mouth every 6 (six) hours. 30 tablet 0   Prenatal Vit-Fe Fumarate-FA (M-NATAL PLUS) 27-1 MG TABS Take 1 tablet by mouth daily. 30 tablet 3   No current facility-administered medications for this visit.     Physical Exam:  Vital signs: BP 118/80   Pulse 73   Ht 5\' 8"  (1.727 m)   Wt 226 lb (102.5 kg)   BMI 34.36 kg/m   Constitutional:   Pleasant Caucasian female appears to be in NAD, Well developed, Well nourished, alert and cooperative Respiratory: Respirations even and unlabored. Lungs clear to auscultation bilaterally.   No wheezes, crackles, or rhonchi.  Cardiovascular: Normal S1, S2. No MRG. Regular rate and rhythm. No peripheral edema, cyanosis or pallor.  Gastrointestinal:  Soft, gravid uterus, non-tender Psychiatric:  Demonstrates good judgement and reason without abnormal affect or behaviors.  RELEVANT LABS AND IMAGING: CBC    Component Value Date/Time   WBC 16.9 (H) 11/08/2022 0410   RBC 3.88 11/08/2022 0410   HGB 11.6 (L) 11/08/2022 0410   HCT 34.0 (L) 11/08/2022 0410   PLT 197 11/08/2022 0410   MCV 87.6 11/08/2022 0410   MCH 29.9 11/08/2022 0410   MCHC 34.1 11/08/2022 0410   RDW 14.3 11/08/2022 0410   LYMPHSABS  1.5 09/08/2022 1546   MONOABS 0.7 09/08/2022 1546   EOSABS 0.1 09/08/2022 1546   BASOSABS 0.1 09/08/2022 1546    CMP     Component Value Date/Time   NA 131 (L) 11/07/2022 0258   K 3.2 (L) 11/07/2022 0258   CL 101 11/07/2022 0258   CO2 17 (L) 11/07/2022 0258   GLUCOSE 93 11/07/2022 0258   BUN 7 11/07/2022 0258   CREATININE 0.67 11/07/2022 0258   CREATININE 0.49 (L) 09/12/2022 1358   CALCIUM 8.9 11/07/2022 0258   PROT 6.8 11/07/2022 0258   ALBUMIN 3.2 (L) 11/07/2022 0258   AST 17 11/07/2022 0258   ALT 17 11/07/2022 0258   ALKPHOS 144 (H) 11/07/2022 0258   BILITOT 0.7  11/07/2022 0258   GFRNONAA >60 11/07/2022 0258   GFRNONAA 122 10/06/2020 1132   GFRAA 141 10/06/2020 1132   Labs 08/2021: Humira level <0.6. Humira antibody level of 3820.   Labs 11/2021: CBC and CMP unremarkable.  Labs 02/2022: CBC and CMP unremarkable. Fecal calprotectin 100 mg (borderline). Quant gold negative. ESR mildly elevated at 21.   Labs 05/2022: CBC and CMP unremarkable  Labs 08/2022: [facility] GI pathogen panel. Stool test was positive for enteropathogenic E coli (EPEC). C dif was negative. Fecal calprotectin was elevated at 197. Cimzia level at 39 and antibody level at 246. ESR elevated at 36. CRP elevated at 18.51. CBC nml.   Labs 10/2022: CBC with elevated WBC of 16.9 and low Hb of 11.6. CMP with low Na of 131, low K of 3.2, elevated alk phos of 144, low albumin of 3.2.   CT enterography 09/06/21: IMPRESSION: 1. There is short segment circumferential wall thickening and mucosal hyperenhancement of the terminal ileum, involving a segment approximately 4 cm in length from the ileocecal valve. This appearance is in keeping with Crohn's ileitis. No evidence of complicating stricture, fistula, or abscess at this time. No other evidence of bowel inflammation. 2. IUD is present in the uterus, although in an abnormal appearing low lying position, within the lower uterine segment and or endocervical canal. Consider pelvic ultrasound to assess appropriate positioning. 3. No osseous stigmata of ankylosing spondylitis by CT.  Colonoscopy 08/16/21: - Inflammed and strictured terminal ileum, otherwise the examination was normal. - Biopsies taken from terminal ileum, right and left colon. - The examination was otherwise normal on direct and retroflexion views. Path: 1. Surgical [P], small bowel, terminal ileum - SEVERELY ACTIVE CHRONIC, NONSPECIFIC ILEITIS - NO GRANULOMAS, DYSPLASIA OR MALIGNANCY IDENTIFIED - SEE COMMENT 2. Surgical [P], right colon biopsy - BENIGN COLONIC MUCOSA - NO  ACTIVE INFLAMMATION OR EVIDENCE OF MICROSCOPIC COLITIS - NO HIGH-GRADE DYSPLASIA OR MALIGNANCY IDENTIFIED 3. Surgical [P], left colon biopsy - BENIGN COLONIC MUCOSA - NO ACTIVE INFLAMMATION OR EVIDENCE OF MICROSCOPIC COLITIS - NO HIGH-GRADE DYSPLASIA OR MALIGNANCY IDENTIFIED Microscopic Comment 1. The biopsy is scant and not typical of inflammatory bowel disease. The differential diagnosis would include drugs, infection and inflammatory bowel disease. Radiologic correlation and patient follow-up is suggested. Dr. Kenard Gower reviewed the case and agrees with the above diagnosis.  Assessment: Crohn's disease Diarrhea Pregnancy Patient presents for follow up of ileal Crohn's disease. She has been doing well on Cimzia since 09/2021.  The last time I checked her Cimzia levels in 02/2022, she did have adequate Cimzia levels along with some antibody development.  Will recheck her Cimzia levels and antibodies today to see if these antibodies are increasing.  If her antibody level is increasing, will plan to either  increase her Cimzia dose or frequency.  Patient did describe some issues with diarrhea today so we will recheck her inflammatory markers and fecal calprotectin.  Will also rule out underlying GI infection with stool studies.  Her pregnancy has been going well.  Plan: - Plan to check labs with CBC, CMP, CRP, ESR, TPMT enzyme activity level, Cimzia level and antibody, vitamin D, vitamin B12, folate, ferritin/IBC before next Cimzia dose - Check fecal calprotectin - RTC in 3 months  I spent 41 minutes of time, including in depth chart review, independent review of results as outlined above, communicating results with the patient directly, face-to-face time with the patient, coordinating care, ordering studies and medications as appropriate, and documentation.

## 2023-01-02 ENCOUNTER — Other Ambulatory Visit: Payer: Self-pay | Admitting: Physician Assistant

## 2023-01-02 NOTE — Telephone Encounter (Signed)
Last Fill: 10/12/2022  Labs: 12/28/2022 Glucose 119, Potassium 3.4  TB Gold: 03/07/2022 Neg    Next Visit: 03/15/2023  Last Visit: 12/13/2022  DX:Ankylosing spondylitis of sacral region   Current Dose per office note 12/13/2022: Cimzia 400 mg sq injections every 28 days  Okay to refill Cimzia?

## 2023-01-05 LAB — VITAMIN D 1,25 DIHYDROXY
Vitamin D 1, 25 (OH)2 Total: 33 pg/mL (ref 18–72)
Vitamin D2 1, 25 (OH)2: <8 pg/mL
Vitamin D3 1, 25 (OH)2: 33 pg/mL

## 2023-01-05 LAB — THIOPURINE METHYLTRANSFERASE (TPMT), RBC: Thiopurine Methyltransferase, RBC: 19 nmol/hr/mL RBC

## 2023-01-05 LAB — SERIAL MONITORING

## 2023-01-05 LAB — CERTOLIZUMAB AND ANTI-CERTO AB

## 2023-01-06 NOTE — Progress Notes (Signed)
Beth, please call the patient to let her know that her vitamin B12 levels are low. I would recommend that she take weekly injections of vitamin B12 1000 mcg for the next 4 weeks, then go to monthly injections after that. Please also let her know that her Cimzia antibody levels remain elevated. I would recommend that she start on azathioprine 50 mg every day as we had discussed in clinic. If she is agreeable, then please send the prescription in her for her. Her TPMT enzyme level looks normal so she should be able to tolerate the azathioprine.

## 2023-01-07 LAB — CERTOLIZUMAB AND ANTI-CERTO AB: Anti-Certolizumab Ab Level: 511 ng/mL

## 2023-01-09 ENCOUNTER — Other Ambulatory Visit: Payer: Self-pay

## 2023-01-09 MED ORDER — AZATHIOPRINE 50 MG PO TABS: 50.0000 mg | ORAL_TABLET | Freq: Every day | ORAL | 3 refills | Status: DC

## 2023-01-09 MED ORDER — CYANOCOBALAMIN 1000 MCG/ML IJ SOLN: INTRAMUSCULAR | 11 refills | Status: DC

## 2023-01-09 NOTE — Progress Notes (Signed)
Hi Beth, I just wanted to make sure that I sent you the previous message, "Beth, please call the patient to let her know that her vitamin B12 levels are low. I would recommend that she take weekly injections of vitamin B12 1000 mcg for the next 4 weeks, then go to monthly injections after that. Please also let her know that her Cimzia antibody levels remain elevated. I would recommend that she start on azathioprine 50 mg every day as we had discussed in clinic to try to reduce her antibody level. If she is agreeable, then please send the prescription in her for her. Her TPMT enzyme level looks normal so she should be able to tolerate the azathioprine." Thanks

## 2023-01-17 ENCOUNTER — Other Ambulatory Visit: Payer: Federal, State, Local not specified - PPO

## 2023-01-17 ENCOUNTER — Encounter: Payer: Self-pay | Admitting: Internal Medicine

## 2023-01-17 DIAGNOSIS — K50019 Crohn's disease of small intestine with unspecified complications: Secondary | ICD-10-CM | POA: Diagnosis not present

## 2023-02-01 ENCOUNTER — Other Ambulatory Visit: Payer: Self-pay | Admitting: Internal Medicine

## 2023-02-02 ENCOUNTER — Other Ambulatory Visit: Payer: Self-pay | Admitting: *Deleted

## 2023-02-02 DIAGNOSIS — Z111 Encounter for screening for respiratory tuberculosis: Secondary | ICD-10-CM | POA: Diagnosis not present

## 2023-02-02 DIAGNOSIS — Z79899 Other long term (current) drug therapy: Secondary | ICD-10-CM

## 2023-02-03 NOTE — Progress Notes (Signed)
CBC and CMP are normal.

## 2023-02-05 LAB — COMPLETE METABOLIC PANEL WITH GFR
AG Ratio: 1.8 (calc) (ref 1.0–2.5)
ALT: 15 U/L (ref 6–29)
AST: 19 U/L (ref 10–30)
Albumin: 4.6 g/dL (ref 3.6–5.1)
Alkaline phosphatase (APISO): 82 U/L (ref 31–125)
BUN: 12 mg/dL (ref 7–25)
CO2: 24 mmol/L (ref 20–32)
Calcium: 9.6 mg/dL (ref 8.6–10.2)
Chloride: 106 mmol/L (ref 98–110)
Creat: 0.72 mg/dL (ref 0.50–0.96)
Globulin: 2.6 g/dL (ref 1.9–3.7)
Glucose, Bld: 79 mg/dL (ref 65–99)
Potassium: 4 mmol/L (ref 3.5–5.3)
Sodium: 138 mmol/L (ref 135–146)
Total Bilirubin: 0.5 mg/dL (ref 0.2–1.2)
Total Protein: 7.2 g/dL (ref 6.1–8.1)
eGFR: 117 mL/min/{1.73_m2} (ref >=60)

## 2023-02-05 LAB — CBC WITH DIFFERENTIAL/PLATELET
Absolute Monocytes: 582 {cells}/uL (ref 200–950)
Basophils Absolute: 32 {cells}/uL (ref 0–200)
Basophils Relative: 0.5 %
Eosinophils Absolute: 211 {cells}/uL (ref 15–500)
Eosinophils Relative: 3.3 %
HCT: 42 % (ref 35.0–45.0)
Hemoglobin: 14 g/dL (ref 11.7–15.5)
Lymphs Abs: 1715 {cells}/uL (ref 850–3900)
MCH: 28.7 pg (ref 27.0–33.0)
MCHC: 33.3 g/dL (ref 32.0–36.0)
MCV: 86.2 fL (ref 80.0–100.0)
MPV: 10.5 fL (ref 7.5–12.5)
Monocytes Relative: 9.1 %
Neutro Abs: 3859 {cells}/uL (ref 1500–7800)
Neutrophils Relative %: 60.3 %
Platelets: 379 10*3/uL (ref 140–400)
RBC: 4.87 10*6/uL (ref 3.80–5.10)
RDW: 12 % (ref 11.0–15.0)
Total Lymphocyte: 26.8 %
WBC: 6.4 10*3/uL (ref 3.8–10.8)

## 2023-02-05 LAB — QUANTIFERON-TB GOLD PLUS
Mitogen-NIL: >10 [IU]/mL
NIL: 0.03 [IU]/mL
QuantiFERON-TB Gold Plus: NEGATIVE
TB1-NIL: 0.02 [IU]/mL
TB2-NIL: 0.01 [IU]/mL

## 2023-02-06 NOTE — Progress Notes (Signed)
TB gold negative

## 2023-03-01 NOTE — Progress Notes (Unsigned)
Office Visit Note  Patient: Toni Livingston             Date of Birth: 12-Jun-1996           MRN: [id number]             PCP: Natalia Leatherwood, DO Referring: Natalia Leatherwood, DO Visit Date: 03/15/2023 Occupation: @GUAROCC @  Subjective:  Breakthrough GI symptoms   History of Present Illness: Toni Livingston is a 27 y.o. female with history of ankylosing spondylitis and crohn's disease.  Patient remains on  Cimzia 400 mg sq injections every 28 days, started September 21, 2021.  She is tolerating Cimzia without any side effects or injection site reactions.  She has not missed any doses recently.  Patient will be quitting her job in November and will be switching to her husband's insurance.  Patient states that about 1 week prior to her Cimzia injections she starts to have increased abdominal pain and bloating.  Patient states that she tried taking Imuran several different times but had to discontinue due to GI SE. She has a follow-up visit scheduled with Dr. Leonides Schanz on 04/07/2023.  She also has had intermittent pain in the left eye about 1 week prior to her injections for the past 3 doses.  She denies any signs or symptoms of an ankylosing spondylitis flare recently.  She has not had any morning stiffness, nocturnal pain, or joint swelling. Her son is currently 71 months old.  She is not breast-feeding.  She has a Nexplanon for contraception. She denies any recent or recurrent infections.    Activities of Daily Living:  Patient reports morning stiffness for 0 minutes.   Patient Denies nocturnal pain.  Difficulty dressing/grooming: Denies Difficulty climbing stairs: Denies Difficulty getting out of chair: Denies Difficulty using hands for taps, buttons, cutlery, and/or writing: Denies  Review of Systems  Constitutional:  Positive for fatigue.  HENT:  Negative for mouth sores and mouth dryness.   Eyes:  Negative for dryness.  Respiratory:  Negative for shortness of breath.    Cardiovascular:  Negative for chest pain and palpitations.  Gastrointestinal:  Negative for blood in stool, constipation and diarrhea.  Endocrine: Negative for increased urination.  Genitourinary:  Negative for involuntary urination.  Musculoskeletal:  Negative for joint pain, gait problem, joint pain, joint swelling, myalgias, muscle weakness, morning stiffness, muscle tenderness and myalgias.  Skin:  Positive for hair loss. Negative for color change, rash and sensitivity to sunlight.  Allergic/Immunologic: Negative for susceptible to infections.  Neurological:  Negative for dizziness and headaches.  Hematological:  Negative for swollen glands.  Psychiatric/Behavioral:  Positive for depressed mood and sleep disturbance. The patient is nervous/anxious.     PMFS History:  Patient Active Problem List   Diagnosis Date Noted   Normal labor 11/07/2022   SVD (spontaneous vaginal delivery) 11/07/2022   Second degree perineal laceration 11/07/2022   Postpartum care following vaginal delivery 5/20 11/07/2022    Past Medical History:  Diagnosis Date   Ankylosing spondylitis (HCC)    Closed fracture of fifth metacarpal bone 07/19/2019   Crohn's disease (HCC)    Iritis    LGSIL of cervix of undetermined significance 11/14/2018   Vaginal high risk HPV DNA test positive 11/19/2018   Vertigo     Family History  Problem Relation Age of Onset   Hypertension Mother    Colon polyps Father    Testicular cancer Father    Healthy Sister    Depression Sister  Anxiety disorder Sister    Healthy Brother    Healthy Brother    Uterine cancer Maternal Aunt    Diabetes Maternal Grandmother    Emphysema Maternal Grandmother    Breast cancer Maternal Grandmother    Diabetes Maternal Grandfather    Healthy Son    Esophageal cancer Neg Hx    Past Surgical History:  Procedure Laterality Date   BREAST REDUCTION SURGERY  2015   COLONOSCOPY  07/2021   HAND SURGERY Left 07/24/2019   Social  History   Social History Narrative   Single. Some college.    Works as Merchant navy officer.    Drinks caffeine.    Wears seatbelt. Smoke detector in the home.    Exercises routinely.    Feels safe in relationships.       Immunization History  Administered Date(s) Administered   Influenza,inj,Quad PF,6+ Mos 03/09/2017, 03/05/2018, 02/27/2019, 06/04/2021   Influenza-Unspecified 03/20/2016   PFIZER(Purple Top)SARS-COV-2 Vaccination 03/23/2020, 04/13/2020, 08/07/2020   PPD Test 07/27/2016   Tdap 07/27/2016     Objective: Vital Signs: BP 110/80 (BP Location: Left Arm, Patient Position: Sitting, Cuff Size: Normal)   Pulse 85   Resp 16   Ht 5\' 8"  (1.727 m)   Wt 222 lb 9.6 oz (101 kg)   BMI 33.85 kg/m    Physical Exam Vitals and nursing note reviewed.  Constitutional:      Appearance: She is well-developed.  HENT:     Head: Normocephalic and atraumatic.  Eyes:     Conjunctiva/sclera: Conjunctivae normal.  Cardiovascular:     Rate and Rhythm: Normal rate and regular rhythm.     Heart sounds: Normal heart sounds.  Pulmonary:     Effort: Pulmonary effort is normal.     Breath sounds: Normal breath sounds.  Abdominal:     General: Bowel sounds are normal.     Palpations: Abdomen is soft.  Musculoskeletal:     Cervical back: Normal range of motion.  Lymphadenopathy:     Cervical: No cervical adenopathy.  Skin:    General: Skin is warm and dry.     Capillary Refill: Capillary refill takes less than 2 seconds.  Neurological:     Mental Status: She is alert and oriented to person, place, and time.  Psychiatric:        Behavior: Behavior normal.      Musculoskeletal Exam: C-spine, thoracic spine, lumbar spine good range of motion.  No midline spinal tenderness.  No SI joint tenderness.  Shoulder joints, elbow joints, wrist joints, MCPs, PIPs, DIPs have good range of motion with no synovitis.  Complete fist formation bilaterally.  Hip joints have good range of motion with no  groin pain.  Knee joints have good range of motion no warmth or effusion.  Ankle joints have good range of motion with no tenderness or joint swelling.  No evidence of Achilles tendinitis or plantar fasciitis.  No tenderness or synovitis over MTP joints.  CDAI Exam: CDAI Score: -- Patient Global: --; Provider Global: -- Swollen: --; Tender: -- Joint Exam 03/15/2023   No joint exam has been documented for this visit   There is currently no information documented on the homunculus. Go to the Rheumatology activity and complete the homunculus joint exam.  Investigation: No additional findings.  Imaging: No results found.  Recent Labs: Lab Results  Component Value Date   WBC 6.4 02/02/2023   HGB 14.0 02/02/2023   PLT 379 02/02/2023   NA 138 02/02/2023  K 4.0 02/02/2023   CL 106 02/02/2023   CO2 24 02/02/2023   GLUCOSE 79 02/02/2023   BUN 12 02/02/2023   CREATININE 0.72 02/02/2023   BILITOT 0.5 02/02/2023   ALKPHOS 97 12/28/2022   AST 19 02/02/2023   ALT 15 02/02/2023   PROT 7.2 02/02/2023   ALBUMIN 4.2 12/28/2022   CALCIUM 9.6 02/02/2023   GFRAA 141 10/06/2020   QFTBGOLDPLUS NEGATIVE 02/02/2023    Speciality Comments: humira- inadequate response Cimzia started September 29, 2021  Procedures:  No procedures performed Allergies: Patient has no known allergies.   Assessment / Plan:     Visit Diagnoses: Ankylosing spondylitis of sacral region [facility]) - History of chronic SI joint pain, SI joint sclerosis on the x-rays, HLA-B27 positive. SI joints injected on 05/27/2020: She has no synovitis or dactylitis on examination today.  C-spine, thoracic spine, lumbar spine good range of motion.  No midline spinal tenderness.  No SI joint tenderness.  She is not experiencing any morning stiffness, nocturnal pain, or difficulty with ADLs.  Her ankylosing spondylitis remains well-controlled on Cimzia 400 mg sq injections every 28 days.  She is tolerating Cimzia without any side effects or  injection site reactions.  She has not had any gaps in therapy recently. About 1 week prior to injecting Cimzia she has noticed increased abdominal pain, bloating, and left eye pain.  She remains under the care of Dr. Leonides Schanz for management of Crohn's disease.  She tried adding on Imuran as combination therapy but discontinued due to GI side effects.  Briefly discussed possibly adding on methotrexate as combination therapy to help from developing resistance against Cimzia as well as to help treat eye inflammation and possibly to alleviate GI inflammation.  Patient is apprehensive to make any medication changes and would like to further discuss with Dr. Leonides Schanz at her upcoming office visit on 04/07/2023.  She will notify us if any medication changes are made.  A refill of Cimzia was sent to the pharmacy today.  She was advised to notify us if she develops signs or symptoms of a AS flare.  She will follow up in 3 months or sooner if needed.  High risk medication use - Cimzia 400 mg sq injections every 28 days, started September 21, 2021. Contraception: Nexplanon. Not breastfeeding-son is currently 6 months old.  Previous therapy: Humira-inadequate response.  Azathioprine discontinued due to GI side effects. CBC and CMP WNL on 02/02/23. Her next lab work will be due in November and every 3 months.  TB gold negative on 02/02/23.   No recent or recurrent infections.  Discussed the importance of holding cimzia if she develops signs or symptoms of an infection and to resume once the infection has completely cleared.   Iridocyclitis - Dr. Sherryll Burger.  Inadequate response to Humira.  Patient has been experiencing intermittent left eye pain 1 week prior to her Cimzia injections for the last 3 doses.  Discussed that she may benefit from adding on methotrexate as combination therapy.  Patient plans on further discussing combination therapy with her gastroenterologist Dr. Leonides Schanz at her upcoming office visit on 04/07/2023.  HLA B27  positive  Crohn's disease of colon with other complication (HCC) - Under the care of Dr. Leonides Schanz.  Reviewed office visit note from 12/28/2022.  Patient was initiated on Imuran but had to discontinue due to GI side effects. Patient continues to have breakthrough symptoms about 1 week prior to her Cimzia injections.  She experiences abdominal pain and bloating which improves  after injecting Cimzia. She is also been experiencing increased pain involving the left eye about 1 week prior to injecting Cimzia for the last 3 doses.  Discussed that she may benefit from adding methotrexate as combination therapy.  She does not want to make any medication changes at this time and plans on further discussing combination therapy with Dr. Leonides Schanz at her upcoming office visit. Patient is scheduled for an upcoming visit with Dr. Leonides Schanz on 04/07/2023.  Chronic SI joint pain: She has no SI joint tenderness upon palpation today.  No morning stiffness or nocturnal pain.  Other medical conditions are listed as follows:   ANA positive: No clinical features of systemic lupus.   Other acne  Pilonidal cyst  Vaginal high risk HPV DNA test positive  Orders: No orders of the defined types were placed in this encounter.  Meds ordered this encounter  Medications   certolizumab pegol (CIMZIA, 2 SYRINGE,) 200 MG/ML prefilled syringe    Sig: INJECT 2 SYRINGES UNDER THE SKIN EVERY 4 WEEKS    Dispense:  6 mL    Refill:  0    Order Specific Question:   Prescription Type:    Answer:   Renewal     Follow-Up Instructions: Return in about 3 months (around 06/14/2023) for Ankylosing Spondylitis.   Gearldine Bienenstock, PA-C  Note - This record has been created using Dragon software.  Chart creation errors have been sought, but may not always  have been located. Such creation errors do not reflect on  the standard of medical care.

## 2023-03-15 ENCOUNTER — Ambulatory Visit: Payer: Federal, State, Local not specified - PPO | Attending: Physician Assistant | Admitting: Physician Assistant

## 2023-03-15 ENCOUNTER — Encounter: Payer: Self-pay | Admitting: Physician Assistant

## 2023-03-15 VITALS — BP 110/80 | HR 85 | Resp 16 | Ht 68.0 in | Wt 222.6 lb

## 2023-03-15 DIAGNOSIS — G8929 Other chronic pain: Secondary | ICD-10-CM

## 2023-03-15 DIAGNOSIS — Z79899 Other long term (current) drug therapy: Secondary | ICD-10-CM | POA: Diagnosis not present

## 2023-03-15 DIAGNOSIS — Z1589 Genetic susceptibility to other disease: Secondary | ICD-10-CM | POA: Diagnosis not present

## 2023-03-15 DIAGNOSIS — R768 Other specified abnormal immunological findings in serum: Secondary | ICD-10-CM

## 2023-03-15 DIAGNOSIS — R87811 Vaginal high risk human papillomavirus (HPV) DNA test positive: Secondary | ICD-10-CM

## 2023-03-15 DIAGNOSIS — H209 Unspecified iridocyclitis: Secondary | ICD-10-CM

## 2023-03-15 DIAGNOSIS — L708 Other acne: Secondary | ICD-10-CM

## 2023-03-15 DIAGNOSIS — M533 Sacrococcygeal disorders, not elsewhere classified: Secondary | ICD-10-CM

## 2023-03-15 DIAGNOSIS — K50118 Crohn's disease of large intestine with other complication: Secondary | ICD-10-CM

## 2023-03-15 DIAGNOSIS — M458 Ankylosing spondylitis sacral and sacrococcygeal region: Secondary | ICD-10-CM

## 2023-03-15 DIAGNOSIS — L0591 Pilonidal cyst without abscess: Secondary | ICD-10-CM

## 2023-03-15 MED ORDER — CIMZIA (2 SYRINGE) 200 MG/ML ~~LOC~~ PSKT: PREFILLED_SYRINGE | SUBCUTANEOUS | 0 refills | Status: DC

## 2023-03-15 NOTE — Patient Instructions (Signed)

## 2023-04-07 ENCOUNTER — Encounter: Payer: Self-pay | Admitting: Internal Medicine

## 2023-04-07 ENCOUNTER — Other Ambulatory Visit (INDEPENDENT_AMBULATORY_CARE_PROVIDER_SITE_OTHER): Payer: Federal, State, Local not specified - PPO

## 2023-04-07 ENCOUNTER — Ambulatory Visit (INDEPENDENT_AMBULATORY_CARE_PROVIDER_SITE_OTHER): Payer: Federal, State, Local not specified - PPO | Admitting: Internal Medicine

## 2023-04-07 VITALS — BP 120/80 | HR 71 | Ht 68.0 in | Wt 224.0 lb

## 2023-04-07 DIAGNOSIS — K50019 Crohn's disease of small intestine with unspecified complications: Secondary | ICD-10-CM | POA: Diagnosis not present

## 2023-04-07 DIAGNOSIS — K649 Unspecified hemorrhoids: Secondary | ICD-10-CM

## 2023-04-07 LAB — COMPREHENSIVE METABOLIC PANEL
ALT: 16 U/L (ref 0–35)
AST: 16 U/L (ref 0–37)
Albumin: 4.6 g/dL (ref 3.5–5.2)
Alkaline Phosphatase: 92 U/L (ref 39–117)
BUN: 11 mg/dL (ref 6–23)
CO2: 26 meq/L (ref 19–32)
Calcium: 9.9 mg/dL (ref 8.4–10.5)
Chloride: 102 meq/L (ref 96–112)
Creatinine, Ser: 0.7 mg/dL (ref 0.40–1.20)
GFR: 118.12 mL/min (ref >60.00)
Glucose, Bld: 82 mg/dL (ref 70–99)
Potassium: 3.6 meq/L (ref 3.5–5.1)
Sodium: 138 meq/L (ref 135–145)
Total Bilirubin: 0.4 mg/dL (ref 0.2–1.2)
Total Protein: 7.9 g/dL (ref 6.0–8.3)

## 2023-04-07 LAB — CBC WITH DIFFERENTIAL/PLATELET
Basophils Absolute: 0 10*3/uL (ref 0.0–0.1)
Basophils Relative: 0.4 % (ref 0.0–3.0)
Eosinophils Absolute: 0.2 10*3/uL (ref 0.0–0.7)
Eosinophils Relative: 2.2 % (ref 0.0–5.0)
HCT: 43.6 % (ref 36.0–46.0)
Hemoglobin: 14.2 g/dL (ref 12.0–15.0)
Lymphocytes Relative: 28.5 % (ref 12.0–46.0)
Lymphs Abs: 2.3 10*3/uL (ref 0.7–4.0)
MCHC: 32.5 g/dL (ref 30.0–36.0)
MCV: 85.8 fL (ref 78.0–100.0)
Monocytes Absolute: 0.6 10*3/uL (ref 0.1–1.0)
Monocytes Relative: 7.2 % (ref 3.0–12.0)
Neutro Abs: 5 10*3/uL (ref 1.4–7.7)
Neutrophils Relative %: 61.7 % (ref 43.0–77.0)
Platelets: 382 10*3/uL (ref 150.0–400.0)
RBC: 5.09 Mil/uL (ref 3.87–5.11)
RDW: 13.5 % (ref 11.5–15.5)
WBC: 8 10*3/uL (ref 4.0–10.5)

## 2023-04-07 LAB — C-REACTIVE PROTEIN: CRP: <1 mg/dL (ref 0.5–20.0)

## 2023-04-07 LAB — VITAMIN B12: Vitamin B-12: 315 pg/mL (ref 211–911)

## 2023-04-07 MED ORDER — FOLIC ACID 1 MG PO TABS: 1.0000 mg | ORAL_TABLET | Freq: Every day | ORAL | 3 refills | Status: DC

## 2023-04-07 MED ORDER — METHOTREXATE 15 MG/0.6ML ~~LOC~~ SOSY: 15.0000 mg | PREFILLED_SYRINGE | SUBCUTANEOUS | 3 refills | Status: AC

## 2023-04-07 MED ORDER — METHOTREXATE 15 MG/0.6ML ~~LOC~~ SOSY: 15.0000 mg | PREFILLED_SYRINGE | SUBCUTANEOUS | 3 refills | Status: DC

## 2023-04-07 MED ORDER — HYDROCORTISONE (PERIANAL) 2.5 % EX CREA: 1.0000 | TOPICAL_CREAM | Freq: Two times a day (BID) | CUTANEOUS | 0 refills | Status: AC

## 2023-04-07 MED ORDER — METHOTREXATE 15 MG/0.6ML ~~LOC~~ SOSY: 15.0000 mg | PREFILLED_SYRINGE | SUBCUTANEOUS | 0 refills | Status: DC

## 2023-04-07 NOTE — Progress Notes (Signed)
Chief Complaint: Crohn's disease  HPI:    Ms. Vermilyea is a 27 year old female with history of ileal Crohn's disease and ankylosing spondylitis presents for follow up of Crohn's disease  Interval History: Before her next Cimzia dose, she typically has stomach cramps and bloating. Her next dose of Cimzia is due in 1 week and she has not yet experienced some occurrence of bloating.  However for her last dose of Cimzia, she did notice that she had stomach cramps and bloating closer to when her next dose was due. She stopped her azathioprine after 2 weeks of treatment. Azathioprine caused diarrhea and seem to worsen her symptoms so she stopped it.  When she stopped azathioprine, her diarrhea resolved.  She did talk about potential alternatives to azathioprine with her rheumatologist who suggested possibly using methotrexate or increasing the frequency of her Cimzia.  She is on birth control currently (in arm hormonal implant) so she is not concerned about getting pregnant. Her baby boy Christell Faith is about to turn 5 months and is 97th percentile in height (potentially due to the patient's brothers being very tall). Her hemorrhoids are causing rectal pain and bleeding. She on fiber gummies now to help induce regular BMs.  Patient is not breast-feeding.  IBD Characteristics: Type: Crohn's disease Phenotype: Inflammatory, stricturing Perianal involvement: No Location: Ileum (seen on colonoscopy and CT enterography in 2023) Diagnosed: 2023 Extraintestinal manifestations: Iritis, ankylosing spondylitis Previous therapies: Humira (failed due to development of antibodies and loss of drug level) Current therapy: Cimzia (started in 09/2021)   Current Outpatient Medications  Medication Sig Dispense Refill   certolizumab pegol (CIMZIA, 2 SYRINGE,) 200 MG/ML prefilled syringe INJECT 2 SYRINGES UNDER THE SKIN EVERY 4 WEEKS 6 mL 0   cyanocobalamin (VITAMIN B12) 1000 MCG/ML injection 1 ml subcut every x 4 wks then  1 ml subcut monthly and dispense with syringes 4 mL 11   prednisoLONE acetate (PRED FORTE) 1 % ophthalmic suspension as needed.     triamcinolone cream (KENALOG) 0.1 % Apply 1 application. topically 2 (two) times daily as needed. 80 g 3   ZOLOFT 50 MG tablet Take 50 mg by mouth daily.     No current facility-administered medications for this visit.     Physical Exam:  Vital signs: BP 120/80   Pulse 71   Ht 5\' 8"  (1.727 m)   Wt 224 lb (101.6 kg)   BMI 34.06 kg/m   Constitutional:   Pleasant Caucasian female appears to be in NAD, Well developed, Well nourished, alert and cooperative Respiratory: Respirations even and unlabored. Lungs clear to auscultation bilaterally.   No wheezes, crackles, or rhonchi.  Cardiovascular: Normal S1, S2. No MRG. Regular rate Gastrointestinal:  Soft, nontender, nondistended Psychiatric:  Demonstrates good judgement and reason without abnormal affect or behaviors.  RELEVANT LABS AND IMAGING: CBC    Component Value Date/Time   WBC 6.4 02/02/2023 1101   RBC 4.87 02/02/2023 1101   HGB 14.0 02/02/2023 1101   HCT 42.0 02/02/2023 1101   PLT 379 02/02/2023 1101   MCV 86.2 02/02/2023 1101   MCH 28.7 02/02/2023 1101   MCHC 33.3 02/02/2023 1101   RDW 12.0 02/02/2023 1101   LYMPHSABS 1,715 02/02/2023 1101   MONOABS 0.5 12/28/2022 1426   EOSABS 211 02/02/2023 1101   BASOSABS 32 02/02/2023 1101    CMP     Component Value Date/Time   NA 138 02/02/2023 1101   K 4.0 02/02/2023 1101   CL 106 02/02/2023 1101   CO2  24 02/02/2023 1101   GLUCOSE 79 02/02/2023 1101   BUN 12 02/02/2023 1101   CREATININE 0.72 02/02/2023 1101   CALCIUM 9.6 02/02/2023 1101   PROT 7.2 02/02/2023 1101   ALBUMIN 4.2 12/28/2022 1426   AST 19 02/02/2023 1101   ALT 15 02/02/2023 1101   ALKPHOS 97 12/28/2022 1426   BILITOT 0.5 02/02/2023 1101   GFRNONAA >60 11/07/2022 0258   GFRNONAA 122 10/06/2020 1132   GFRAA 141 10/06/2020 1132   Labs 08/2021: Humira level <0.6. Humira  antibody level of 3820.   Labs 11/2021: CBC and CMP unremarkable.  Labs 02/2022: CBC and CMP unremarkable. Fecal calprotectin 100 mg (borderline). Quant gold negative. ESR mildly elevated at 21.   Labs 05/2022: CBC and CMP unremarkable  Labs 08/2022: [facility] pathogen panel. Stool test was positive for enteropathogenic E coli (EPEC). C dif was negative. Fecal calprotectin was elevated at 197. Cimzia level at 39 and antibody level at 246. ESR elevated at 36. CRP elevated at 18.51. CBC nml.   Labs 10/2022: CBC with elevated WBC of 16.9 and low Hb of 11.6. CMP with low Na of 131, low K of 3.2, elevated alk phos of 144, low albumin of 3.2.   Labs 12/2022: CBC normal.  CMP with a mildly low potassium level of 3.4.  Cimzia drug level was 43 and antibody levels 511.  CRP is elevated at 14.8.  ESR is elevated at 31.  Vitamin B12 was low at 202.  Vitamin D, iron, and folate were normal.  TPMT enzyme activity was normal.  Fecal calprotectin normal.  Labs 01/2023: CBC normal.  CMP normal.  QuantiFERON gold negative.  CT enterography 09/06/21: IMPRESSION: 1. There is short segment circumferential wall thickening and mucosal hyperenhancement of the terminal ileum, involving a segment approximately 4 cm in length from the ileocecal valve. This appearance is in keeping with Crohn's ileitis. No evidence of complicating stricture, fistula, or abscess at this time. No other evidence of bowel inflammation. 2. IUD is present in the uterus, although in an abnormal appearing low lying position, within the lower uterine segment and or endocervical canal. Consider pelvic ultrasound to assess appropriate positioning. 3. No osseous stigmata of ankylosing spondylitis by CT.  Colonoscopy 08/16/21: - Inflammed and strictured terminal ileum, otherwise the examination was normal. - Biopsies taken from terminal ileum, right and left colon. - The examination was otherwise normal on direct and retroflexion views. Path: 1.  Surgical [P], small bowel, terminal ileum - SEVERELY ACTIVE CHRONIC, NONSPECIFIC ILEITIS - NO GRANULOMAS, DYSPLASIA OR MALIGNANCY IDENTIFIED - SEE COMMENT 2. Surgical [P], right colon biopsy - BENIGN COLONIC MUCOSA - NO ACTIVE INFLAMMATION OR EVIDENCE OF MICROSCOPIC COLITIS - NO HIGH-GRADE DYSPLASIA OR MALIGNANCY IDENTIFIED 3. Surgical [P], left colon biopsy - BENIGN COLONIC MUCOSA - NO ACTIVE INFLAMMATION OR EVIDENCE OF MICROSCOPIC COLITIS - NO HIGH-GRADE DYSPLASIA OR MALIGNANCY IDENTIFIED Microscopic Comment 1. The biopsy is scant and not typical of inflammatory bowel disease. The differential diagnosis would include drugs, infection and inflammatory bowel disease. Radiologic correlation and patient follow-up is suggested. Dr. Kenard Gower reviewed the case and agrees with the above diagnosis.  Assessment: Small bowel Crohn's disease Hemorrhoids Patient presents for follow up of ileal Crohn's disease.  Patient has been doing relatively well.  She was not able to tolerate using azathioprine therapy due to development of diarrhea.  Thus we will attempt to start the patient on methotrexate instead.  Methotrexate is an adequate substitute for azathioprine in trying to reduce her Cimzia  antibody levels.  I counseled the patient on the side effects of methotrexate including elevated liver test, decreased blood counts, allergic reactions, increased risk of infections, and a low increase in risk of malignancy.  Patient was also notified that she cannot become pregnant while she is on methotrexate because the methotrexate is a teratogen.  Patient is currently on birth control at this time.  Will recheck her labs today and get her started on methotrexate with folic acid.  Patient is also having some difficulty with hemorrhoids at this time so we will send her some Anusol cream to see if this helps with her symptoms.  Plan: - Plan to check labs with CBC, CMP, CRP, Cimzia level and antibody, vitamin B12 -  Continue Cimzia - Start methotrexate 15 mcg SQ weekly - Start folic acid 1 mg QD - Send Anusol HC cream BID for 7 days - Will need hepatitis B vaccination in the future - Patient will establish with new dermatologist - RTC in 3 months  I spent 44 minutes of time, including in depth chart review, independent review of results as outlined above, communicating results with the patient directly, face-to-face time with the patient, coordinating care, ordering studies and medications as appropriate, and documentation.

## 2023-04-07 NOTE — Patient Instructions (Addendum)
Your provider has requested that you go to the basement level for lab work before leaving today. Press "B" on the elevator. The lab is located at the first door on the left as you exit the elevator.  We have sent the following medications to your pharmacy for you to pick up at your convenience: Folic acid, Anusol,  methotrexate 15 mcg subcutaneous weekly    If your blood pressure at your visit was 140/90 or greater, please contact your primary care physician to follow up on this.  _______________________________________________________  If you are age 28 or older, your body mass index should be between 23-30. Your Body mass index is 34.06 kg/m. If this is out of the aforementioned range listed, please consider follow up with your Primary Care Provider.  If you are age 89 or younger, your body mass index should be between 19-25. Your Body mass index is 34.06 kg/m. If this is out of the aformentioned range listed, please consider follow up with your Primary Care Provider.   ________________________________________________________  The [facility] providers would like to encourage you to use [facility] to communicate with providers for non-urgent requests or questions.  Due to long hold times on the telephone, sending your provider a message by [facility] may be a faster and more efficient way to get a response.  Please allow 48 business hours for a response.  Please remember that this is for non-urgent requests.    Thank you for entrusting me with your care and for choosing [facility],  Dr. Eulah Pont

## 2023-04-11 ENCOUNTER — Telehealth: Payer: Self-pay | Admitting: Internal Medicine

## 2023-04-11 NOTE — Telephone Encounter (Signed)
Inbound call from [facility] waiting clarification on order.  [phone]

## 2023-04-11 NOTE — Telephone Encounter (Signed)
Returned pharmacists call about methotrexate prescription. Pharmacist states methotrexate comes in multidose or single dose vials. Pharmacist states that the single dose vials are 1mg /40 mL and the multidose are 2mg /66mL vials. The pharmacist states with the multidose vial patient can get around 3 weeks of methotrexate out of one vial or patient will have discard the rest of the single dose vial after use. Gave verbal order for patient to have 50mg /2 mL methotrexate injection 0.6 mL SQ every 7 days with 2 additional refills. Dr. Leonides Schanz, is the patient aware on how to give herself these injections?

## 2023-04-11 NOTE — Telephone Encounter (Signed)
Inbound call from pharmacy stating they need clarification for Methotrexate. Requesting a call to discuss. Please advise.

## 2023-04-11 NOTE — Telephone Encounter (Signed)
Left message for patient to return my call.

## 2023-04-12 NOTE — Telephone Encounter (Signed)
Explained to patient that methotrexate injections are not pre-filled and comes in a multi-dose vial. Also, they need to be administered subcutaneous in her abdomen . I asked patient if she felt comfortable drawing up the medication and administering it to herself or does she want someone to instruct her how to administer the medication. Patient reports she has drawn up several other medications and administered them to herself previously. Also, informed patient that the pharmacy may not send the appropriate syringes and needles but if they do not then to contact our office. We can provide TB syringes.  Patient verbalized understanding and will call us with any questions or concerns.

## 2023-04-18 LAB — SERIAL MONITORING

## 2023-04-19 LAB — CERTOLIZUMAB AND ANTI-CERTO AB
Anti-Certolizumab Ab Level: 1657 ng/mL
Certolizumab DRUG Level: 21 ug/mL

## 2023-05-22 DIAGNOSIS — L814 Other melanin hyperpigmentation: Secondary | ICD-10-CM | POA: Diagnosis not present

## 2023-05-22 DIAGNOSIS — D225 Melanocytic nevi of trunk: Secondary | ICD-10-CM | POA: Diagnosis not present

## 2023-05-22 DIAGNOSIS — Z7189 Other specified counseling: Secondary | ICD-10-CM | POA: Diagnosis not present

## 2023-05-23 ENCOUNTER — Ambulatory Visit: Payer: Federal, State, Local not specified - PPO | Admitting: Family Medicine

## 2023-05-29 ENCOUNTER — Other Ambulatory Visit: Payer: Self-pay | Admitting: Internal Medicine

## 2023-05-31 ENCOUNTER — Ambulatory Visit (INDEPENDENT_AMBULATORY_CARE_PROVIDER_SITE_OTHER): Payer: Federal, State, Local not specified - PPO | Admitting: Family Medicine

## 2023-05-31 VITALS — BP 102/74 | HR 75 | Temp 98.1°F | Ht 69.49 in | Wt 236.6 lb

## 2023-05-31 DIAGNOSIS — Z131 Encounter for screening for diabetes mellitus: Secondary | ICD-10-CM

## 2023-05-31 DIAGNOSIS — Z0000 Encounter for general adult medical examination without abnormal findings: Secondary | ICD-10-CM

## 2023-05-31 DIAGNOSIS — Z23 Encounter for immunization: Secondary | ICD-10-CM | POA: Diagnosis not present

## 2023-05-31 DIAGNOSIS — E669 Obesity, unspecified: Secondary | ICD-10-CM

## 2023-05-31 DIAGNOSIS — Z111 Encounter for screening for respiratory tuberculosis: Secondary | ICD-10-CM | POA: Diagnosis not present

## 2023-05-31 DIAGNOSIS — Z13220 Encounter for screening for lipoid disorders: Secondary | ICD-10-CM

## 2023-05-31 LAB — LIPID PANEL
Cholesterol: 178 mg/dL (ref 0–200)
HDL: 71.3 mg/dL (ref >39.00)
LDL Cholesterol: 91 mg/dL (ref 0–99)
NonHDL: 106.39
Total CHOL/HDL Ratio: 2
Triglycerides: 78 mg/dL (ref 0.0–149.0)
VLDL: 15.6 mg/dL (ref 0.0–40.0)

## 2023-05-31 LAB — TSH: TSH: 2.24 u[IU]/mL (ref 0.35–5.50)

## 2023-05-31 NOTE — Progress Notes (Signed)
Patient ID: Toni Livingston, female  DOB: [birth year], 27 y.o.   MRN: [id number] Patient Care Team    Relationship Specialty Notifications Start End  Natalia Leatherwood, DO PCP - General Family Medicine  07/27/16   Patricia Nettle, MD  Orthopedic Surgery  04/16/19   Everardo Pacific, OD Referring Physician Optometry  04/16/19   Pollyann Savoy, MD Consulting Physician Rheumatology  11/11/19   Noland Fordyce, MD Consulting Physician Obstetrics and Gynecology  05/31/23     Chief Complaint  Patient presents with   Annual Exam    Pt is not fasting; pap requested    Subjective:  Toni Livingston is a 27 y.o.  Female  present for CPE. All past medical history, surgical history, allergies, family history, immunizations, medications and social history were updated in the electronic medical record today. All recent labs, ED visits and hospitalizations within the last year were reviewed.  Health maintenance:  Mammogram: routine screen at 39- no fhx.  Cervical cancer screening: UTD-pt has Theatre manager w Immunizations: tdap UTD 2018, Influenza-completed today-(encouraged yearly). Infectious disease screening: HIV completed.  Hepatitis C screening completed Assistive device: none Oxygen VHQ:IONG Patient has a Dental home. Hospitalizations/ED visits: Reviewed     05/31/2023    1:50 PM 06/04/2021    3:01 PM 11/14/2018   10:38 AM 09/13/2017    9:29 AM 07/27/2016    9:07 AM  Depression screen PHQ 2/9  Decreased Interest 0 0 0 0 0  Down, Depressed, Hopeless 0 0 0 0 0  PHQ - 2 Score 0 0 0 0 0       No data to display           Immunization History  Administered Date(s) Administered   Influenza, Seasonal, Injecte, Preservative Fre 05/31/2023   Influenza,inj,Quad PF,6+ Mos 03/09/2017, 03/05/2018, 02/27/2019, 06/04/2021   Influenza-Unspecified 03/20/2016, 04/04/2022   PFIZER(Purple Top)SARS-COV-2 Vaccination 03/23/2020, 04/13/2020, 08/07/2020   PPD Test 07/27/2016    Tdap 07/27/2016    Past Medical History:  Diagnosis Date   Ankylosing spondylitis (HCC)    Closed fracture of fifth metacarpal bone 07/19/2019   Crohn's disease (HCC)    Iritis    LGSIL of cervix of undetermined significance 11/14/2018   Normal labor 11/07/2022   SVD (spontaneous vaginal delivery) 11/07/2022   Vaginal high risk HPV DNA test positive 11/19/2018   Vertigo    No Known Allergies Past Surgical History:  Procedure Laterality Date   BREAST REDUCTION SURGERY  2015   COLONOSCOPY  07/2021   HAND SURGERY Left 07/24/2019   Family History  Problem Relation Age of Onset   Hypertension Mother    Colon polyps Father    Testicular cancer Father    Healthy Sister    Depression Sister    Anxiety disorder Sister    Healthy Brother    Healthy Brother    Uterine cancer Maternal Aunt    Diabetes Maternal Grandmother    Emphysema Maternal Grandmother    Breast cancer Maternal Grandmother    Diabetes Maternal Grandfather    Healthy Son    Esophageal cancer Neg Hx    Social History   Social History Narrative   Single. Some college.    Works as Merchant navy officer.    Drinks caffeine.    Wears seatbelt. Smoke detector in the home.    Exercises routinely.    Feels safe in relationships.        Allergies as of 05/31/2023   No Known Allergies  Medication List        Accurate as of May 31, 2023  2:09 PM. If you have any questions, ask your nurse or doctor.          Cimzia (2 Syringe) 200 MG/ML prefilled syringe Generic drug: certolizumab pegol INJECT 2 SYRINGES UNDER THE SKIN EVERY 4 WEEKS   cyanocobalamin 1000 MCG/ML injection Commonly known as: VITAMIN B12 1 ml subcut every x 4 wks then 1 ml subcut monthly and dispense with syringes   folic acid 1 MG tablet Commonly known as: FOLVITE   methotrexate 50 MG/2ML injection INJECT 0.6 ML UNDER THE SKIN ONCE EVERY 7 DAYS. DISCARD UNUSED REMAINDER 30 DAYS AFTER INITIAL USE   prednisoLONE acetate 1  % ophthalmic suspension Commonly known as: PRED FORTE as needed.   triamcinolone cream 0.1 % Commonly known as: KENALOG Apply 1 application. topically 2 (two) times daily as needed.   Zoloft 50 MG tablet Generic drug: sertraline Take 50 mg by mouth daily.        All past medical history, surgical history, allergies, family history, immunizations andmedications were updated in the EMR today and reviewed under the history and medication portions of their EMR.     ROS: 14 pt review of systems performed and negative (unless mentioned in an HPI)  Objective: BP 102/74   Pulse 75   Temp 98.1 F (36.7 C)   Ht 5' 9.49" (1.765 m)   Wt 236 lb 9.6 oz (107.3 kg)   LMP 05/26/2023   SpO2 98%   Breastfeeding No   BMI 34.45 kg/m  Physical Exam Vitals and nursing note reviewed.  Constitutional:      General: She is not in acute distress.    Appearance: Normal appearance. She is not ill-appearing or toxic-appearing.  HENT:     Head: Normocephalic and atraumatic.     Right Ear: Tympanic membrane, ear canal and external ear normal. There is no impacted cerumen.     Left Ear: Tympanic membrane, ear canal and external ear normal. There is no impacted cerumen.     Nose: No congestion or rhinorrhea.     Mouth/Throat:     Mouth: Mucous membranes are moist.     Pharynx: Oropharynx is clear. No oropharyngeal exudate or posterior oropharyngeal erythema.  Eyes:     General: No scleral icterus.       Right eye: No discharge.        Left eye: No discharge.     Extraocular Movements: Extraocular movements intact.     Conjunctiva/sclera: Conjunctivae normal.     Pupils: Pupils are equal, round, and reactive to light.  Cardiovascular:     Rate and Rhythm: Normal rate and regular rhythm.     Pulses: Normal pulses.     Heart sounds: Normal heart sounds. No murmur heard.    No friction rub. No gallop.  Pulmonary:     Effort: Pulmonary effort is normal. No respiratory distress.     Breath sounds:  Normal breath sounds. No stridor. No wheezing, rhonchi or rales.  Chest:     Chest wall: No tenderness.  Abdominal:     General: Abdomen is flat. Bowel sounds are normal. There is no distension.     Palpations: Abdomen is soft. There is no mass.     Tenderness: There is no abdominal tenderness. There is no right CVA tenderness, left CVA tenderness, guarding or rebound.     Hernia: No hernia is present.  Musculoskeletal:  General: No swelling, tenderness or deformity. Normal range of motion.     Cervical back: Normal range of motion and neck supple. No rigidity or tenderness.     Right lower leg: No edema.     Left lower leg: No edema.  Lymphadenopathy:     Cervical: No cervical adenopathy.  Skin:    General: Skin is warm and dry.     Coloration: Skin is not jaundiced or pale.     Findings: No bruising, erythema, lesion or rash.  Neurological:     General: No focal deficit present.     Mental Status: She is alert and oriented to person, place, and time. Mental status is at baseline.     Cranial Nerves: No cranial nerve deficit.     Sensory: No sensory deficit.     Motor: No weakness.     Coordination: Coordination normal.     Gait: Gait normal.     Deep Tendon Reflexes: Reflexes normal.  Psychiatric:        Mood and Affect: Mood normal.        Behavior: Behavior normal.        Thought Content: Thought content normal.        Judgment: Judgment normal.     No results found.  Assessment/plan: Toni Livingston is a 27 y.o. female present for CPE Encounter for preventive health examination Patient was encouraged to exercise greater than 150 minutes a week. Patient was encouraged to choose a diet filled with fresh fruits and vegetables, and lean meats. AVS provided to patient today for education/recommendation on gender specific health and safety maintenance. Mammogram: routine screen at 40- no fhx.  Cervical cancer screening: UTD-pt has Theatre manager  w Immunizations: tdap UTD 2018, Influenza-completed today(encouraged yearly). Infectious disease screening: HIV completed.  Hepatitis C screening completed - TSH UTD tdap, unknown MMR and hep B (she will look for childhood records) Influenza vaccine needed - Flu vaccine trivalent PF, 6mos and older(Flulaval,Afluria,Fluarix,Fluzone) Lipid screening - Lipid panel Tuberculosis screening - QuantiFERON-TB Gold Plus  Form received today for [employer]. Once TB screen results, we will complete and call pt.   Return in about 1 year (around 05/31/2024) for cpe (20 min).   Orders Placed This Encounter  Procedures   Flu vaccine trivalent PF, 6mos and older(Flulaval,Afluria,Fluarix,Fluzone)   TSH   Lipid panel   QuantiFERON-TB Gold Plus   No orders of the defined types were placed in this encounter.  Referral Orders  No referral(s) requested today     Electronically signed by: Felix Pacini, DO [facility]

## 2023-05-31 NOTE — Patient Instructions (Addendum)
Return in about 1 year (around 05/31/2024) for cpe (20 min).        Great to see you today.  I have refilled the medication(s) we provide.   If labs were collected or images ordered, we will inform you of  results once we have received them and reviewed. We will contact you either by echart message, or telephone call.  Please give ample time to the testing facility, and our office to run,  receive and review results. Please do not call inquiring of results, even if you can see them in your chart. We will contact you as soon as we are able. If it has been over 1 week since the test was completed, and you have not yet heard from [place], then please call us.    - echart message- for normal results that have been seen by the patient already.   - telephone call: abnormal results or if patient has not viewed results in their echart.  If a referral to a specialist was entered for you, please call us in 2 weeks if you have not heard from the specialist office to schedule.

## 2023-06-03 LAB — QUANTIFERON-TB GOLD PLUS
Mitogen-NIL: >10 [IU]/mL
NIL: 0.07 [IU]/mL
QuantiFERON-TB Gold Plus: NEGATIVE
TB1-NIL: 0.02 [IU]/mL
TB2-NIL: 0 [IU]/mL

## 2023-06-05 ENCOUNTER — Telehealth: Payer: Self-pay | Admitting: Family Medicine

## 2023-06-05 NOTE — Telephone Encounter (Signed)
Please inform patient the following information: TB screen is negative Form completed and signed for patient nad placed on CMA work basket

## 2023-06-05 NOTE — Telephone Encounter (Signed)
Spoke with patient regarding results/recommendations.  

## 2023-06-06 NOTE — Telephone Encounter (Signed)
[facility] requesting call back [phone] in regards to previous medication.

## 2023-06-09 NOTE — Telephone Encounter (Signed)
Dr. Leonides Schanz patient

## 2023-06-09 NOTE — Telephone Encounter (Signed)
[facility] stated they need to go over directions for the injection.

## 2023-06-12 ENCOUNTER — Other Ambulatory Visit: Payer: Self-pay

## 2023-06-12 MED ORDER — METHOTREXATE SODIUM CHEMO INJECTION 50 MG/2ML: INTRAMUSCULAR | Status: DC

## 2023-06-12 NOTE — Telephone Encounter (Signed)
Spoke with the specialty pharmacy.  Dispensing amount adjusted to dispense 2 vials of 2 ml. Try for dispensing a 45 day supply if insurance will allow. Hopefully this will help with compliance instead of needing to reorder frequently.

## 2023-06-15 ENCOUNTER — Telehealth: Payer: Self-pay | Admitting: *Deleted

## 2023-06-15 ENCOUNTER — Other Ambulatory Visit (HOSPITAL_COMMUNITY): Payer: Self-pay

## 2023-06-15 NOTE — Telephone Encounter (Signed)
Unable to pull insurance through [employer] or [employer]. MyChart message sent to patient for update  Chesley Mires, PharmD, MPH, BCPS, CPP Clinical Pharmacist (Rheumatology and Pulmonology)

## 2023-06-15 NOTE — Telephone Encounter (Signed)
Received a call from [facility] with [facility] verification. Requesting a call back at [phone].  They are trying to run her Cimzia and it is stating the patient's insurance has terminated. They would like a call back to advise if patient has any other insurance on file.

## 2023-06-16 NOTE — Progress Notes (Signed)
 Office Visit Note  Patient: Toni Livingston             Date of Birth: [birth year]           MRN: [id number]             PCP: Catherine Charlies LABOR, DO Referring: Catherine Charlies LABOR, DO Visit Date: 06/28/2023 Occupation: @GUAROCC @  Subjective:  Medication monitoring-methotrexate  added   History of Present Illness: Toni Livingston is a 27 y.o. female with history of ankylosing spondylitis and crohn's disease.  Patient remains on cimzia  400 mg sq injections every 28 days.  Patient was initiated on methotrexate  0.6 ml sq injections once weekly and folic acid  1 mg daily in October 2024.  She has been tolerating methotrexate  without any side effects or injection site reactions.  She has not had any recent gaps in therapy.  She has noticed a gradual improvement in her symptoms since adding on methotrexate  as combination therapy.  She has had less bloating, abdominal pain, and diarrhea since initiating methotrexate .  She has not had any signs or symptoms of uveitis flare recently.  Patient continues to experience occasional soreness in her SI joints especially with picking up her 14-month-old and getting up and down from the floor.  Her lower back pain typically improves with rest.  She denies any other joint pain or joint swelling at this time.  She denies any nocturnal pain.  She denies any Achilles tendinitis or plantar fasciitis.  She denies any recent or recurrent infections.  She denies any new medical conditions.      Activities of Daily Living:  Patient reports morning stiffness for 0  minutes.   Patient Denies nocturnal pain.  Difficulty dressing/grooming: Denies Difficulty climbing stairs: Denies Difficulty getting out of chair: Denies Difficulty using hands for taps, buttons, cutlery, and/or writing: Denies  Review of Systems  Constitutional: Negative.  Negative for fatigue.  HENT: Negative.  Negative for mouth sores and mouth dryness.   Eyes: Negative.  Negative for  dryness.  Respiratory: Negative.  Negative for shortness of breath.   Cardiovascular: Negative.  Negative for chest pain and palpitations.  Gastrointestinal: Negative.  Negative for blood in stool, constipation and diarrhea.  Endocrine: Negative.  Negative for increased urination.  Genitourinary: Negative.  Negative for involuntary urination.  Musculoskeletal: Negative.  Negative for joint pain, gait problem, joint pain, joint swelling, myalgias, muscle weakness, morning stiffness, muscle tenderness and myalgias.  Skin: Negative.  Negative for color change, rash, hair loss and sensitivity to sunlight.  Allergic/Immunologic: Negative for susceptible to infections.  Neurological: Negative.  Negative for dizziness and headaches.  Hematological: Negative.  Negative for swollen glands.  Psychiatric/Behavioral:  Negative for depressed mood and sleep disturbance. The patient is not nervous/anxious.     PMFS History:  Patient Active Problem List   Diagnosis Date Noted   (BMI 30-39.9)-E66.9 05/31/2023    Past Medical History:  Diagnosis Date   Ankylosing spondylitis (HCC)    Closed fracture of fifth metacarpal bone 07/19/2019   Crohn's disease (HCC)    Iritis    LGSIL of cervix of undetermined significance 11/14/2018   Normal labor 11/07/2022   SVD (spontaneous vaginal delivery) 11/07/2022   Vaginal high risk HPV DNA test positive 11/19/2018   Vertigo     Family History  Problem Relation Age of Onset   Hypertension Mother    Colon polyps Father    Testicular cancer Father    Healthy Sister    Depression Sister  Anxiety disorder Sister    Healthy Brother    Healthy Brother    Uterine cancer Maternal Aunt    Diabetes Maternal Grandmother    Emphysema Maternal Grandmother    Breast cancer Maternal Grandmother    Diabetes Maternal Grandfather    Healthy Son    Esophageal cancer Neg Hx    Past Surgical History:  Procedure Laterality Date   BREAST REDUCTION SURGERY  2015    COLONOSCOPY  07/2021   HAND SURGERY Left 07/24/2019   Social History   Social History Narrative   Single. Some college.    Works as merchant [employer] officer.    Drinks caffeine.    Wears seatbelt. Smoke detector in the home.    Exercises routinely.    Feels safe in relationships.       Immunization History  Administered Date(s) Administered   Influenza, Seasonal, Injecte, Preservative Fre 05/31/2023   Influenza,inj,Quad PF,6+ Mos 03/09/2017, 03/05/2018, 02/27/2019, 06/04/2021   Influenza-Unspecified 03/20/2016, 04/04/2022   PFIZER(Purple Top)SARS-COV-2 Vaccination 03/23/2020, 04/13/2020, 08/07/2020   PPD Test 07/27/2016   Tdap 07/27/2016     Objective: Vital Signs: BP 116/76   Pulse 91   Resp 16   Ht 5' 9.49 (1.765 m)   Wt 237 lb (107.5 kg)   LMP 06/26/2023   Breastfeeding No   BMI 34.51 kg/m    Physical Exam Vitals and nursing note reviewed.  Constitutional:      Appearance: She is well-developed.  HENT:     Head: Normocephalic and atraumatic.  Eyes:     Conjunctiva/sclera: Conjunctivae normal.  Cardiovascular:     Rate and Rhythm: Normal rate and regular rhythm.     Heart sounds: Normal heart sounds.  Pulmonary:     Effort: Pulmonary effort is normal.     Breath sounds: Normal breath sounds.  Abdominal:     General: Bowel sounds are normal.     Palpations: Abdomen is soft.  Musculoskeletal:     Cervical back: Normal range of motion.  Lymphadenopathy:     Cervical: No cervical adenopathy.  Skin:    General: Skin is warm and dry.     Capillary Refill: Capillary refill takes less than 2 seconds.  Neurological:     Mental Status: She is alert and oriented to person, place, and time.  Psychiatric:        Behavior: Behavior normal.      Musculoskeletal Exam: C-spine, thoracic spine, lumbar spine and good range of motion.  No midline spinal tenderness.  No SI joint tenderness upon palpation.  Shoulder joints, elbow joints, wrist joints, MCPs, PIPs, DIPs have  good range of motion with no synovitis.  Complete fist formation bilaterally.  Knee joints have good range of motion with no warmth or effusion.  Ankle joints have good range of motion with no tenderness or joint swelling.  No evidence of Achilles tendinitis.  CDAI Exam: CDAI Score: -- Patient Global: --; Provider Global: -- Swollen: --; Tender: -- Joint Exam 06/28/2023   No joint exam has been documented for this visit   There is currently no information documented on the homunculus. Go to the Rheumatology activity and complete the homunculus joint exam.  Investigation: No additional findings.  Imaging: No results found.  Recent Labs: Lab Results  Component Value Date   WBC 8.0 04/07/2023   HGB 14.2 04/07/2023   PLT 382.0 04/07/2023   NA 138 04/07/2023   K 3.6 04/07/2023   CL 102 04/07/2023   CO2 26 04/07/2023  GLUCOSE 82 04/07/2023   BUN 11 04/07/2023   CREATININE 0.70 04/07/2023   BILITOT 0.4 04/07/2023   ALKPHOS 92 04/07/2023   AST 16 04/07/2023   ALT 16 04/07/2023   PROT 7.9 04/07/2023   ALBUMIN 4.6 04/07/2023   CALCIUM  9.9 04/07/2023   GFRAA 141 10/06/2020   QFTBGOLDPLUS NEGATIVE 05/31/2023    Speciality Comments: humira - inadequate response Cimzia  started September 29, 2021  Procedures:  No procedures performed Allergies: Patient has no known allergies.     Assessment / Plan:     Visit Diagnoses: Ankylosing spondylitis of sacral region [facility]) - History of chronic SI joint pain, SI joint sclerosis on the x-rays, HLA-B27 positive. SI joints injected on 05/27/2020: Patient has not had any signs or symptoms of an ankylosing spondylitis flare recently.  She experiences intermittent soreness involving both SI joints which is typically exacerbated by carrying her 55-month-old for prolonged periods of time or getting up and down from the floor.  Her SI joint pain improves with rest.  She had no SI joint tenderness upon palpation today.  No evidence of Achilles tendinitis  or plantar fasciitis.  No synovitis or dactylitis noted.  She has not had any recent uveitis flares. Patient remains on Cimzia  400 mg sq injections once monthly.  Injectable methotrexate  0.6 ml sq injections once weekly was initiated by GI in October 2024.  She has been tolerating methotrexate  without any side effects or injection site reactions.  She has started to notice clinical benefit on combination therapy including less abdominal pain, bloating, and diarrhea.  She has also had less frequent episodes of eye pain and redness.  Patient will remain on combination therapy as prescribed.  She was advised to notify us  if she develops signs or symptoms of a flare.  She will follow-up in the office in 5 months or sooner if needed.  High risk medication use -Cimzia  400 mg sq injections every 28 days, started September 21, 2021. Methotrexate  0.6 ml sq injections once weekly and folic acid  1 mg daily.  Injectable methotrexate  was added in October 2024. Contraception: Nexplanon . Not breastfeeding-son is currently 31 months old.  Currently has a Nexplanon  for contraception. Previous therapy: Humira -inadequate response.  Azathioprine  discontinued due to GI side effects. TB gold negative on 05/31/23.  CBC and CMP updated on 04/07/23. Orders for CBC and CMP released today.   No recent or recurrent infections.  Discussed the importance of holding cimzia  and methotrexate  if she develops signs or symptoms of an infection and to resume once the infection has completely cleared.   Plan: CBC with Differential/Platelet, COMPLETE METABOLIC PANEL WITH GFR  Iridocyclitis:  Dr. Maree.  Inadequate response to Humira . No signs of active inflammation on ocular exam 08/30/22.  Currently on Cimzia  monthly injections.  Methotrexate  was added as combination therapy due to recurrent uveitis flares and ongoing diarrhea secodary to crohn's disease.  She has not had any signs or symptoms of uveitis flare.  She will remain on combination  therapy as prescribed.  HLA B27 positive  Crohn's disease of colon with other complication [facility]): Crohn's disease of colon with other complication (HCC) - Under the care of Dr. Federico.  Reviewed recent OV note from 04/07/23-sq methotrexate  was added.  She has been experiencing less bloating, abdominal pain, diarrhea since adding on methotrexate  as combination therapy.  Patient will remain on Cimzia  as prescribed. Next scheduled appointment is on 07/26/23.    Chronic SI joint pain: She experiences intermittent soreness in her SI joints but  has no tenderness upon palpation today.  No nocturnal pain.  Patient will remain on Cimzia  and methotrexate  as combination therapy.  ANA positive: No clinical features of systemic lupus.  Other medical conditions are listed as follows:  Other acne  Pilonidal cyst  Vaginal high risk HPV DNA test positive  Orders: Orders Placed This Encounter  Procedures   CBC with Differential/Platelet   COMPLETE METABOLIC PANEL WITH GFR   No orders of the defined types were placed in this encounter.    Follow-Up Instructions: Return in about 5 months (around 11/26/2023) for Ankylosing Spondylitis.   Toni CHRISTELLA Craze, PA-C  Note - This record has been created using Dragon software.  Chart creation errors have been sought, but may not always  have been located. Such creation errors do not reflect on  the standard of medical care.

## 2023-06-19 ENCOUNTER — Ambulatory Visit: Payer: Federal, State, Local not specified - PPO | Admitting: Physician Assistant

## 2023-06-23 ENCOUNTER — Other Ambulatory Visit (HOSPITAL_COMMUNITY): Payer: Self-pay

## 2023-06-23 ENCOUNTER — Telehealth: Payer: Self-pay | Admitting: Pharmacist

## 2023-06-23 DIAGNOSIS — H209 Unspecified iridocyclitis: Secondary | ICD-10-CM

## 2023-06-23 DIAGNOSIS — K50118 Crohn's disease of large intestine with other complication: Secondary | ICD-10-CM

## 2023-06-23 DIAGNOSIS — Z79899 Other long term (current) drug therapy: Secondary | ICD-10-CM

## 2023-06-23 DIAGNOSIS — M458 Ankylosing spondylitis sacral and sacrococcygeal region: Secondary | ICD-10-CM

## 2023-06-23 NOTE — Telephone Encounter (Signed)
 Patient switching ti [employer] as of new year. Per [facility] eligibility check new insurance may be following:   Submitted an URGENT Prior Authorization request to  [employer]  for CIMZIA  via CoverMyMeds. Will update once we receive a response.  Key: [id number]  Sherry Pennant, PharmD, MPH, BCPS, CPP Clinical Pharmacist (Rheumatology and Pulmonology)

## 2023-06-27 ENCOUNTER — Encounter: Payer: Self-pay | Admitting: Pharmacist

## 2023-06-27 MED ORDER — CIMZIA (2 SYRINGE) 200 MG/ML ~~LOC~~ PSKT: PREFILLED_SYRINGE | SUBCUTANEOUS | 0 refills | Status: DC

## 2023-06-27 NOTE — Telephone Encounter (Addendum)
 Received notification from  [employer]  regarding a prior authorization for CIMZIA . Authorization has been APPROVED from 06/23/2023 to 06/22/2024. Approval letter sent to scan center.  Patient must fill through  [facility] [phone])  Authorization # [id number]  Spoke with patient - she requested MyChart message with pharmacy infrmation and savings card information  Sherry Pennant, PharmD, MPH, BCPS, CPP Clinical Pharmacist (Rheumatology and Pulmonology)

## 2023-06-28 ENCOUNTER — Encounter: Payer: Self-pay | Admitting: Physician Assistant

## 2023-06-28 ENCOUNTER — Ambulatory Visit: Payer: BC Managed Care – PPO | Attending: Physician Assistant | Admitting: Physician Assistant

## 2023-06-28 VITALS — BP 116/76 | HR 91 | Resp 16 | Ht 69.49 in | Wt 237.0 lb

## 2023-06-28 DIAGNOSIS — Z79899 Other long term (current) drug therapy: Secondary | ICD-10-CM | POA: Diagnosis not present

## 2023-06-28 DIAGNOSIS — M533 Sacrococcygeal disorders, not elsewhere classified: Secondary | ICD-10-CM

## 2023-06-28 DIAGNOSIS — H209 Unspecified iridocyclitis: Secondary | ICD-10-CM | POA: Diagnosis not present

## 2023-06-28 DIAGNOSIS — L0591 Pilonidal cyst without abscess: Secondary | ICD-10-CM

## 2023-06-28 DIAGNOSIS — Z1589 Genetic susceptibility to other disease: Secondary | ICD-10-CM

## 2023-06-28 DIAGNOSIS — R768 Other specified abnormal immunological findings in serum: Secondary | ICD-10-CM

## 2023-06-28 DIAGNOSIS — R87811 Vaginal high risk human papillomavirus (HPV) DNA test positive: Secondary | ICD-10-CM

## 2023-06-28 DIAGNOSIS — L708 Other acne: Secondary | ICD-10-CM

## 2023-06-28 DIAGNOSIS — M458 Ankylosing spondylitis sacral and sacrococcygeal region: Secondary | ICD-10-CM

## 2023-06-28 DIAGNOSIS — G8929 Other chronic pain: Secondary | ICD-10-CM

## 2023-06-28 DIAGNOSIS — K50118 Crohn's disease of large intestine with other complication: Secondary | ICD-10-CM

## 2023-06-29 LAB — COMPLETE METABOLIC PANEL WITH GFR
AG Ratio: 1.7 (calc) (ref 1.0–2.5)
ALT: 19 U/L (ref 6–29)
AST: 18 U/L (ref 10–30)
Albumin: 4.8 g/dL (ref 3.6–5.1)
Alkaline phosphatase (APISO): 113 U/L (ref 31–125)
BUN: 8 mg/dL (ref 7–25)
CO2: 26 mmol/L (ref 20–32)
Calcium: 10 mg/dL (ref 8.6–10.2)
Chloride: 103 mmol/L (ref 98–110)
Creat: 0.73 mg/dL (ref 0.50–0.96)
Globulin: 2.9 g/dL (ref 1.9–3.7)
Glucose, Bld: 83 mg/dL (ref 65–99)
Potassium: 4.2 mmol/L (ref 3.5–5.3)
Sodium: 138 mmol/L (ref 135–146)
Total Bilirubin: 0.4 mg/dL (ref 0.2–1.2)
Total Protein: 7.7 g/dL (ref 6.1–8.1)
eGFR: 115 mL/min/{1.73_m2} (ref >=60)

## 2023-06-29 LAB — CBC WITH DIFFERENTIAL/PLATELET
Absolute Lymphocytes: 2088 {cells}/uL (ref 850–3900)
Absolute Monocytes: 657 {cells}/uL (ref 200–950)
Basophils Absolute: 51 {cells}/uL (ref 0–200)
Basophils Relative: 0.7 %
Eosinophils Absolute: 183 {cells}/uL (ref 15–500)
Eosinophils Relative: 2.5 %
HCT: 44 % (ref 35.0–45.0)
Hemoglobin: 14.1 g/dL (ref 11.7–15.5)
MCH: 27.4 pg (ref 27.0–33.0)
MCHC: 32 g/dL (ref 32.0–36.0)
MCV: 85.4 fL (ref 80.0–100.0)
MPV: 9.8 fL (ref 7.5–12.5)
Monocytes Relative: 9 %
Neutro Abs: 4322 {cells}/uL (ref 1500–7800)
Neutrophils Relative %: 59.2 %
Platelets: 434 10*3/uL — ABNORMAL HIGH (ref 140–400)
RBC: 5.15 10*6/uL — ABNORMAL HIGH (ref 3.80–5.10)
RDW: 13.5 % (ref 11.0–15.0)
Total Lymphocyte: 28.6 %
WBC: 7.3 10*3/uL (ref 3.8–10.8)

## 2023-06-29 IMAGING — CR DG CHEST 2V
2 series · 2 of 2 positions shown · non-contrast
Comparison: 04/18/2019

CLINICAL DATA: Shortness of breath. Ankylosing spondylitis.
Intermittent chest pain.

EXAM:
CHEST - 2 VIEW

[chest pa]
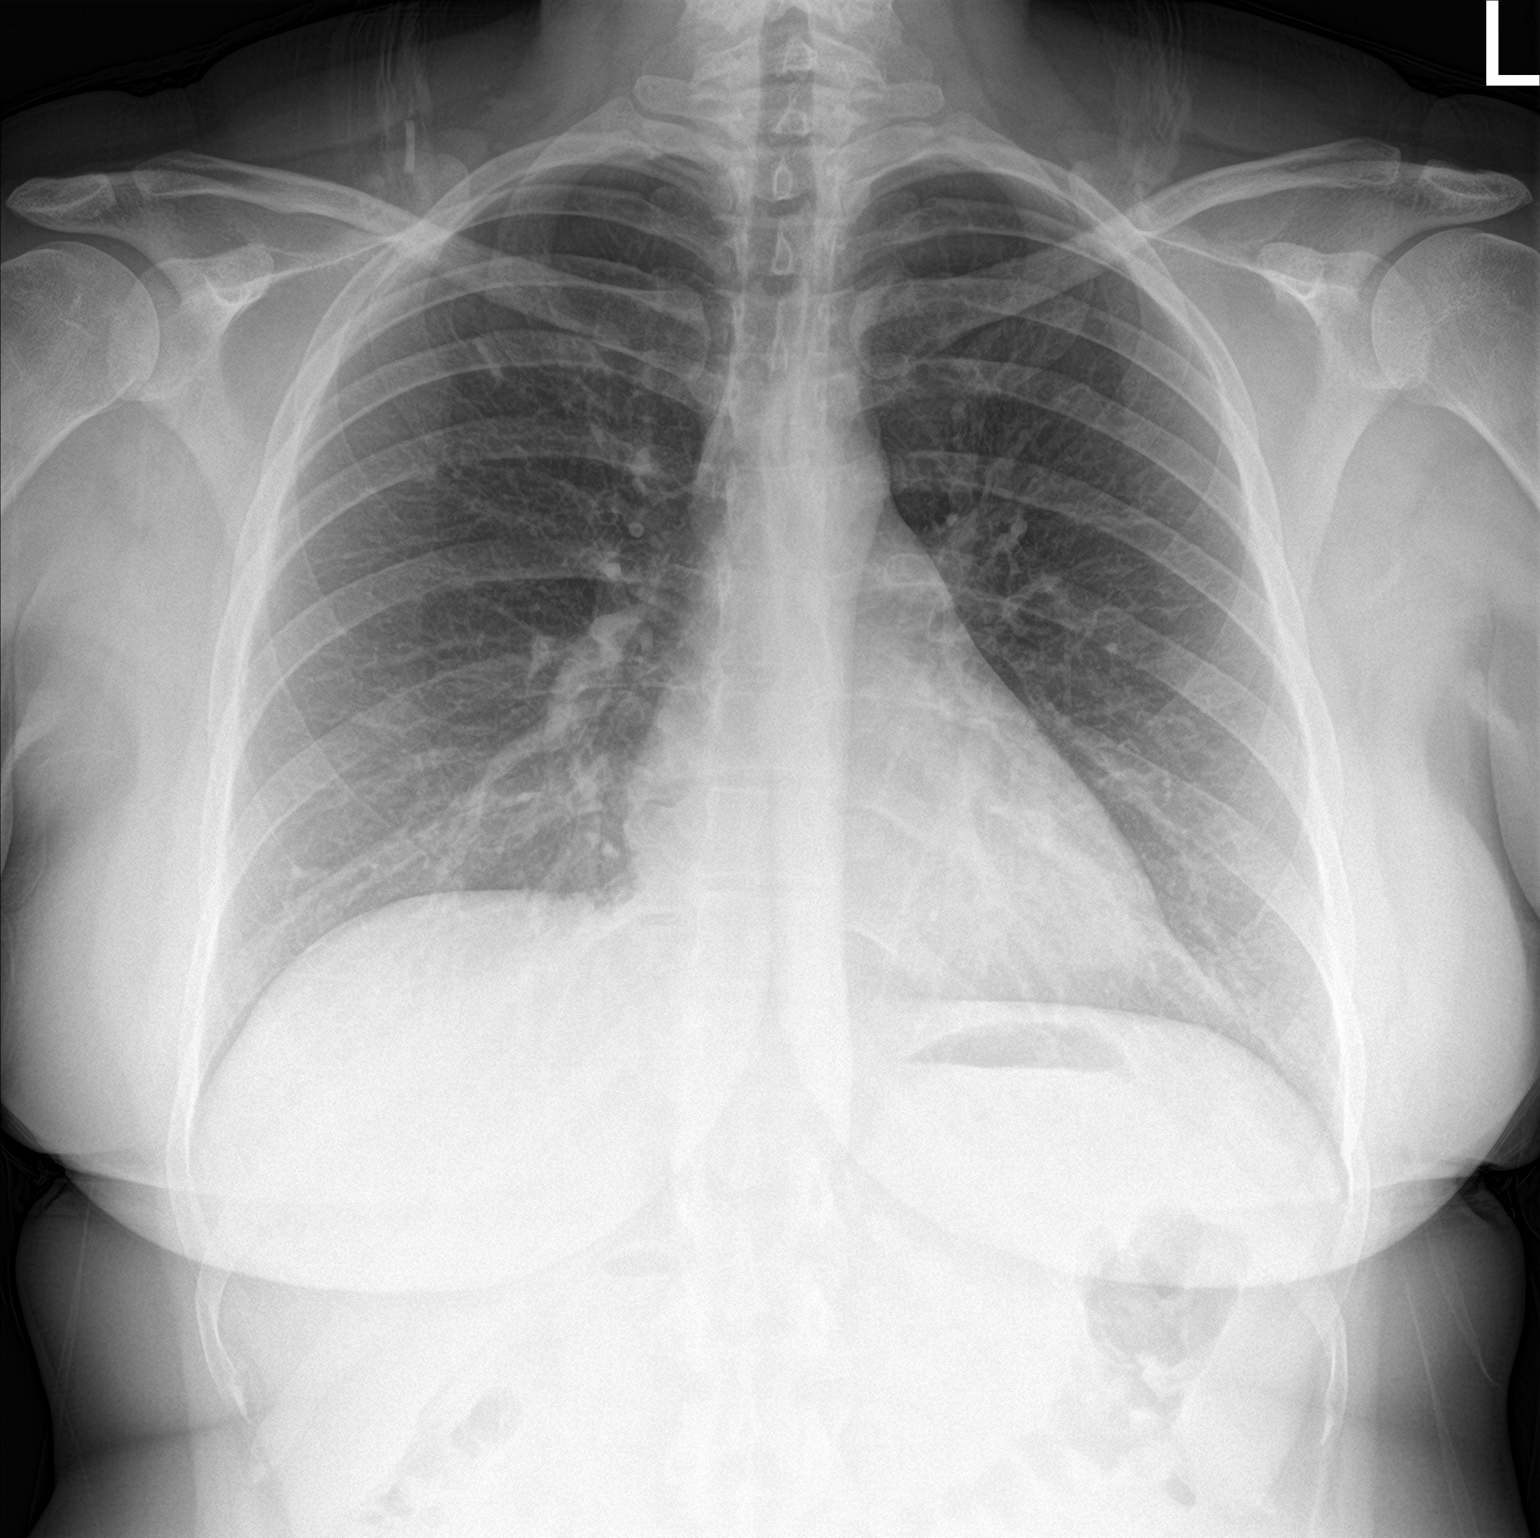

[chest lat]
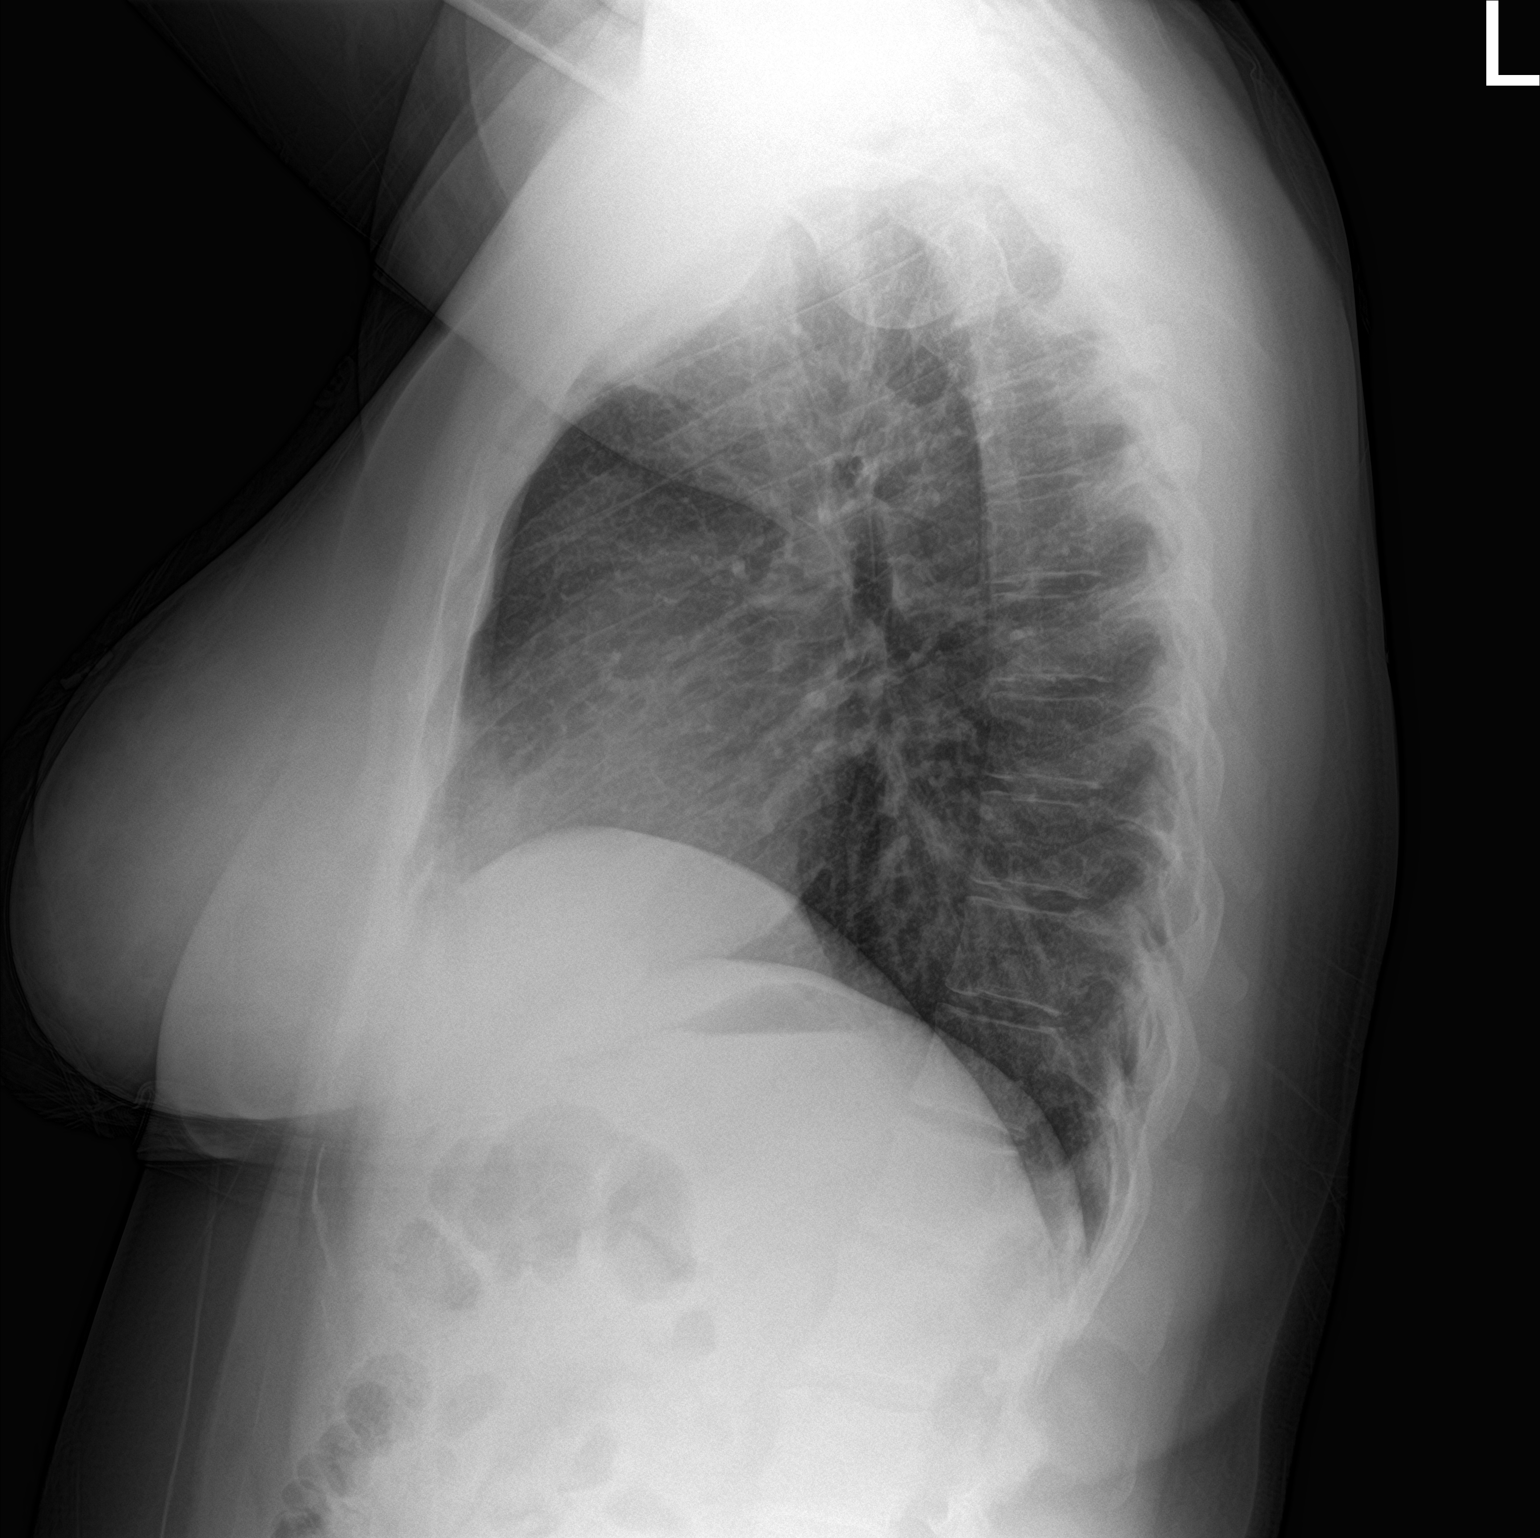

[2 of 2 positions shown; findings below may reference images not displayed]

FINDINGS: The heart size and mediastinal contours are within normal limits.
Both lungs are clear. The visualized skeletal structures are
unremarkable.
IMPRESSION: No active cardiopulmonary disease.

## 2023-06-29 NOTE — Progress Notes (Signed)
 CBC and CMP are normal.

## 2023-07-06 ENCOUNTER — Encounter: Payer: Self-pay | Admitting: Internal Medicine

## 2023-07-06 NOTE — Telephone Encounter (Signed)
Received fax from [facility] pharmacy stating patient's [employer] insurance status is approved for Cimzia but another plan through [employer] requires approval. Form completed and fax with clinicals  Fax: [phone] Phone: [phone]  Chesley Mires, PharmD, MPH, BCPS, CPP Clinical Pharmacist (Rheumatology and Pulmonology)

## 2023-07-07 ENCOUNTER — Telehealth: Payer: Self-pay

## 2023-07-07 DIAGNOSIS — H209 Unspecified iridocyclitis: Secondary | ICD-10-CM | POA: Diagnosis not present

## 2023-07-07 DIAGNOSIS — Q141 Congenital malformation of retina: Secondary | ICD-10-CM | POA: Diagnosis not present

## 2023-07-07 DIAGNOSIS — H3581 Retinal edema: Secondary | ICD-10-CM | POA: Diagnosis not present

## 2023-07-07 NOTE — Telephone Encounter (Signed)
Patient has new insurance. I have faxed a copy of the insurance cards to the team.  Please start a prior authorization on Methotrexate.

## 2023-07-10 ENCOUNTER — Telehealth: Payer: Self-pay | Admitting: Rheumatology

## 2023-07-10 ENCOUNTER — Telehealth: Payer: Self-pay | Admitting: *Deleted

## 2023-07-10 NOTE — Telephone Encounter (Signed)
Medication Samples have been provided to the patient on 07/07/2023  Drug name: Cimzia       Strength: 200 mg/mL , 2 syringes       Qty: 1   LOT: [id number]  Exp.Date: 09/2023  Dosing instructions: Inject one pen into skin once every 4 weeks.

## 2023-07-10 NOTE — Telephone Encounter (Signed)
Lauri from specialty pharmacy called on behalf of the pt. Would like to know if we received a prior authorization for pts cimzia. Call back is [phone]

## 2023-07-11 ENCOUNTER — Other Ambulatory Visit (HOSPITAL_COMMUNITY): Payer: Self-pay

## 2023-07-12 ENCOUNTER — Telehealth: Payer: Self-pay | Admitting: Pharmacy Technician

## 2023-07-12 ENCOUNTER — Other Ambulatory Visit (HOSPITAL_BASED_OUTPATIENT_CLINIC_OR_DEPARTMENT_OTHER): Payer: Self-pay

## 2023-07-12 ENCOUNTER — Other Ambulatory Visit (HOSPITAL_COMMUNITY): Payer: Self-pay

## 2023-07-12 DIAGNOSIS — Z1331 Encounter for screening for depression: Secondary | ICD-10-CM | POA: Diagnosis not present

## 2023-07-12 DIAGNOSIS — Z01419 Encounter for gynecological examination (general) (routine) without abnormal findings: Secondary | ICD-10-CM | POA: Diagnosis not present

## 2023-07-12 MED ORDER — METHOTREXATE SODIUM CHEMO INJECTION 50 MG/2ML
15.0000 mg | INTRAMUSCULAR | 3 refills | Status: DC
  Filled 2023-07-12: qty 2, 21d supply, fill #0
  Filled 2023-07-12: qty 4, 30d supply, fill #0

## 2023-07-12 NOTE — Telephone Encounter (Signed)
Pharmacy Patient Advocate Encounter  Insurance verification completed.   The patient is insured through [employer] test claim for METHOTREXATE 50MG . Currently a quantity of is a 30 day supply and the co-pay is $15.26 . The current 30 day co-pay is, $15.26.  No PA needed at this time.  This test claim was processed through [facility]- copay amounts may vary at other pharmacies due to pharmacy/plan contracts, or as the patient moves through the different stages of their insurance plan.

## 2023-07-12 NOTE — Telephone Encounter (Signed)
Noted see alternate note for further communication

## 2023-07-12 NOTE — Telephone Encounter (Signed)
Received fax from [employer]

## 2023-07-12 NOTE — Telephone Encounter (Signed)
This is being worked on - will follow-up with pharmacy prn

## 2023-07-12 NOTE — Telephone Encounter (Signed)
Received fax from [employer] requesting Crohn's disease activity score. Spoke with rep who stated to submit what we had and notate that we do not manage Crohn's  I've attached GI, ophthalmology, and rheum notes with recent labs  Fax: [phone] Case # [id number]  Chesley Mires, PharmD, MPH, BCPS, CPP Clinical Pharmacist (Rheumatology and Pulmonology)

## 2023-07-12 NOTE — Telephone Encounter (Signed)
The pt has been advised and prescription sent as ordered Pharmacy asked to provide needles and syringes.

## 2023-07-12 NOTE — Telephone Encounter (Signed)
PLEASE SEE ENCOUNTER 1.22.25

## 2023-07-18 MED ORDER — CIMZIA (2 SYRINGE) 200 MG/ML ~~LOC~~ PSKT: PREFILLED_SYRINGE | SUBCUTANEOUS | 0 refills | Status: DC

## 2023-07-18 NOTE — Addendum Note (Signed)
Addended by: Murrell Redden on: 07/18/2023 08:44 AM   Modules accepted: Orders

## 2023-07-18 NOTE — Telephone Encounter (Signed)
Received notification from [employer] regarding a prior authorization for [facility]. Authorization has been APPROVED from 07/17/2023 to 01/14/2024. Approval letter sent to scan center.  Patient must fill through [facility]: [phone]  Authorization # [id number]  Spoke with patient - advised her that rx has now been sent to [facility]. She'll follow-up with pharmacy. MyChart message sent with pharmacy phone number.  Chesley Mires, PharmD, MPH, BCPS, CPP Clinical Pharmacist (Rheumatology and Pulmonology)

## 2023-07-19 DIAGNOSIS — M25542 Pain in joints of left hand: Secondary | ICD-10-CM | POA: Diagnosis not present

## 2023-07-19 DIAGNOSIS — M25642 Stiffness of left hand, not elsewhere classified: Secondary | ICD-10-CM | POA: Diagnosis not present

## 2023-07-26 ENCOUNTER — Other Ambulatory Visit (INDEPENDENT_AMBULATORY_CARE_PROVIDER_SITE_OTHER): Payer: BC Managed Care – PPO

## 2023-07-26 ENCOUNTER — Other Ambulatory Visit: Payer: Self-pay

## 2023-07-26 ENCOUNTER — Ambulatory Visit: Payer: BC Managed Care – PPO | Admitting: Internal Medicine

## 2023-07-26 ENCOUNTER — Other Ambulatory Visit (HOSPITAL_BASED_OUTPATIENT_CLINIC_OR_DEPARTMENT_OTHER): Payer: Self-pay

## 2023-07-26 ENCOUNTER — Encounter: Payer: Self-pay | Admitting: Internal Medicine

## 2023-07-26 VITALS — BP 98/68 | HR 74 | Ht 68.0 in | Wt 241.4 lb

## 2023-07-26 DIAGNOSIS — K50019 Crohn's disease of small intestine with unspecified complications: Secondary | ICD-10-CM

## 2023-07-26 DIAGNOSIS — E559 Vitamin D deficiency, unspecified: Secondary | ICD-10-CM

## 2023-07-26 DIAGNOSIS — H5712 Ocular pain, left eye: Secondary | ICD-10-CM | POA: Diagnosis not present

## 2023-07-26 DIAGNOSIS — R197 Diarrhea, unspecified: Secondary | ICD-10-CM

## 2023-07-26 DIAGNOSIS — Z23 Encounter for immunization: Secondary | ICD-10-CM

## 2023-07-26 DIAGNOSIS — M25542 Pain in joints of left hand: Secondary | ICD-10-CM | POA: Diagnosis not present

## 2023-07-26 DIAGNOSIS — M25642 Stiffness of left hand, not elsewhere classified: Secondary | ICD-10-CM | POA: Diagnosis not present

## 2023-07-26 LAB — COMPREHENSIVE METABOLIC PANEL
ALT: 22 U/L (ref 0–35)
AST: 16 U/L (ref 0–37)
Albumin: 4.4 g/dL (ref 3.5–5.2)
Alkaline Phosphatase: 106 U/L (ref 39–117)
BUN: 9 mg/dL (ref 6–23)
CO2: 24 meq/L (ref 19–32)
Calcium: 9.2 mg/dL (ref 8.4–10.5)
Chloride: 105 meq/L (ref 96–112)
Creatinine, Ser: 0.64 mg/dL (ref 0.40–1.20)
GFR: 120.45 mL/min (ref >60.00)
Glucose, Bld: 83 mg/dL (ref 70–99)
Potassium: 4 meq/L (ref 3.5–5.1)
Sodium: 139 meq/L (ref 135–145)
Total Bilirubin: 0.5 mg/dL (ref 0.2–1.2)
Total Protein: 7.7 g/dL (ref 6.0–8.3)

## 2023-07-26 LAB — CBC WITH DIFFERENTIAL/PLATELET
Basophils Absolute: 0 10*3/uL (ref 0.0–0.1)
Basophils Relative: 0.4 % (ref 0.0–3.0)
Eosinophils Absolute: 0.2 10*3/uL (ref 0.0–0.7)
Eosinophils Relative: 2.5 % (ref 0.0–5.0)
HCT: 42.7 % (ref 36.0–46.0)
Hemoglobin: 14.4 g/dL (ref 12.0–15.0)
Lymphocytes Relative: 28.3 % (ref 12.0–46.0)
Lymphs Abs: 1.8 10*3/uL (ref 0.7–4.0)
MCHC: 33.7 g/dL (ref 30.0–36.0)
MCV: 85.2 fL (ref 78.0–100.0)
Monocytes Absolute: 0.4 10*3/uL (ref 0.1–1.0)
Monocytes Relative: 7.2 % (ref 3.0–12.0)
Neutro Abs: 3.8 10*3/uL (ref 1.4–7.7)
Neutrophils Relative %: 61.6 % (ref 43.0–77.0)
Platelets: 400 10*3/uL (ref 150.0–400.0)
RBC: 5.01 Mil/uL (ref 3.87–5.11)
RDW: 14.6 % (ref 11.5–15.5)
WBC: 6.2 10*3/uL (ref 4.0–10.5)

## 2023-07-26 LAB — VITAMIN B12: Vitamin B-12: 423 pg/mL (ref 211–911)

## 2023-07-26 LAB — VITAMIN D 25 HYDROXY (VIT D DEFICIENCY, FRACTURES): VITD: 14.18 ng/mL — ABNORMAL LOW (ref 30.00–100.00)

## 2023-07-26 LAB — C-REACTIVE PROTEIN: CRP: 1.2 mg/dL (ref 0.5–20.0)

## 2023-07-26 MED ORDER — METHOTREXATE SODIUM CHEMO INJECTION 50 MG/2ML
15.0000 mg | INTRAMUSCULAR | 3 refills | Status: DC
  Filled 2023-07-26: qty 4, 42d supply, fill #0
  Filled 2023-08-20: qty 4, 28d supply, fill #0

## 2023-07-26 NOTE — Patient Instructions (Addendum)
 Your provider has requested that you go to the basement level for lab work before leaving today. Press B on the elevator. The lab is located at the first door on the left as you exit the elevator.  We have sent the following medications to your pharmacy for you to pick up at your convenience: Methotrexate    Your provider has ordered Diatherix stool testing for you. You have received a kit from our office today containing all necessary supplies to complete this test. Please carefully read the stool collection instructions provided in the kit before opening the accompanying materials. In addition, be sure there is a label providing your full name and date of birth on the puritan opti-swab tube that is supplied in the kit (if you do not see a label with this information on your test tube, please make us  aware!). After completing the test, you should secure the purtian tube into the specimen biohazard bag. The laboratory request information sheet (including date and time of specimen collection) should be placed into the outside pocket of the specimen biohazard bag and returned to the [place] lab (basement floor of [facility]) with 2 days of collection.    If the laboratory information sheet specimen date and time are not filled out, the test will NOT be performed.  If your blood pressure at your visit was 140/90 or greater, please contact your primary care physician to follow up on this.  _______________________________________________________  If you are age 94 or older, your body mass index should be between 23-30. Your Body mass index is 36.7 kg/m. If this is out of the aforementioned range listed, please consider follow up with your Primary Care Provider.  If you are age 50 or younger, your body mass index should be between 19-25. Your Body mass index is 36.7 kg/m. If this is out of the aformentioned range listed, please consider follow up with your Primary Care Provider.    ________________________________________________________  The [place] [facility] providers would like to encourage you to use MYCHART to communicate with providers for non-urgent requests or questions.  Due to long hold times on the telephone, sending your provider a message by [facility] may be a faster and more efficient way to get a response.  Please allow 48 business hours for a response.  Please remember that this is for non-urgent requests.  _______________________________________________________  Thank you for entrusting me with your care and for choosing [facility],  Dr. Estefana Kidney

## 2023-07-26 NOTE — Progress Notes (Signed)
 Chief Complaint: Crohn's disease  HPI:    Toni Livingston is a 28 year old female with history of ileal Crohn's disease and ankylosing spondylitis presents for follow up of Crohn's disease  Interval History: Over the last 2 weeks she has been having more diarrhea. Endorses 1-2 BMs per day. Stools are looser than normal. She is good about taking Cimzia  and methotrexate . Last dose of Cimzia  was 1/15. She saw her eye doctor 3 weeks ago and everything was fine at that time. Her visual acuity is good. However more recently her left eye has been hurting. Her right eye is fine. She has been using steroid eye drops without good effects. She is planning to see her eye doctor soon about this left eye pain. Denies abdominal pain or blood in the stools. She is eating and drinking okay. She has gained some weight recently. 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 Hemorrhoids have been okay. She has been taking her folic acid  supplements. She did get sick last week with runny nose and sore throat. Denies fevers. Joint pains and skin have been okay.   Wt Readings from Last 3 Encounters:  07/26/23 241 lb 6 oz (109.5 kg)  06/28/23 237 lb (107.5 kg)  05/31/23 236 lb 9.6 oz (107.3 kg)    IBD Characteristics: Type: Crohn's disease Phenotype: Inflammatory, stricturing Perianal involvement: No Location: Ileum (seen on colonoscopy and CT enterography in 2023) Diagnosed: 2023 Extraintestinal manifestations: Iritis, ankylosing spondylitis Previous therapies: Humira  (failed due to development of antibodies and loss of drug level) Current therapy: Cimzia  (started in 09/2021)   Current Outpatient Medications  Medication Sig Dispense Refill   certolizumab pegol  (CIMZIA , 2 SYRINGE,) 200 MG/ML prefilled syringe INJECT 2 SYRINGES UNDER THE SKIN EVERY 4 WEEKS **CONTINUATION OF TREATMENT** 6 mL 0   cyanocobalamin  (VITAMIN B12) 1000 MCG/ML injection 1 ml subcut every x 4 wks then 1 ml subcut monthly and dispense  with syringes 4 mL 11   folic acid  (FOLVITE ) 1 MG tablet      methotrexate  50 MG/2ML injection Inject 0.6 mLs (15 mg total) into the skin every 7 (seven) days. *DISCARD UNUSED REMAINDER 30 DAYS AFTER INITIAL USE 4 mL 3   prednisoLONE acetate (PRED FORTE) 1 % ophthalmic suspension as needed.     sertraline (ZOLOFT) 25 MG tablet Take 25 mg by mouth daily.     triamcinolone  cream (KENALOG ) 0.1 % Apply 1 application. topically 2 (two) times daily as needed. 80 g 3   No current facility-administered medications for this visit.     Physical Exam:  Vital signs: BP 98/68   Pulse 74   Ht 5' 8 (1.727 m)   Wt 241 lb 6 oz (109.5 kg)   LMP 07/24/2023 (Exact Date)   SpO2 97%   BMI 36.70 kg/m   Constitutional:   Pleasant Caucasian female appears to be in NAD, Well developed, Well nourished, alert and cooperative Respiratory: Respirations even and unlabored. Lungs clear to auscultation bilaterally.   No wheezes, crackles, or rhonchi.  Cardiovascular: Normal S1, S2. No MRG. Regular rate Gastrointestinal:  Soft, nontender, nondistended Psychiatric:  Demonstrates good judgement and reason without abnormal affect or behaviors.  RELEVANT LABS AND IMAGING: CBC    Component Value Date/Time   WBC 7.3 06/28/2023 1541   RBC 5.15 (H) 06/28/2023 1541   HGB 14.1 06/28/2023 1541   HCT 44.0 06/28/2023 1541   PLT 434 (H) 06/28/2023 1541   MCV 85.4 06/28/2023 1541   MCH 27.4 06/28/2023 1541  MCHC 32.0 06/28/2023 1541   RDW 13.5 06/28/2023 1541   LYMPHSABS 2.3 04/07/2023 1507   MONOABS 0.6 04/07/2023 1507   EOSABS 183 06/28/2023 1541   BASOSABS 51 06/28/2023 1541    CMP     Component Value Date/Time   NA 138 06/28/2023 1541   K 4.2 06/28/2023 1541   CL 103 06/28/2023 1541   CO2 26 06/28/2023 1541   GLUCOSE 83 06/28/2023 1541   BUN 8 06/28/2023 1541   CREATININE 0.73 06/28/2023 1541   CALCIUM  10.0 06/28/2023 1541   PROT 7.7 06/28/2023 1541   ALBUMIN 4.6 04/07/2023 1507   AST 18 06/28/2023  1541   ALT 19 06/28/2023 1541   ALKPHOS 92 04/07/2023 1507   BILITOT 0.4 06/28/2023 1541   GFRNONAA >60 11/07/2022 0258   GFRNONAA 122 10/06/2020 1132   GFRAA 141 10/06/2020 1132   Labs 08/2021: Humira  level <0.6. Humira  antibody level of 3820.   Labs 11/2021: CBC and CMP unremarkable.  Labs 02/2022: CBC and CMP unremarkable. Fecal calprotectin 100 mg (borderline). Quant gold negative. ESR mildly elevated at 21.   Labs 05/2022: CBC and CMP unremarkable  Labs 08/2022: [facility] GI pathogen panel. Stool test was positive for enteropathogenic E coli (EPEC). C dif was negative. Fecal calprotectin was elevated at 197. Cimzia  level at 39 and antibody level at 246. ESR elevated at 36. CRP elevated at 18.51. CBC nml.   Labs 10/2022: CBC with elevated WBC of 16.9 and low Hb of 11.6. CMP with low Na of 131, low K of 3.2, elevated alk phos of 144, low albumin of 3.2.   Labs 12/2022: CBC normal.  CMP with a mildly low potassium level of 3.4.  Cimzia  drug level was 43 and antibody levels 511.  CRP is elevated at 14.8.  ESR is elevated at 31.  Vitamin B12 was low at 202.  Vitamin D , iron, and folate were normal.  TPMT enzyme activity was normal.  Fecal calprotectin normal.  Labs 01/2023: CBC normal.  CMP normal.  QuantiFERON gold negative.  Labs 03/2023: CRP normal.  Cimzia  level 21.  Cimzia  antibody level elevated at 1657.  CBC and CMP normal.  Labs 05/2023: Quant gold negative  CT enterography 09/06/21: IMPRESSION: 1. There is short segment circumferential wall thickening and mucosal hyperenhancement of the terminal ileum, involving a segment approximately 4 cm in length from the ileocecal valve. This appearance is in keeping with Crohn's ileitis. No evidence of complicating stricture, fistula, or abscess at this time. No other evidence of bowel inflammation. 2. IUD is present in the uterus, although in an abnormal appearing low lying position, within the lower uterine segment and or endocervical canal.  Consider pelvic ultrasound to assess appropriate positioning. 3. No osseous stigmata of ankylosing spondylitis by CT.  Colonoscopy 08/16/21: - Inflammed and strictured terminal ileum, otherwise the examination was normal. - Biopsies taken from terminal ileum, right and left colon. - The examination was otherwise normal on direct and retroflexion views. Path: 1. Surgical [P], small bowel, terminal ileum - SEVERELY ACTIVE CHRONIC, NONSPECIFIC ILEITIS - NO GRANULOMAS, DYSPLASIA OR MALIGNANCY IDENTIFIED - SEE COMMENT 2. Surgical [P], right colon biopsy - BENIGN COLONIC MUCOSA - NO ACTIVE INFLAMMATION OR EVIDENCE OF MICROSCOPIC COLITIS - NO HIGH-GRADE DYSPLASIA OR MALIGNANCY IDENTIFIED 3. Surgical [P], left colon biopsy - BENIGN COLONIC MUCOSA - NO ACTIVE INFLAMMATION OR EVIDENCE OF MICROSCOPIC COLITIS - NO HIGH-GRADE DYSPLASIA OR MALIGNANCY IDENTIFIED Microscopic Comment 1. The biopsy is scant and not typical of inflammatory bowel disease.  The differential diagnosis would include drugs, infection and inflammatory bowel disease. Radiologic correlation and patient follow-up is suggested. Dr. Rebbecca reviewed the case and agrees with the above diagnosis.  Assessment: Small bowel Crohn's disease Loose stools Hemorrhoids Patient presents for follow up of ileal Crohn's disease.  Patient has noted some more loose stools recently plan to evaluate for any signs of infection or active Crohn's inflammation by performing labs as well as a fecal calprotectin and GI pathogen panel.  She has been compliant with her Cimzia  and methotrexate  as well as her folic acid  supplements.  Hopefully with the use of her methotrexate , this will decrease her Cimzia  antibodies over time.  Patient has noted some left eye pain recently so she will see an eye doctor about this soon.  Plan: - Plan to check labs with CBC, CMP, CRP, Cimzia  level and antibody, vitamin B12, vitamin D  - Next QuantiFERON gold is due in 05/2024 -  Check fecal calprotectin - Check [facility] GI pathogen panel - Continue Cimzia  - Cont methotrexate  15 mcg SQ weekly. Send a refill to the [facility]. - Cont folic acid  1 mg every day - Cont vitamin B12 supplements - Patient will see eye doctor soon about left eye pain - Will give hepatitis B vaccination today - Patient is established with a dermatologist and gets regular skin checks - RTC in 3 months  I spent 40 minutes of time, including in depth chart review, independent review of results as outlined above, communicating results with the patient directly, face-to-face time with the patient, coordinating care, ordering studies and medications as appropriate, and documentation.

## 2023-07-27 ENCOUNTER — Other Ambulatory Visit (HOSPITAL_BASED_OUTPATIENT_CLINIC_OR_DEPARTMENT_OTHER): Payer: Self-pay

## 2023-07-27 ENCOUNTER — Encounter: Payer: Self-pay | Admitting: Internal Medicine

## 2023-07-27 MED ORDER — VITAMIN D (ERGOCALCIFEROL) 1.25 MG (50000 UNIT) PO CAPS
50000.0000 [IU] | ORAL_CAPSULE | ORAL | 3 refills | Status: DC
  Filled 2023-07-27: qty 4, 28d supply, fill #0
  Filled 2023-08-20: qty 4, 28d supply, fill #1
  Filled 2023-11-16: qty 4, 28d supply, fill #2
  Filled 2023-12-24: qty 4, 28d supply, fill #3

## 2023-07-27 NOTE — Addendum Note (Signed)
 Addended by: Daina Drum on: 07/27/2023 05:52 PM   Modules accepted: Orders

## 2023-07-31 ENCOUNTER — Telehealth: Payer: Self-pay | Admitting: *Deleted

## 2023-07-31 ENCOUNTER — Telehealth: Payer: Self-pay

## 2023-07-31 NOTE — Telephone Encounter (Signed)
 Patient contacted the office and left a message [facility] advised her they can not send out her medication because the PA and the prescription sent in do not match. Please check on this and reach out to patient. Patient is due for her next injection is due this Wednesday. Patient did request a sample of Cimzia . Reached out to patient and advised she may come pick up a sample.

## 2023-07-31 NOTE — Telephone Encounter (Signed)
 Ariel from [employer] contacted the office to inquire if we had received the patient assistance form for the patient. Advised Ariel I would send a message to the pharmacy team about it. Ariel states they will re-fax the patient assistance form to the office. Ariel's call back number is [phone]. Please advise.

## 2023-08-01 ENCOUNTER — Other Ambulatory Visit: Payer: BC Managed Care – PPO

## 2023-08-01 ENCOUNTER — Telehealth: Payer: Self-pay | Admitting: *Deleted

## 2023-08-01 DIAGNOSIS — K50019 Crohn's disease of small intestine with unspecified complications: Secondary | ICD-10-CM | POA: Diagnosis not present

## 2023-08-01 DIAGNOSIS — M25642 Stiffness of left hand, not elsewhere classified: Secondary | ICD-10-CM | POA: Diagnosis not present

## 2023-08-01 DIAGNOSIS — M25542 Pain in joints of left hand: Secondary | ICD-10-CM | POA: Diagnosis not present

## 2023-08-01 NOTE — Telephone Encounter (Signed)
Medication Samples have been provided to the patient.  Drug name: Cimzia Strength: 200 mg/ml Qty: 1 LOT: [id number] Exp.Date: 02/2024  Dosing instructions: Inject 2 syringes under the skin every 4 weeks

## 2023-08-02 ENCOUNTER — Other Ambulatory Visit (HOSPITAL_COMMUNITY): Payer: Self-pay

## 2023-08-02 NOTE — Telephone Encounter (Signed)
We have received patient assistance application for Cimzia

## 2023-08-02 NOTE — Telephone Encounter (Addendum)
Called [facility] to determine status of Cimzia PFS rx. Per rep rx is rejecting for NDC. They are processing correct NDC.  Provided rep with savings card information.  I called [facility] who states the correct NDC is approved but pharmacy is running claim incorrectly in some way. Insurance rep called pharmacy - pharmacy has escalated case and they will call [facility] with update  Chesley Mires, PharmD, MPH, BCPS, CPP Clinical Pharmacist (Rheumatology and Pulmonology)

## 2023-08-03 ENCOUNTER — Other Ambulatory Visit: Payer: Self-pay

## 2023-08-03 ENCOUNTER — Encounter: Payer: Self-pay | Admitting: Internal Medicine

## 2023-08-03 ENCOUNTER — Telehealth: Payer: Self-pay

## 2023-08-03 DIAGNOSIS — Z79899 Other long term (current) drug therapy: Secondary | ICD-10-CM

## 2023-08-03 DIAGNOSIS — K50118 Crohn's disease of large intestine with other complication: Secondary | ICD-10-CM

## 2023-08-03 DIAGNOSIS — H209 Unspecified iridocyclitis: Secondary | ICD-10-CM

## 2023-08-03 DIAGNOSIS — M458 Ankylosing spondylitis sacral and sacrococcygeal region: Secondary | ICD-10-CM

## 2023-08-03 LAB — CALPROTECTIN, FECAL: Calprotectin, Fecal: 317 ug/g — ABNORMAL HIGH (ref 0–120)

## 2023-08-03 MED ORDER — CIMZIA (2 SYRINGE) 200 MG/ML ~~LOC~~ PSKT: PREFILLED_SYRINGE | SUBCUTANEOUS | Status: DC

## 2023-08-03 NOTE — Telephone Encounter (Signed)
Please initiate a prior authorization for Cimzi 200mg /ml prefilled syringe every 3 weeks. Diagnosis is Crohn's disease of small intestine with complication ICD-10 K50.019 Asap.

## 2023-08-03 NOTE — Progress Notes (Signed)
Spoke to patient advise per provider [facility] C dif stool test was negative patient verbalized understanding.

## 2023-08-03 NOTE — Progress Notes (Signed)
Hi Dr. Leonides Schanz, I appreciate your note.  I would not be able to give her higher dose of Cimzia for ankylosing spondylitis but you may be able prescribe a higher dose of Cimzia for Crohn's disease.  Would you kindly take over the prescription of Cimzia and increase the dose as needed for Crohn's. Thank you

## 2023-08-03 NOTE — Progress Notes (Signed)
[facility] C dif stool test (collected 08/01/23) was negative.   Tisha, please let the patient know that her stool test was negative for C dif. Thanks.

## 2023-08-04 LAB — SERIAL MONITORING

## 2023-08-05 LAB — CERTOLIZUMAB AND ANTI-CERTO AB
Anti-Certolizumab Ab Level: 569 ng/mL
Certolizumab DRUG Level: 20 ug/mL

## 2023-08-07 ENCOUNTER — Encounter: Payer: Self-pay | Admitting: Internal Medicine

## 2023-08-07 NOTE — Telephone Encounter (Signed)
Appears that GI is now changing Cimzia frequency to every 3 weeks. Will require new auth. Appears that GI will manage Auth for this moving forward  Chesley Mires, PharmD, MPH, BCPS, CPP Clinical Pharmacist (Rheumatology and Pulmonology)

## 2023-08-08 NOTE — Telephone Encounter (Signed)
Have you received any follow up on the increase of Cimzia 400 mg every 3 weeks (as per the medication list)

## 2023-08-10 ENCOUNTER — Telehealth: Payer: Self-pay | Admitting: *Deleted

## 2023-08-10 NOTE — Telephone Encounter (Signed)
Ariel from [employer] contacted the office to inquire if we sent out the patient assistance form for the patient yet. Advised Ariel is appears the patient's GI doctor is now the one prescribing the patient's Cimzia and will be over the Authorization and Patient Assistance. Advised Ariel of the phone number for GI. Ariel states she will contact them about the authorization and patient assistance.

## 2023-08-10 NOTE — Telephone Encounter (Signed)
Received a call from [facility]. They called regarding the PA for Cimzia.   Returned call and advised the GI office will be taking over medication and they will need to contact their office regarding the PA.

## 2023-08-11 ENCOUNTER — Other Ambulatory Visit (HOSPITAL_COMMUNITY): Payer: Self-pay

## 2023-08-15 ENCOUNTER — Encounter: Payer: Self-pay | Admitting: Internal Medicine

## 2023-08-15 ENCOUNTER — Other Ambulatory Visit: Payer: Self-pay | Admitting: Internal Medicine

## 2023-08-15 ENCOUNTER — Telehealth: Payer: Self-pay | Admitting: Internal Medicine

## 2023-08-15 DIAGNOSIS — K50118 Crohn's disease of large intestine with other complication: Secondary | ICD-10-CM

## 2023-08-15 DIAGNOSIS — H209 Unspecified iridocyclitis: Secondary | ICD-10-CM

## 2023-08-15 DIAGNOSIS — M458 Ankylosing spondylitis sacral and sacrococcygeal region: Secondary | ICD-10-CM

## 2023-08-15 DIAGNOSIS — Z79899 Other long term (current) drug therapy: Secondary | ICD-10-CM

## 2023-08-15 MED ORDER — CIMZIA (2 SYRINGE) 200 MG/ML ~~LOC~~ PSKT: PREFILLED_SYRINGE | SUBCUTANEOUS | 3 refills | Status: DC

## 2023-08-15 NOTE — Telephone Encounter (Signed)
 Toni Livingston is calling about the patient assistance papers that she faxed to [place] as well as having the patient's clinical notes faxed to them. Fax [phone]

## 2023-08-15 NOTE — Telephone Encounter (Signed)
 Is anyone following up on this?

## 2023-08-16 ENCOUNTER — Other Ambulatory Visit (HOSPITAL_COMMUNITY): Payer: Self-pay

## 2023-08-16 ENCOUNTER — Telehealth: Payer: Self-pay | Admitting: Pharmacy Technician

## 2023-08-16 DIAGNOSIS — M25542 Pain in joints of left hand: Secondary | ICD-10-CM | POA: Diagnosis not present

## 2023-08-16 DIAGNOSIS — M25642 Stiffness of left hand, not elsewhere classified: Secondary | ICD-10-CM | POA: Diagnosis not present

## 2023-08-16 NOTE — Telephone Encounter (Signed)
 Pharmacy Patient Advocate Encounter   Received notification from Physician's Office that prior authorization for CIMZIA 200MG  is required/requested.   Insurance verification completed.   The patient is insured through [employer] .   Per test claim: PA required; PA submitted to above mentioned insurance via CoverMyMeds Key/confirmation #/EOC [id number] Status is pending

## 2023-08-17 ENCOUNTER — Other Ambulatory Visit (HOSPITAL_COMMUNITY): Payer: Self-pay

## 2023-08-17 NOTE — Telephone Encounter (Signed)
 A prior authorization was submitted for this patient from outside source on 1.3.25.   Pharmacy Patient Advocate Encounter  Received notification from [employer] that Prior Authorization for [facility] has been APPROVED from 1.3.25 to 1.3.26   PA #/Case ID/Reference #: [id number]

## 2023-08-18 ENCOUNTER — Other Ambulatory Visit: Payer: Self-pay

## 2023-08-18 DIAGNOSIS — H209 Unspecified iridocyclitis: Secondary | ICD-10-CM

## 2023-08-18 DIAGNOSIS — Z79899 Other long term (current) drug therapy: Secondary | ICD-10-CM

## 2023-08-18 DIAGNOSIS — K50118 Crohn's disease of large intestine with other complication: Secondary | ICD-10-CM

## 2023-08-18 DIAGNOSIS — M458 Ankylosing spondylitis sacral and sacrococcygeal region: Secondary | ICD-10-CM

## 2023-08-18 MED ORDER — CIMZIA (2 SYRINGE) 200 MG/ML ~~LOC~~ PSKT: PREFILLED_SYRINGE | SUBCUTANEOUS | 6 refills | Status: DC

## 2023-08-18 MED ORDER — CIMZIA (2 SYRINGE) 200 MG/ML ~~LOC~~ PSKT: PREFILLED_SYRINGE | SUBCUTANEOUS | 3 refills | Status: DC

## 2023-08-18 NOTE — Telephone Encounter (Signed)
 Refill sent to the [facility]  Phone: [phone]  Fax: [phone]   Copy of prescription faxed to [employer] Patient assistance [address], [place], [place] [address] Fax [phone] Phone [phone]

## 2023-08-20 ENCOUNTER — Other Ambulatory Visit: Payer: Self-pay | Admitting: Family Medicine

## 2023-08-21 ENCOUNTER — Other Ambulatory Visit (HOSPITAL_BASED_OUTPATIENT_CLINIC_OR_DEPARTMENT_OTHER): Payer: Self-pay

## 2023-08-21 ENCOUNTER — Other Ambulatory Visit: Payer: Self-pay

## 2023-08-21 ENCOUNTER — Encounter: Payer: Self-pay | Admitting: Internal Medicine

## 2023-08-21 DIAGNOSIS — K50118 Crohn's disease of large intestine with other complication: Secondary | ICD-10-CM

## 2023-08-21 DIAGNOSIS — Z79899 Other long term (current) drug therapy: Secondary | ICD-10-CM

## 2023-08-21 DIAGNOSIS — M458 Ankylosing spondylitis sacral and sacrococcygeal region: Secondary | ICD-10-CM

## 2023-08-21 DIAGNOSIS — H209 Unspecified iridocyclitis: Secondary | ICD-10-CM

## 2023-08-21 MED ORDER — CIMZIA (2 SYRINGE) 200 MG/ML ~~LOC~~ PSKT: PREFILLED_SYRINGE | SUBCUTANEOUS | 6 refills | Status: DC

## 2023-08-21 NOTE — Telephone Encounter (Signed)
 PA request has been Approved. New Encounter has been or will be created for follow up. For additional info see Pharmacy Prior Auth telephone encounter from 08/16/23.

## 2023-08-22 DIAGNOSIS — M25642 Stiffness of left hand, not elsewhere classified: Secondary | ICD-10-CM | POA: Diagnosis not present

## 2023-08-22 DIAGNOSIS — M25542 Pain in joints of left hand: Secondary | ICD-10-CM | POA: Diagnosis not present

## 2023-08-23 ENCOUNTER — Ambulatory Visit

## 2023-08-23 ENCOUNTER — Ambulatory Visit: Payer: BC Managed Care – PPO

## 2023-08-24 ENCOUNTER — Ambulatory Visit

## 2023-08-24 ENCOUNTER — Other Ambulatory Visit: Payer: Self-pay

## 2023-08-24 DIAGNOSIS — M458 Ankylosing spondylitis sacral and sacrococcygeal region: Secondary | ICD-10-CM

## 2023-08-24 DIAGNOSIS — Z79899 Other long term (current) drug therapy: Secondary | ICD-10-CM

## 2023-08-24 DIAGNOSIS — Z23 Encounter for immunization: Secondary | ICD-10-CM

## 2023-08-24 DIAGNOSIS — K50118 Crohn's disease of large intestine with other complication: Secondary | ICD-10-CM

## 2023-08-24 DIAGNOSIS — H209 Unspecified iridocyclitis: Secondary | ICD-10-CM

## 2023-08-24 MED ORDER — CIMZIA (2 SYRINGE) 200 MG/ML ~~LOC~~ PSKT: PREFILLED_SYRINGE | SUBCUTANEOUS | 6 refills | Status: DC

## 2023-08-29 DIAGNOSIS — M25542 Pain in joints of left hand: Secondary | ICD-10-CM | POA: Diagnosis not present

## 2023-08-29 DIAGNOSIS — M25642 Stiffness of left hand, not elsewhere classified: Secondary | ICD-10-CM | POA: Diagnosis not present

## 2023-09-05 DIAGNOSIS — M25642 Stiffness of left hand, not elsewhere classified: Secondary | ICD-10-CM | POA: Diagnosis not present

## 2023-09-05 DIAGNOSIS — M25542 Pain in joints of left hand: Secondary | ICD-10-CM | POA: Diagnosis not present

## 2023-09-11 NOTE — Telephone Encounter (Signed)
Please be on the look out.

## 2023-09-12 ENCOUNTER — Telehealth: Payer: Self-pay | Admitting: Internal Medicine

## 2023-09-12 DIAGNOSIS — M25642 Stiffness of left hand, not elsewhere classified: Secondary | ICD-10-CM | POA: Diagnosis not present

## 2023-09-12 DIAGNOSIS — M25542 Pain in joints of left hand: Secondary | ICD-10-CM | POA: Diagnosis not present

## 2023-09-12 NOTE — Telephone Encounter (Signed)
 Rep called for [facility] regarding this patient. Rep stated that they would like to know if we had received a fax for a new prescription for CIMZIA. Rep mentioned that they are looking for quantity of 1 and supply of 28 for the new prescription. A good call back number for them is [phone]. Please advise.

## 2023-09-14 NOTE — Telephone Encounter (Signed)
 [facility] rep called to clarify order and the quantity, she stated if its for a 28 day supply they request a new order and if it's for 21 day supply it would need PA. Tel: [phone]

## 2023-09-14 NOTE — Telephone Encounter (Signed)
 Morrison Old this was routed to me but I have not been involved with the communications with the patient or pharmacy for Dr. Leonides Schanz

## 2023-09-15 ENCOUNTER — Other Ambulatory Visit (HOSPITAL_COMMUNITY): Payer: Self-pay

## 2023-09-15 NOTE — Telephone Encounter (Signed)
 Pt made aware. Pt verbalized understanding with all questions answered.

## 2023-09-15 NOTE — Telephone Encounter (Signed)
 Please see Note Below.  Spoke with representative with [facility] in regard to prior message and Fax that was received stating they are in need of a PA for the 21 day supply. Please assist with PA. I will also fax document that we received in the office from [facility] to the PA department.

## 2023-09-15 NOTE — Telephone Encounter (Signed)
 Encounter on 2.26 has an approved prior authorization. Approval letter attached to patient chart. Effective 06-23-2023 to 06-22-2024. Double checked authorization was sent for 21 day supply.   No PA required at this time.

## 2023-09-15 NOTE — Telephone Encounter (Signed)
 Faxed received from [facility] in regard to pt Cimzia, I spoke with [facility] and spoke to pt. Phone note was sent to PA department to assist with PA. Pt made aware. Pt verbalized understanding with all questions answered.

## 2023-09-19 DIAGNOSIS — M25642 Stiffness of left hand, not elsewhere classified: Secondary | ICD-10-CM | POA: Diagnosis not present

## 2023-09-19 DIAGNOSIS — M25542 Pain in joints of left hand: Secondary | ICD-10-CM | POA: Diagnosis not present

## 2023-09-22 ENCOUNTER — Telehealth: Admitting: Family Medicine

## 2023-09-22 DIAGNOSIS — K529 Noninfective gastroenteritis and colitis, unspecified: Secondary | ICD-10-CM | POA: Diagnosis not present

## 2023-09-22 MED ORDER — ONDANSETRON 4 MG PO TBDP: 4.0000 mg | ORAL_TABLET | Freq: Three times a day (TID) | ORAL | 0 refills | Status: AC | PRN

## 2023-09-22 NOTE — Progress Notes (Signed)
 Virtual Visit Consent   Toni Livingston, you are scheduled for a virtual visit with a [facility] provider today. Just as with appointments in the office, your consent must be obtained to participate. Your consent will be active for this visit and any virtual visit you may have with one of our providers in the next 365 days. If you have a MyChart account, a copy of this consent can be sent to you electronically.  As this is a virtual visit, video technology does not allow for your provider to perform a traditional examination. This may limit your provider's ability to fully assess your condition. If your provider identifies any concerns that need to be evaluated in person or the need to arrange testing (such as labs, EKG, etc.), we will make arrangements to do so. Although advances in technology are sophisticated, we cannot ensure that it will always work on either your end or our end. If the connection with a video visit is poor, the visit may have to be switched to a telephone visit. With either a video or telephone visit, we are not always able to ensure that we have a secure connection.  By engaging in this virtual visit, you consent to the provision of healthcare and authorize for your insurance to be billed (if applicable) for the services provided during this visit. Depending on your insurance coverage, you may receive a charge related to this service.  I need to obtain your verbal consent now. Are you willing to proceed with your visit today? Viriginia Livingston has provided verbal consent on 09/22/2023 for a virtual visit (video or telephone). Georgana Curio, FNP  Date: 09/22/2023 8:17 AM   Virtual Visit via Video Note   I, Georgana Curio, connected with  Toni Livingston  ([id number], May 07, 1996) on 09/22/23 at  8:15 AM EDT by a video-enabled telemedicine application and verified that I am speaking with the correct person using two identifiers.  Location: Patient: Virtual  Visit Location Patient: Home Provider: Virtual Visit Location Provider: Home Office   I discussed the limitations of evaluation and management by telemedicine and the availability of in person appointments. The patient expressed understanding and agreed to proceed.    History of Present Illness: Toni Livingston is a 28 y.o. who identifies as a female who was assigned female at birth, and is being seen today for nausea , vomiting and diarrhea for 2 days after eating Carne asada. Both people that ate that have been sick. No fever.Marland Kitchen  HPI: HPI  Problems:  Patient Active Problem List   Diagnosis Date Noted   (BMI 30-39.9)-E66.9 05/31/2023    Allergies: No Known Allergies Medications:  Current Outpatient Medications:    certolizumab pegol (CIMZIA, 2 SYRINGE,) 200 MG/ML prefilled syringe, INJECT 2 SYRINGES UNDER THE SKIN EVERY 3 WEEKS Dx M45.8 ankylosing spondylitis and K50.118 Crohn's disease, Disp: 4 each, Rfl: 6   cyanocobalamin (VITAMIN B12) 1000 MCG/ML injection, 1 ml subcut every x 4 wks then 1 ml subcut monthly and dispense with syringes, Disp: 4 mL, Rfl: 11   folic acid (FOLVITE) 1 MG tablet, , Disp: , Rfl:    methotrexate 50 MG/2ML injection, Inject 0.6 mLs (15 mg total) into the skin every 7 (seven) days. *DISCARD UNUSED REMAINDER 30 DAYS AFTER INITIAL USE, Disp: 4 mL, Rfl: 3   prednisoLONE acetate (PRED FORTE) 1 % ophthalmic suspension, as needed., Disp: , Rfl:    sertraline (ZOLOFT) 25 MG tablet, Take 25 mg by mouth daily., Disp: ,  Rfl:    triamcinolone cream (KENALOG) 0.1 %, Apply 1 application. topically 2 (two) times daily as needed., Disp: 80 g, Rfl: 3   Vitamin D, Ergocalciferol, (DRISDOL) 1.25 MG (50000 UNIT) CAPS capsule, Take 1 capsule (50,000 Units total) by mouth every 7 (seven) days., Disp: 4 capsule, Rfl: 3  Observations/Objective: Patient is well-developed, well-nourished in no acute distress.  Resting comfortably  at home.  Head is normocephalic, atraumatic.   No labored breathing.  Speech is clear and coherent with logical content.  Patient is alert and oriented at baseline.    Assessment and Plan: 1. Gastroenteritis (Primary)  Increase fluids, no milk or dairy, UC for further testing if sx persist.   Follow Up Instructions: I discussed the assessment and treatment plan with the patient. The patient was provided an opportunity to ask questions and all were answered. The patient agreed with the plan and demonstrated an understanding of the instructions.  A copy of instructions were sent to the patient via MyChart unless otherwise noted below.     The patient was advised to call back or seek an in-person evaluation if the symptoms worsen or if the condition fails to improve as anticipated.    Georgana Curio, FNP

## 2023-09-22 NOTE — Patient Instructions (Signed)
 Food Choices to Help Relieve Diarrhea, Adult Diarrhea can make you feel weak and cause you to become dehydrated. Dehydration is a condition in which there is not enough water or other fluids in the body. It is important to choose the right foods and drinks to: Relieve diarrhea. Replace lost fluids and nutrients. Prevent dehydration. What are tips for following this plan? Relieving diarrhea Avoid foods that make your diarrhea worse. These may include: Foods and drinks that are sweetened with high-fructose corn syrup, honey, or sweeteners such as xylitol, sorbitol, and mannitol. Check food labels for these ingredients. Fried, greasy, or spicy foods. Raw fruits and vegetables. Eat foods that are rich in probiotics. These include foods such as yogurt and fermented milk products. Probiotics can help increase healthy bacteria in your stomach and intestines (gastrointestinal or GI tract). This may help digestion and stop diarrhea. If you have lactose intolerance, avoid dairy products. These may make your diarrhea worse. Take medicine to help stop diarrhea only as told by your health care provider. Replacing nutrients  Eat bland, easy-to-digest foods in small amounts as you are able, until your diarrhea starts to get better. These foods include bananas, applesauce, rice, toast, and crackers. Over time, add nutrient-rich foods as your body tolerates them or as told by your health care provider. These include: Well-cooked protein foods, such as eggs, lean meats like fish or chicken without skin, and tofu. Peeled, seeded, and soft-cooked fruits and vegetables. Low-fat dairy products. Whole grains. Take vitamin and mineral supplements as told by your health care provider. Preventing dehydration  Start by sipping water or a solution to prevent dehydration (oral rehydration solution, or ORS). This is a drink that helps replace fluids and minerals your body has lost. You can buy an ORS at pharmacies and  retail stores. Try to drink at least 8-10 cups (2,000-2,500 mL) of fluid each day to help replace lost fluids. If your urine is pale yellow, you are getting enough fluids. You may drink other liquids in addition to water, such as fruit juice that you have added water to (diluted fruit juice) or low-calorie sports drinks, as tolerated or as told by your health care provider. Avoid drinks with caffeine, such as coffee, tea, or soft drinks. Avoid alcohol. This information is not intended to replace advice given to you by your health care provider. Make sure you discuss any questions you have with your health care provider. Document Revised: 11/23/2021 Document Reviewed: 11/23/2021 Elsevier Patient Education  2024 [employer].

## 2023-09-26 ENCOUNTER — Telehealth: Payer: Self-pay | Admitting: Internal Medicine

## 2023-09-26 DIAGNOSIS — M25642 Stiffness of left hand, not elsewhere classified: Secondary | ICD-10-CM | POA: Diagnosis not present

## 2023-09-26 DIAGNOSIS — M25542 Pain in joints of left hand: Secondary | ICD-10-CM | POA: Diagnosis not present

## 2023-09-26 NOTE — Telephone Encounter (Signed)
 I received a call from Patient at Risk rep name is Harini, rep stated that they are needing to speak to the nurse regarding why this patient was prescribed CIMIZIA and when was she first diagnosed with her symptoms. Harini stated that if the nurse can give her a call back at [phone] press 1, and then press 1 again to reach patient at risk. Ref # is [id number]. Please advise.

## 2023-09-27 NOTE — Telephone Encounter (Signed)
 Left message to call back

## 2023-09-28 NOTE — Telephone Encounter (Signed)
 Spoke with Patient [facility] Rep (Ref #[id number]) & they were calling to find out an update on patient since she called in 3 weeks ago stating she was having a flare. Advised their rep we'd reach out to patient & call them back at a later time. Ref # above will be needed for future calls.  Contacted patient & she has not been taking Cimzia d/t pending prior authorization per [facility], therefore having altered bowel movements & occasional abd pain/gas. On our end it shows prior authorization was approved. Refer back to phone note 08/16/23 & 09/15/23. Then [facility] was contacted & per Rep Nelma Rothman, her insurance colleague Mel will be contacting insurance to find out further information. In the meantime prior auth approval documentation that we received has been faxed to the number she has provided [phone]. Mel will be contacting our office once she has heard back. Pt has been provided an update.

## 2023-10-01 IMAGING — CT CT CHEST W/O CM
2 of 4 series · 15 of 36 positions shown, 18 images · non-contrast
Comparison: Chest x-ray dated March 09, 2021.

CLINICAL DATA: Chest wall pain and shortness of breath for several
months. No injury.

EXAM:
CT CHEST WITHOUT CONTRAST
TECHNIQUE: Multidetector CT imaging of the chest was performed following the
standard protocol without IV contrast.

[Series 2: routine chest without · axial · non-contrast · 0.64mm/px · z∈[+990,+1240]mm · 12 of 149 slices shown, 15 images]
[im 12/149  mediastinal]
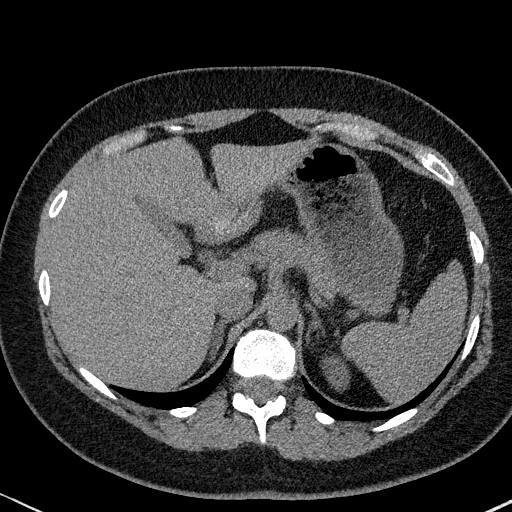
[im 12/149  lung]
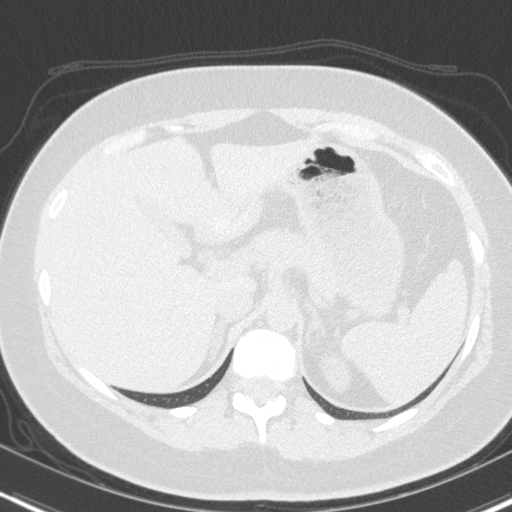
[im 23/149  lung]
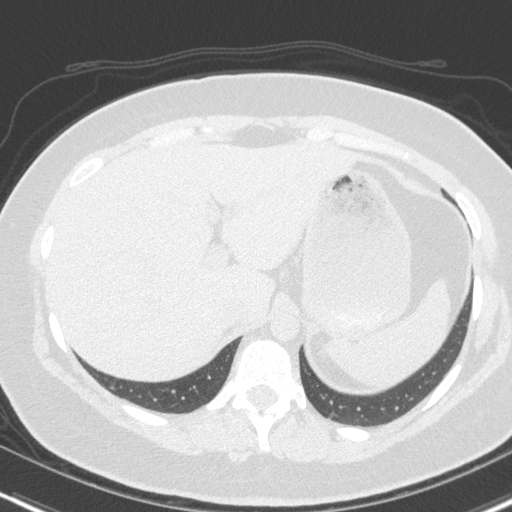
[im 35/149  lung]
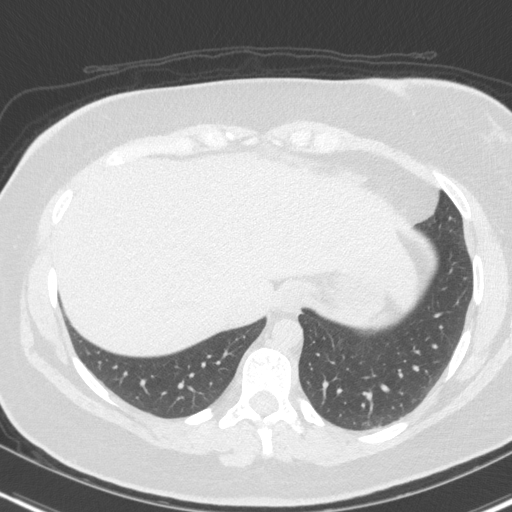
[im 46/149  lung]
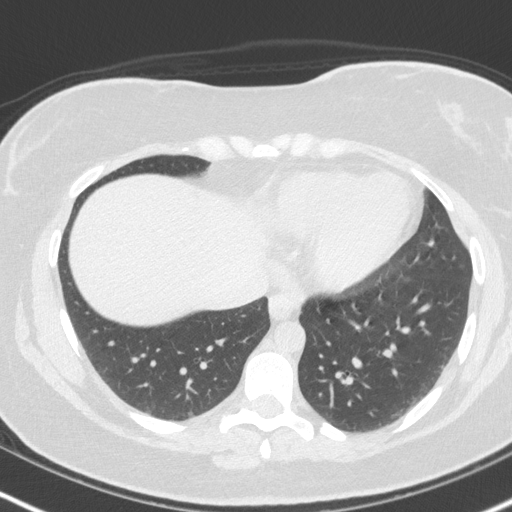
[im 57/149  mediastinal]
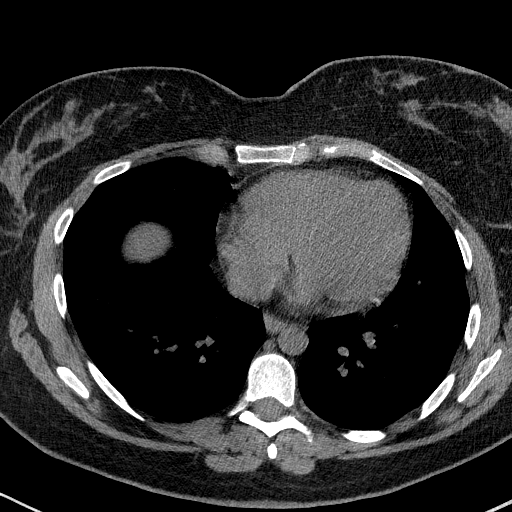
[im 57/149  lung]
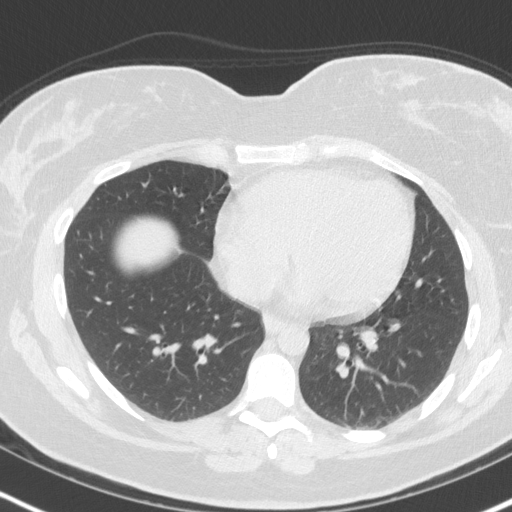
[im 69/149  lung]
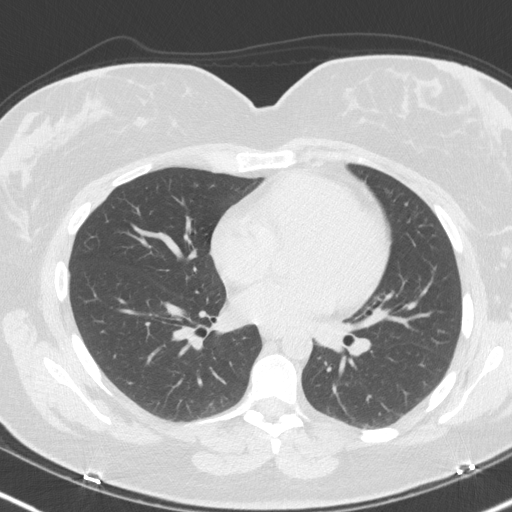
[im 80/149  lung]
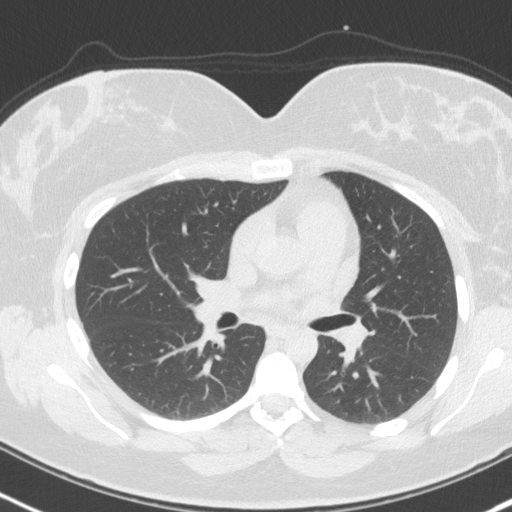
[im 92/149  lung]
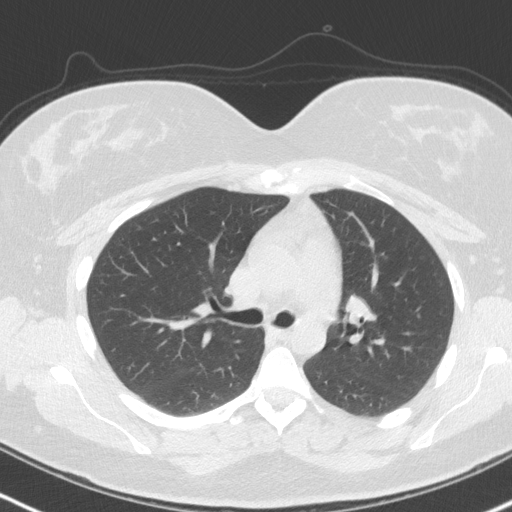
[im 103/149  mediastinal]
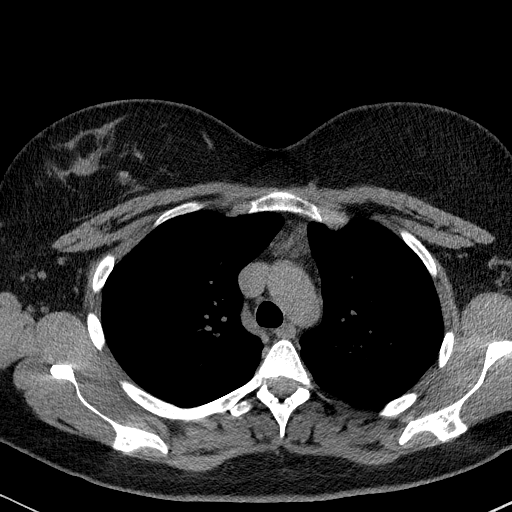
[im 103/149  lung]
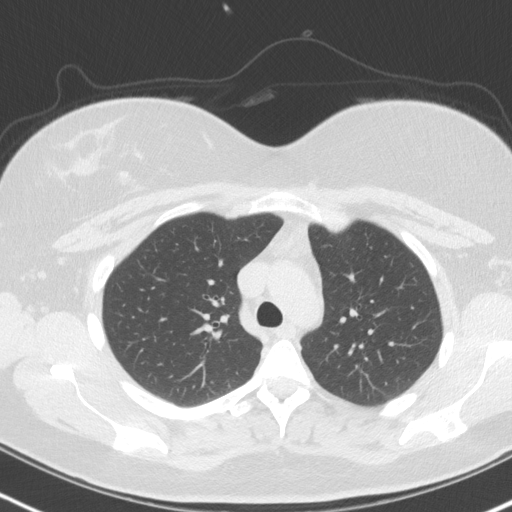
[im 114/149  lung]
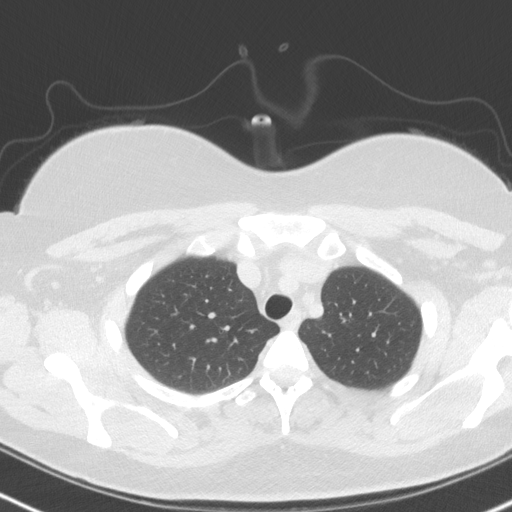
[im 126/149  lung]
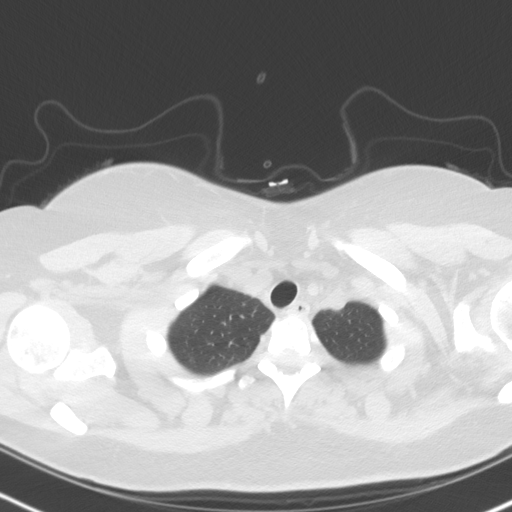
[im 137/149  lung]
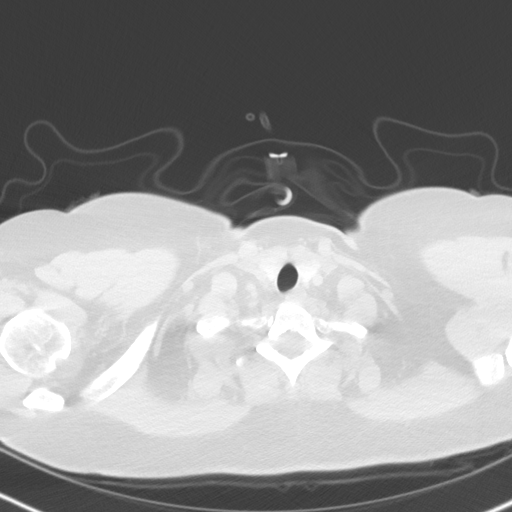

[Series 5: coronal · coronal · 0.63mm/px · 3 of 133 slices shown]
[im 27/133  lung]
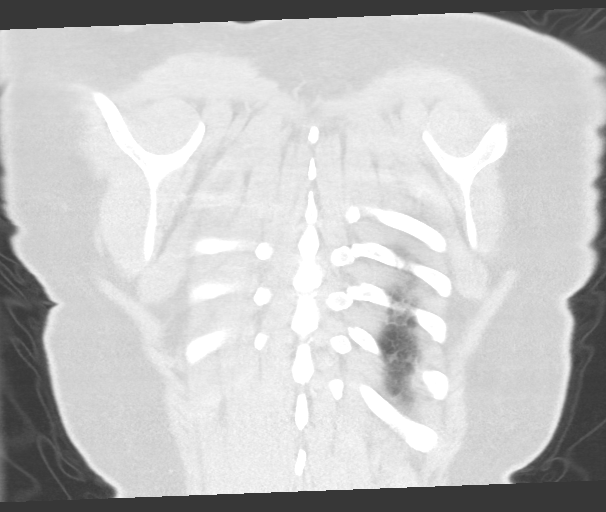
[im 53/133  lung]
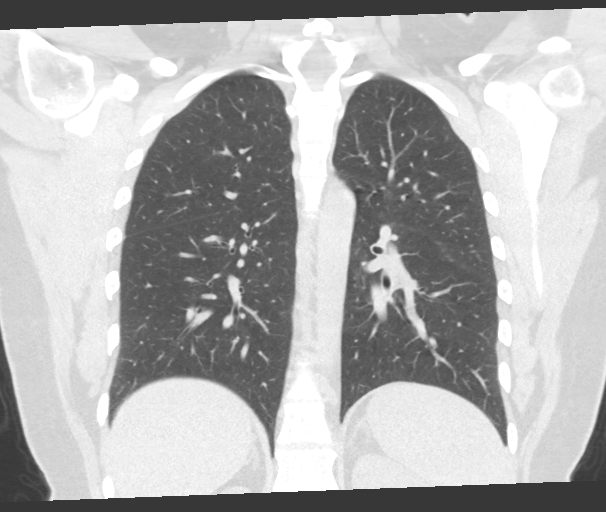
[im 80/133  lung]
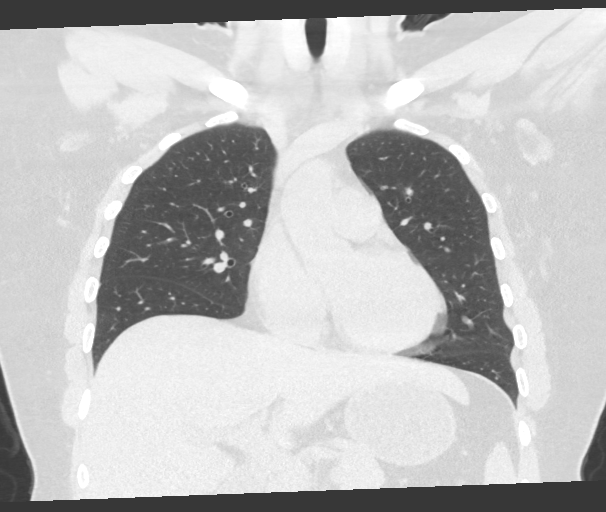

[15 of 36 positions shown; findings below may reference images not displayed]

FINDINGS: Cardiovascular: No significant vascular findings. Normal heart size.
No pericardial effusion.

Mediastinum/Nodes: No enlarged mediastinal or axillary lymph nodes.
Thyroid gland, trachea, and esophagus demonstrate no significant
findings.

Lungs/Pleura: Lungs are clear. No pleural effusion or pneumothorax.

Upper Abdomen: No acute abnormality.

Musculoskeletal: No acute or significant osseous findings.
IMPRESSION: 1. Normal noncontrast chest CT.

## 2023-10-03 DIAGNOSIS — M25542 Pain in joints of left hand: Secondary | ICD-10-CM | POA: Diagnosis not present

## 2023-10-03 DIAGNOSIS — M25642 Stiffness of left hand, not elsewhere classified: Secondary | ICD-10-CM | POA: Diagnosis not present

## 2023-10-03 NOTE — Telephone Encounter (Signed)
 Inbound call from [facility] stating prescription needs to changed to supply of 21, inject very 3 weeks. Or supply of 28 day inject every 4 weeks for insurance to approve. Call back number is [phone]. Please advise, thank you

## 2023-10-03 NOTE — Telephone Encounter (Signed)
 Received form from [employer] requesting further information for prior authorization request. Pt sent messaged.

## 2023-10-04 NOTE — Telephone Encounter (Signed)
 Rep from [facility] called regarding the request below. [phone]

## 2023-10-05 NOTE — Telephone Encounter (Signed)
 Spoke with Rep from [facility] & advised them forms had been faxed back to them. They stated no further documentation was required from our office & that prior auth is under review which may take 24-48 hours, and once approved/denied for the 21 day supply they will let us  know. Pt updated as well.

## 2023-10-13 NOTE — Telephone Encounter (Signed)
 Spoke with Rep from [facility] & PA was denied on 4/18. They stated our office submitted further info on 4/24 for an appeal & it is currently pending.  [phone] (Main Number for [facility] Department)

## 2023-10-16 ENCOUNTER — Encounter: Payer: Self-pay | Admitting: Internal Medicine

## 2023-10-18 NOTE — Telephone Encounter (Signed)
 Medication Samples have been provided to the patient.  Drug name: Cimzia        Strength: 200 mg/mL        Qty: 1  LOT: [id number]  Exp.Date: 04/2024  Dosing instructions: Inject 2 pens into skin every 3 weeks.

## 2023-10-19 ENCOUNTER — Telehealth: Payer: Self-pay

## 2023-10-19 ENCOUNTER — Other Ambulatory Visit (HOSPITAL_COMMUNITY): Payer: Self-pay

## 2023-10-19 NOTE — Telephone Encounter (Signed)
 Secure chat sent to Fate Honor, Cpht with request to discuss this patient's case with me by phone today.

## 2023-10-19 NOTE — Telephone Encounter (Signed)
 There is also a fax dated 10/17/23 from [facility] - requesting labs from 07/2023 be re-faxed as they were not legible and the clinical rationale for dose increase to every 3 weeks. I have printed 07/2023 labs and fecal calprotectin level with rationale. I have faxed back to [employer] at [phone]. A copy of this letter was faxed to PA team this morning.

## 2023-10-19 NOTE — Telephone Encounter (Signed)
 See 5/1 telephone encounter with new request and updated information.

## 2023-10-19 NOTE — Telephone Encounter (Signed)
 Spoke to the patient over the phone regarding the response from the Cimzia  clinical team.  It seems that a dosage of 400 mg every 3 weeks would not be able to be approved. I discussed this with the patient, and offered her the option of going to Cimzia  200 mg every 2 weeks, which the Cimzia  team suggested would be approved, or switching to an alternative medication such as Rinvoq, which would still treat both her Crohn's disease and ankylosing spondylitis.  Patient would like some time to be able to think about this.  In the meantime she would like to go ahead and get approved for Cimzia  200 mg every 2 weeks.  She did get a sample dose of Cimzia  from rheumatology recently, which was her first time taking Cimzia  again in 2.5 months.  Luckily she has not had an excessive amount of GI symptoms.  She did think that taking Cimzia  again seemed to help with her symptoms.    Pod B per triage, lets go ahead and resubmit a prior authorization for Cimzia  200 mg every 2 weeks.  Lets plan for a GI clinic follow-up in 2 to 4 weeks with me.  Okay to overbook.

## 2023-10-19 NOTE — Telephone Encounter (Signed)
 There was an option to e-mail the [facility] Clinical Team, I provided them with all information. Please see response below:  The current HBI score for CD = 4 based on documents provided. HBI scores less than 5 indicate remission of disease, which would not indicate a dose increase would be needed based on CD symptoms. It looks like the fax with the drug levels and antibody levels was not legible when sent over (extremely faded from being printed/faxed). If you would like to send me the info via email, I can provide it for the case for consideration.  In addition - the FDA approved maintenance dosing for Cimzia  is 400 mg every 4 weeks for CD and either 200 mg every 2 weeks or 400 mg every 4 weeks for AS. A dose 400 mg every 3 weeks is above the quantity limit of the plan.   Please consider a dose of 200 mg every 2 weeks or change to another agent as Cimzia  greater than FDA approved dosing is not likely to be approved. In addition, CD based on scoring is controlled - not warranting a need for a dose increase.  Regards,   [facility] Clinical Team

## 2023-10-19 NOTE — Telephone Encounter (Signed)
 Called & spoke with [facility] Rep Maribeth Shivers who informed me that has been marked as urgent and will be updated to a 30 day supply in order to get patient medication.

## 2023-10-19 NOTE — Telephone Encounter (Signed)
 URGENT REQUEST:  Please submit new PA for medication below ASAP - patient has been without medication for months and having active symptoms. There are discrepancies with previous approval (states 1 every 28 days).   Patient needs 2 PENS every 21 DAYS. Last RX was sent to [id number] - [place], [place] - [address], [address].

## 2023-10-19 NOTE — Telephone Encounter (Signed)
 I have sent this note to Dr. Rosaline Coma via email for her recommendations at this time.

## 2023-10-19 NOTE — Telephone Encounter (Signed)
 Hey Devki, Can you look at this one. Looking back at this just would like clarification of who is doing the PA for this medication, if it's Rheum or Gastro.

## 2023-10-20 ENCOUNTER — Other Ambulatory Visit (HOSPITAL_COMMUNITY): Payer: Self-pay

## 2023-10-20 ENCOUNTER — Telehealth: Payer: Self-pay

## 2023-10-20 NOTE — Telephone Encounter (Signed)
 Pharmacy Patient Advocate Encounter   Received notification from Physician's Office that prior authorization for Cimzia  is required/requested.   Insurance verification completed.   The patient is insured through  [employer]  .   Per test claim: PA required; PA submitted to above mentioned insurance via Fax Key/confirmation #/EOC [facility] [phone] Status is pending

## 2023-10-20 NOTE — Telephone Encounter (Signed)
 PA request has been Submitted. New Encounter has been or will be created for follow up. For additional info see Pharmacy Prior Auth telephone encounter from 10-20-2023.

## 2023-10-20 NOTE — Telephone Encounter (Signed)
 Sent pt message via mychart regarding OV. Note routed to PA team.

## 2023-10-24 ENCOUNTER — Other Ambulatory Visit (HOSPITAL_COMMUNITY): Payer: Self-pay

## 2023-10-24 DIAGNOSIS — M25542 Pain in joints of left hand: Secondary | ICD-10-CM | POA: Diagnosis not present

## 2023-10-24 DIAGNOSIS — M25642 Stiffness of left hand, not elsewhere classified: Secondary | ICD-10-CM | POA: Diagnosis not present

## 2023-10-27 ENCOUNTER — Other Ambulatory Visit (HOSPITAL_COMMUNITY): Payer: Self-pay

## 2023-10-27 NOTE — Telephone Encounter (Signed)
 Pharmacy Patient Advocate Encounter  Contacted [employer] to check on status on authorization. Request was denied and sent automatically into a first level appeal request. The rep was able to tell me it was pending as of 05.05.2025 and set it into the urgent queue. Informed it could take up to 30 days but with the urgent mark, it should be sooner.  Will update when determination is received.

## 2023-10-31 DIAGNOSIS — M25642 Stiffness of left hand, not elsewhere classified: Secondary | ICD-10-CM | POA: Diagnosis not present

## 2023-10-31 DIAGNOSIS — M25542 Pain in joints of left hand: Secondary | ICD-10-CM | POA: Diagnosis not present

## 2023-10-31 NOTE — Telephone Encounter (Signed)
 Inbound call from Toni Livingston, state the 21 day supply of the medication is rejecting, Lonzell Robin states that they want to know how the doctor wishes to proceed, she states they made need to submit a new Prior Auth for medication Cimzia .   [phone]

## 2023-10-31 NOTE — Telephone Encounter (Signed)
 Pharmacy Patient Advocate Encounter  This submission is for the every 2 week dosing and is still in review.

## 2023-11-03 ENCOUNTER — Other Ambulatory Visit (HOSPITAL_COMMUNITY): Payer: Self-pay

## 2023-11-03 ENCOUNTER — Telehealth: Payer: Self-pay | Admitting: Internal Medicine

## 2023-11-03 ENCOUNTER — Other Ambulatory Visit: Payer: Self-pay

## 2023-11-03 DIAGNOSIS — H209 Unspecified iridocyclitis: Secondary | ICD-10-CM

## 2023-11-03 DIAGNOSIS — M458 Ankylosing spondylitis sacral and sacrococcygeal region: Secondary | ICD-10-CM

## 2023-11-03 DIAGNOSIS — Z79899 Other long term (current) drug therapy: Secondary | ICD-10-CM

## 2023-11-03 DIAGNOSIS — K50118 Crohn's disease of large intestine with other complication: Secondary | ICD-10-CM

## 2023-11-03 MED ORDER — CIMZIA (2 SYRINGE) 200 MG/ML ~~LOC~~ PSKT: 200.0000 mg | PREFILLED_SYRINGE | SUBCUTANEOUS | 6 refills | Status: DC

## 2023-11-03 NOTE — Telephone Encounter (Signed)
 Received two faxes, [facility] dated 5/12 stating request for Cimzia  200 mg (2 week dosing) has bene approved until 05/01/24 & then a separate fax from [facility] dated 5/13 requesting a prior authorization which looks to be already in the works. Both have been faxed to PA team for further review.

## 2023-11-03 NOTE — Telephone Encounter (Signed)
 Prescription has been sent to pharmacy. Pt made aware via mychart.

## 2023-11-03 NOTE — Telephone Encounter (Signed)
 Contacted [facility] to verify information. There was never a new prescription for the 200mg  every 2 weeks sent over to their pharmacy. Please send new RX to pharmacy and they should have no issues with filling as this has the approved pa. Thanks

## 2023-11-03 NOTE — Telephone Encounter (Signed)
 Spoke with [facility] Rep & stated we just sent order over not too long ago. They are going to recheck the status & let us  know if anything else further is needed.

## 2023-11-03 NOTE — Telephone Encounter (Signed)
 Rep from [facility] stating that they have not received the new Script for Cimiza. Rep is requesting new script be sent over. Good contact information for them is [phone]. Please advise.

## 2023-11-06 ENCOUNTER — Ambulatory Visit: Admitting: Internal Medicine

## 2023-11-06 ENCOUNTER — Other Ambulatory Visit (INDEPENDENT_AMBULATORY_CARE_PROVIDER_SITE_OTHER)

## 2023-11-06 ENCOUNTER — Other Ambulatory Visit (HOSPITAL_BASED_OUTPATIENT_CLINIC_OR_DEPARTMENT_OTHER): Payer: Self-pay

## 2023-11-06 ENCOUNTER — Encounter: Payer: Self-pay | Admitting: Internal Medicine

## 2023-11-06 VITALS — BP 118/80 | HR 81 | Ht 68.0 in | Wt 242.0 lb

## 2023-11-06 DIAGNOSIS — M458 Ankylosing spondylitis sacral and sacrococcygeal region: Secondary | ICD-10-CM

## 2023-11-06 DIAGNOSIS — K50019 Crohn's disease of small intestine with unspecified complications: Secondary | ICD-10-CM

## 2023-11-06 DIAGNOSIS — K649 Unspecified hemorrhoids: Secondary | ICD-10-CM | POA: Diagnosis not present

## 2023-11-06 DIAGNOSIS — R197 Diarrhea, unspecified: Secondary | ICD-10-CM

## 2023-11-06 DIAGNOSIS — E559 Vitamin D deficiency, unspecified: Secondary | ICD-10-CM | POA: Diagnosis not present

## 2023-11-06 LAB — COMPREHENSIVE METABOLIC PANEL WITH GFR
ALT: 18 U/L (ref 0–35)
AST: 16 U/L (ref 0–37)
Albumin: 4.4 g/dL (ref 3.5–5.2)
Alkaline Phosphatase: 107 U/L (ref 39–117)
BUN: 11 mg/dL (ref 6–23)
CO2: 25 meq/L (ref 19–32)
Calcium: 9.4 mg/dL (ref 8.4–10.5)
Chloride: 104 meq/L (ref 96–112)
Creatinine, Ser: 0.64 mg/dL (ref 0.40–1.20)
GFR: 120.21 mL/min (ref >60.00)
Glucose, Bld: 82 mg/dL (ref 70–99)
Potassium: 3.6 meq/L (ref 3.5–5.1)
Sodium: 136 meq/L (ref 135–145)
Total Bilirubin: 0.4 mg/dL (ref 0.2–1.2)
Total Protein: 7.6 g/dL (ref 6.0–8.3)

## 2023-11-06 LAB — CBC WITH DIFFERENTIAL/PLATELET
Basophils Absolute: 0.1 10*3/uL (ref 0.0–0.1)
Basophils Relative: 0.8 % (ref 0.0–3.0)
Eosinophils Absolute: 0.2 10*3/uL (ref 0.0–0.7)
Eosinophils Relative: 3.1 % (ref 0.0–5.0)
HCT: 40.6 % (ref 36.0–46.0)
Hemoglobin: 13.5 g/dL (ref 12.0–15.0)
Lymphocytes Relative: 30.1 % (ref 12.0–46.0)
Lymphs Abs: 2 10*3/uL (ref 0.7–4.0)
MCHC: 33.3 g/dL (ref 30.0–36.0)
MCV: 81.6 fl (ref 78.0–100.0)
Monocytes Absolute: 0.6 10*3/uL (ref 0.1–1.0)
Monocytes Relative: 8.8 % (ref 3.0–12.0)
Neutro Abs: 3.9 10*3/uL (ref 1.4–7.7)
Neutrophils Relative %: 57.2 % (ref 43.0–77.0)
Platelets: 369 10*3/uL (ref 150.0–400.0)
RBC: 4.97 Mil/uL (ref 3.87–5.11)
RDW: 14.5 % (ref 11.5–15.5)
WBC: 6.8 10*3/uL (ref 4.0–10.5)

## 2023-11-06 LAB — HIGH SENSITIVITY CRP: CRP, High Sensitivity: 20.44 mg/L — ABNORMAL HIGH (ref 0.000–5.000)

## 2023-11-06 MED ORDER — METHOTREXATE SODIUM CHEMO INJECTION 50 MG/2ML: 15.0000 mg | INTRAMUSCULAR | 3 refills | Status: DC

## 2023-11-06 MED ORDER — METHOTREXATE SODIUM CHEMO INJECTION 50 MG/2ML
15.0000 mg | INTRAMUSCULAR | 3 refills | Status: DC
  Filled 2023-11-06: qty 4, 28d supply, fill #0

## 2023-11-06 NOTE — Patient Instructions (Addendum)
 Your provider has requested that you go to the basement level for lab work before leaving today. Press "B" on the elevator. The lab is located at the first door on the left as you exit the elevator.  We have sent the following medications to your pharmacy for you to pick up at your convenience: Methotrexate  .  _______________________________________________________  If your blood pressure at your visit was 140/90 or greater, please contact your primary care physician to follow up on this.  _______________________________________________________  If you are age 29 or older, your body mass index should be between 23-30. Your Body mass index is 36.8 kg/m. If this is out of the aforementioned range listed, please consider follow up with your Primary Care Provider.  If you are age 29 or younger, your body mass index should be between 19-25. Your Body mass index is 36.8 kg/m. If this is out of the aformentioned range listed, please consider follow up with your Primary Care Provider.   ________________________________________________________  The [facility] providers would like to encourage you to use MYCHART to communicate with providers for non-urgent requests or questions.  Due to long hold times on the telephone, sending your provider a message by [facility] may be a faster and more efficient way to get a response.  Please allow 48 business hours for a response.  Please remember that this is for non-urgent requests.  _______________________________________________________   Due to recent changes in healthcare laws, you may see the results of your imaging and laboratory studies on MyChart before your provider has had a chance to review them.  We understand that in some cases there may be results that are confusing or concerning to you. Not all laboratory results come back in the same time frame and the provider may be waiting for multiple results in order to interpret others.  Please give us  48 hours  in order for your provider to thoroughly review all the results before contacting the office for clarification of your results.   Thank you for entrusting me with your care and for choosing [facility], Dr. Regino Caprio

## 2023-11-06 NOTE — Progress Notes (Signed)
 Chief Complaint: Crohn's disease  HPI:    Toni Livingston is a 28 year old female with history of ileal Crohn's disease and ankylosing spondylitis presents for follow up of Crohn's disease  Interval History: Cimzia  every 2 weeks has still not been approved. She tried to call her insurance company again today and was told that the insurance approval for Cimzia  every 2 weeks is still in process. 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 Currently she is on her husband's health insurance. Denies ab pain or blood in the stools. She will have an upset stomach from eating certain types of foods. She is still having 1-2 BMs per day. Last dose of Cimzia  was 3 weeks ago, which was a sample that was given by her rheumatologist. Denies any further vision issues currently. Back has been hurting somewhat but she have been doing more physical activity. She had not been taking her methotrexate . She has also not been taking her vitamin B12 or vitamin D  supplements.  She is not planning to become pregnant anytime soon.  Wt Readings from Last 3 Encounters:  11/06/23 242 lb (109.8 kg)  07/26/23 241 lb 6 oz (109.5 kg)  06/28/23 237 lb (107.5 kg)    IBD Characteristics: Type: Crohn's disease Phenotype: Inflammatory, stricturing Perianal involvement: No Location: Ileum (seen on colonoscopy and CT enterography in 2023) Diagnosed: 2023 Extraintestinal manifestations: Iritis, ankylosing spondylitis Previous therapies: Humira  (failed due to development of antibodies and loss of drug level) Current therapy: Cimzia  (started in 09/2021)   Current Outpatient Medications  Medication Sig Dispense Refill   sertraline (ZOLOFT) 25 MG tablet Take 25 mg by mouth daily.     certolizumab pegol  (CIMZIA , 2 SYRINGE,) 200 MG/ML prefilled syringe Inject 200 mg into the skin every 14 (fourteen) days.  (Patient not taking: Reported on 11/06/2023) 1 each 6   cyanocobalamin  (VITAMIN B12) 1000 MCG/ML injection 1 ml subcut every x 4 wks then 1 ml subcut monthly and dispense with syringes (Patient not taking: Reported on 11/06/2023) 4 mL 11   methotrexate  50 MG/2ML injection Inject 0.6 mLs (15 mg total) into the skin every 7 (seven) days. *DISCARD UNUSED REMAINDER 30 DAYS AFTER INITIAL USE 4 mL 3   ondansetron  (ZOFRAN -ODT) 4 MG disintegrating tablet Take 1 tablet (4 mg total) by mouth every 8 (eight) hours as needed for nausea or vomiting. (Patient not taking: Reported on 11/06/2023) 20 tablet 0   prednisoLONE acetate (PRED FORTE) 1 % ophthalmic suspension as needed. (Patient not taking: Reported on 11/06/2023)     triamcinolone  cream (KENALOG ) 0.1 % Apply 1 application. topically 2 (two) times daily as needed. (Patient not taking: Reported on 11/06/2023) 80 g 3   Vitamin D , Ergocalciferol , (DRISDOL ) 1.25 MG (50000 UNIT) CAPS capsule Take 1 capsule (50,000 Units total) by mouth every 7 (seven) days. (Patient not taking: Reported on 11/06/2023) 4 capsule 3   No current facility-administered medications for this visit.     Physical Exam:  Vital signs: BP 118/80   Pulse 81   Ht 5\' 8"  (1.727 m)   Wt 242 lb (109.8 kg)   BMI 36.80 kg/m   Constitutional:   Pleasant Caucasian female appears to be in NAD, Well developed, Well nourished, alert and cooperative Respiratory: Respirations even and unlabored. Lungs clear to auscultation bilaterally.   No wheezes, crackles, or rhonchi.  Cardiovascular: Normal S1,  S2. No MRG. Regular rate Gastrointestinal:  Soft, nontender, nondistended Psychiatric:  Demonstrates good judgement and reason without abnormal affect or behaviors.  RELEVANT LABS AND IMAGING: CBC    Component Value Date/Time   WBC 6.2 07/26/2023 0950   RBC 5.01 07/26/2023 0950   HGB 14.4 07/26/2023 0950   HCT 42.7 07/26/2023 0950   PLT 400.0 07/26/2023 0950   MCV 85.2 07/26/2023 0950   MCH 27.4  06/28/2023 1541   MCHC 33.7 07/26/2023 0950   RDW 14.6 07/26/2023 0950   LYMPHSABS 1.8 07/26/2023 0950   MONOABS 0.4 07/26/2023 0950   EOSABS 0.2 07/26/2023 0950   BASOSABS 0.0 07/26/2023 0950    CMP     Component Value Date/Time   NA 139 07/26/2023 0950   K 4.0 07/26/2023 0950   CL 105 07/26/2023 0950   CO2 24 07/26/2023 0950   GLUCOSE 83 07/26/2023 0950   BUN 9 07/26/2023 0950   CREATININE 0.64 07/26/2023 0950   CREATININE 0.73 06/28/2023 1541   CALCIUM  9.2 07/26/2023 0950   PROT 7.7 07/26/2023 0950   ALBUMIN 4.4 07/26/2023 0950   AST 16 07/26/2023 0950   ALT 22 07/26/2023 0950   ALKPHOS 106 07/26/2023 0950   BILITOT 0.5 07/26/2023 0950   GFRNONAA >60 11/07/2022 0258   GFRNONAA 122 10/06/2020 1132   GFRAA 141 10/06/2020 1132   Labs 08/2021: Humira  level <0.6. Humira  antibody level of 3820.   Labs 11/2021: CBC and CMP unremarkable.  Labs 02/2022: CBC and CMP unremarkable. Fecal calprotectin 100 mg (borderline). Quant gold negative. ESR mildly elevated at 21.   Labs 05/2022: CBC and CMP unremarkable  Labs 08/2022: [facility] GI pathogen panel. Stool test was positive for enteropathogenic E coli (EPEC). C dif was negative. Fecal calprotectin was elevated at 197. Cimzia  level at 39 and antibody level at 246. ESR elevated at 36. CRP elevated at 18.51. CBC nml.   Labs 10/2022: CBC with elevated WBC of 16.9 and low Hb of 11.6. CMP with low Na of 131, low K of 3.2, elevated alk phos of 144, low albumin of 3.2.   Labs 12/2022: CBC normal.  CMP with a mildly low potassium level of 3.4.  Cimzia  drug level was 43 and antibody levels 511.  CRP is elevated at 14.8.  ESR is elevated at 31.  Vitamin B12 was low at 202.  Vitamin D , iron, and folate were normal.  TPMT enzyme activity was normal.  Fecal calprotectin normal.  Labs 01/2023: CBC normal.  CMP normal.  QuantiFERON gold negative.  Labs 03/2023: CRP normal.  Cimzia  level 21.  Cimzia  antibody level elevated at 1657.  CBC and CMP  normal.  Labs 05/2023: Quant gold negative  Labs 07/2023: CBC and CMP nml. Vit D low at 14. Vit B12 nml. CRP nml. Cimzia  level 20 and Cimzia  antibody 569. Fecal calprotectin elevated at 317.  CT enterography 09/06/21: IMPRESSION: 1. There is short segment circumferential wall thickening and mucosal hyperenhancement of the terminal ileum, involving a segment approximately 4 cm in length from the ileocecal valve. This appearance is in keeping with Crohn's ileitis. No evidence of complicating stricture, fistula, or abscess at this time. No other evidence of bowel inflammation. 2. IUD is present in the uterus, although in an abnormal appearing low lying position, within the lower uterine segment and or endocervical canal. Consider pelvic ultrasound to assess appropriate positioning. 3. No osseous stigmata of ankylosing spondylitis by CT.  Colonoscopy 08/16/21: - Inflammed and strictured terminal ileum, otherwise the examination  was normal. - Biopsies taken from terminal ileum, right and left colon. - The examination was otherwise normal on direct and retroflexion views. Path: 1. Surgical [P], small bowel, terminal ileum - SEVERELY ACTIVE CHRONIC, NONSPECIFIC ILEITIS - NO GRANULOMAS, DYSPLASIA OR MALIGNANCY IDENTIFIED - SEE COMMENT 2. Surgical [P], right colon biopsy - BENIGN COLONIC MUCOSA - NO ACTIVE INFLAMMATION OR EVIDENCE OF MICROSCOPIC COLITIS - NO HIGH-GRADE DYSPLASIA OR MALIGNANCY IDENTIFIED 3. Surgical [P], left colon biopsy - BENIGN COLONIC MUCOSA - NO ACTIVE INFLAMMATION OR EVIDENCE OF MICROSCOPIC COLITIS - NO HIGH-GRADE DYSPLASIA OR MALIGNANCY IDENTIFIED Microscopic Comment 1. The biopsy is scant and not typical of inflammatory bowel disease. The differential diagnosis would include drugs, infection and inflammatory bowel disease. Radiologic correlation and patient follow-up is suggested. Dr. Bernetta Brilliant reviewed the case and agrees with the above diagnosis.  Assessment: Small bowel  Crohn's disease Loose stools Hemorrhoids We had a lot of difficulty getting her Cimzia  400 mg every 3 weeks approved, and thus we have now transitioned to trying to get Cimzia  200 mg every 2 weeks approved.  Unfortunately despite notes from her insurance that suggest the 200 mg every 2 weeks dosing should be approved, she has still not gotten approval for her Cimzia  yet.  Patient has been getting samples of Cimzia  from her rheumatologist to support her through this period.  I recommended that she go ahead and start the 200 mg every 2 weeks with the samples that she is receiving.  I did discuss the idea of switching her to Rinvoq, which would cover both her Crohn's disease as well as ankylosing spondylitis, but the patient would like to stay on Cimzia  since this has been effective for her.  Luckily from a clinical standpoint she is relatively asymptomatic at this time.  Patient did take a hiatus from her methotrexate  so I asked her to restart this along with her folic acid .  Will also have her restart her vitamin D  and B12 supplements.  Plan: - Plan to check labs with CBC, CMP, CRP, Cimzia  level and antibody, vitamin D  - Cont vitamin D  and vitamin B12 supplements. - Next QuantiFERON gold is due in 05/2024 - Continue Cimzia  - Cont methotrexate  15 mcg SQ weekly. [facility] - Cont folic acid  1 mg every day - Cont vision exams with eye doctor - Patient is established with a dermatologist and gets regular skin checks - RTC in 3 months  I spent 41 minutes of time, including in depth chart review, independent review of results as outlined above, communicating results with the patient directly, face-to-face time with the patient, coordinating care, ordering studies and medications as appropriate, and documentation.

## 2023-11-07 ENCOUNTER — Ambulatory Visit: Payer: Self-pay | Admitting: Internal Medicine

## 2023-11-07 LAB — VITAMIN B12: Vitamin B-12: 241 pg/mL (ref 211–911)

## 2023-11-07 LAB — VITAMIN D 25 HYDROXY (VIT D DEFICIENCY, FRACTURES): VITD: 16.36 ng/mL — ABNORMAL LOW (ref 30.00–100.00)

## 2023-11-08 ENCOUNTER — Encounter: Payer: Self-pay | Admitting: Internal Medicine

## 2023-11-09 ENCOUNTER — Other Ambulatory Visit: Payer: Self-pay | Admitting: Internal Medicine

## 2023-11-09 ENCOUNTER — Other Ambulatory Visit: Payer: Self-pay

## 2023-11-10 ENCOUNTER — Other Ambulatory Visit: Payer: Self-pay

## 2023-11-10 MED ORDER — SAFETY SYRINGE/NEEDLE 27G X 1/2" 1 ML MISC: 0 refills | Status: AC

## 2023-11-14 DIAGNOSIS — M25542 Pain in joints of left hand: Secondary | ICD-10-CM | POA: Diagnosis not present

## 2023-11-14 DIAGNOSIS — M25642 Stiffness of left hand, not elsewhere classified: Secondary | ICD-10-CM | POA: Diagnosis not present

## 2023-11-15 LAB — SERIAL MONITORING

## 2023-11-16 LAB — CERTOLIZUMAB AND ANTI-CERTO AB
Anti-Certolizumab Ab Level: 333 ng/mL
Certolizumab DRUG Level: 17 ug/mL

## 2023-11-16 NOTE — Progress Notes (Deleted)
 Office Visit Note  Patient: Toni Livingston             Date of Birth: 15-Jan-1996           MRN: [id number]             PCP: Mariel Shope, DO Referring: Mariel Shope, DO Visit Date: 11/30/2023 Occupation: @GUAROCC @  Subjective:  No chief complaint on file.   History of Present Illness: Toni Livingston is a 28 y.o. female ***     Activities of Daily Living:  Patient reports morning stiffness for *** {minute/hour:19697}.   Patient {ACTIONS;DENIES/REPORTS:21021675::"Denies"} nocturnal pain.  Difficulty dressing/grooming: {ACTIONS;DENIES/REPORTS:21021675::"Denies"} Difficulty climbing stairs: {ACTIONS;DENIES/REPORTS:21021675::"Denies"} Difficulty getting out of chair: {ACTIONS;DENIES/REPORTS:21021675::"Denies"} Difficulty using hands for taps, buttons, cutlery, and/or writing: {ACTIONS;DENIES/REPORTS:21021675::"Denies"}  No Rheumatology ROS completed.   PMFS History:  Patient Active Problem List   Diagnosis Date Noted   (BMI 30-39.9)-E66.9 05/31/2023    Past Medical History:  Diagnosis Date   Ankylosing spondylitis (HCC)    Closed fracture of fifth metacarpal bone 07/19/2019   Crohn's disease (HCC)    Iritis    LGSIL of cervix of undetermined significance 11/14/2018   Normal labor 11/07/2022   SVD (spontaneous vaginal delivery) 11/07/2022   Vaginal high risk HPV DNA test positive 11/19/2018   Vertigo     Family History  Problem Relation Age of Onset   Hypertension Mother    Colon polyps Father    Testicular cancer Father    Healthy Sister    Depression Sister    Anxiety disorder Sister    Healthy Brother    Healthy Brother    Diabetes Maternal Grandmother    Emphysema Maternal Grandmother    Breast cancer Maternal Grandmother    Diabetes Maternal Grandfather    Healthy Son    Uterine cancer Maternal Aunt    Esophageal cancer Neg Hx    Past Surgical History:  Procedure Laterality Date   BREAST REDUCTION SURGERY  2015    COLONOSCOPY  07/2021   HAND SURGERY Left 07/24/2019   Social History   Social History Narrative   Single. Some college.    Works as Merchant [employer] officer.    Drinks caffeine.    Wears seatbelt. Smoke detector in the home.    Exercises routinely.    Feels safe in relationships.       Immunization History  Administered Date(s) Administered   Hepb-cpg 07/26/2023, 08/24/2023   Influenza, Seasonal, Injecte, Preservative Fre 05/31/2023   Influenza,inj,Quad PF,6+ Mos 03/09/2017, 03/05/2018, 02/27/2019, 06/04/2021   Influenza-Unspecified 03/20/2016, 04/04/2022   PFIZER(Purple Top)SARS-COV-2 Vaccination 03/23/2020, 04/13/2020, 08/07/2020   PPD Test 07/27/2016   Tdap 07/27/2016     Objective: Vital Signs: There were no vitals taken for this visit.   Physical Exam   Musculoskeletal Exam: ***  CDAI Exam: CDAI Score: -- Patient Global: --; Provider Global: -- Swollen: --; Tender: -- Joint Exam 11/30/2023   No joint exam has been documented for this visit   There is currently no information documented on the homunculus. Go to the Rheumatology activity and complete the homunculus joint exam.  Investigation: No additional findings.  Imaging: No results found.  Recent Labs: Lab Results  Component Value Date   WBC 6.8 11/06/2023   HGB 13.5 11/06/2023   PLT 369.0 11/06/2023   NA 136 11/06/2023   K 3.6 11/06/2023   CL 104 11/06/2023   CO2 25 11/06/2023   GLUCOSE 82 11/06/2023   BUN 11 11/06/2023   CREATININE 0.64  11/06/2023   BILITOT 0.4 11/06/2023   ALKPHOS 107 11/06/2023   AST 16 11/06/2023   ALT 18 11/06/2023   PROT 7.6 11/06/2023   ALBUMIN 4.4 11/06/2023   CALCIUM  9.4 11/06/2023   GFRAA 141 10/06/2020   QFTBGOLDPLUS NEGATIVE 05/31/2023    Speciality Comments: humira - inadequate response Cimzia  started September 29, 2021  Procedures:  No procedures performed Allergies: Patient has no known allergies.   Assessment / Plan:     Visit Diagnoses: No diagnosis  found.  Orders: No orders of the defined types were placed in this encounter.  No orders of the defined types were placed in this encounter.   Face-to-face time spent with patient was *** minutes. Greater than 50% of time was spent in counseling and coordination of care.  Follow-Up Instructions: No follow-ups on file.   Dee Farber, CMA  Note - This record has been created using Animal nutritionist.  Chart creation errors have been sought, but may not always  have been located. Such creation errors do not reflect on  the standard of medical care.

## 2023-11-28 NOTE — Progress Notes (Signed)
 Office Visit Note  Patient: Toni Livingston             Date of Birth: 06-12-1996           MRN: [id number]             PCP: Mariel Shope, DO Referring: Mariel Shope, DO Visit Date: 12/04/2023 Occupation: @GUAROCC @  Subjective:  Medication monitoring  History of Present Illness: Arisha Gervais is a 28 y.o. female with history of ankylosing spondylitis.   Patient is currently prescribed Cimzia  200 mg sq injections every 2 weeks, methotrexate  0.6 mL sq injections once weekly, and folic acid  1 mg daily.  Patient states that in spring 2025 she was off of Cimzia  for about 6 weeks while awaiting approval and shipment of Cimzia  to her house.  Patient came for a sample of Cimzia  on 10/18/2023.  She has been back on Cimzia  without interruption since then.  Patient states that she was unsure if she was able to continue methotrexate  while off of Cimzia  so she held methotrexate  as well.  Patient states that while off of therapy she had increased discomfort and stiffness in her lower back.  Her lower back pain is started to improve.  She has not had any nocturnal pain.  She denies any Achilles tendinitis or plantar fasciitis.  She denies any joint swelling.  She denies any signs or symptoms of uveitis flare.  She denies any recent or recurrent infections.  She denies any new medical conditions.   Activities of Daily Living:  Patient denies morning stiffness   Patient Denies nocturnal pain.  Difficulty dressing/grooming: Denies Difficulty climbing stairs: Denies Difficulty getting out of chair: Denies Difficulty using hands for taps, buttons, cutlery, and/or writing: Denies  Review of Systems  Constitutional:  Positive for fatigue.  HENT:  Negative for mouth sores and mouth dryness.   Eyes:  Negative for dryness.  Respiratory:  Negative for shortness of breath.   Cardiovascular:  Negative for chest pain and palpitations.  Gastrointestinal:  Positive for diarrhea. Negative  for blood in stool and constipation.  Endocrine: Negative for increased urination.  Genitourinary:  Negative for involuntary urination.  Musculoskeletal:  Negative for joint pain, gait problem, joint pain, joint swelling, myalgias, muscle weakness, morning stiffness, muscle tenderness and myalgias.  Skin:  Negative for color change, rash, hair loss and sensitivity to sunlight.  Allergic/Immunologic: Negative for susceptible to infections.  Neurological:  Negative for dizziness and headaches.  Hematological:  Negative for swollen glands.  Psychiatric/Behavioral:  Negative for depressed mood and sleep disturbance. The patient is not nervous/anxious.     PMFS History:  Patient Active Problem List   Diagnosis Date Noted   (BMI 30-39.9)-E66.9 05/31/2023    Past Medical History:  Diagnosis Date   Ankylosing spondylitis (HCC)    Closed fracture of fifth metacarpal bone 07/19/2019   Crohn's disease (HCC)    Iritis    LGSIL of cervix of undetermined significance 11/14/2018   Normal labor 11/07/2022   SVD (spontaneous vaginal delivery) 11/07/2022   Vaginal high risk HPV DNA test positive 11/19/2018   Vertigo     Family History  Problem Relation Age of Onset   Hypertension Mother    Colon polyps Father    Testicular cancer Father    Healthy Sister    Depression Sister    Anxiety disorder Sister    Healthy Brother    Healthy Brother    Diabetes Maternal Grandmother    Emphysema Maternal  Grandmother    Breast cancer Maternal Grandmother    Diabetes Maternal Grandfather    Healthy Son    Uterine cancer Maternal Aunt    Esophageal cancer Neg Hx    Past Surgical History:  Procedure Laterality Date   BREAST REDUCTION SURGERY  2015   COLONOSCOPY  07/2021   HAND SURGERY Left 07/24/2019   Social History   Social History Narrative   Single. Some college.    Works as Merchant [employer] officer.    Drinks caffeine.    Wears seatbelt. Smoke detector in the home.    Exercises routinely.     Feels safe in relationships.       Immunization History  Administered Date(s) Administered   Hepb-cpg 07/26/2023, 08/24/2023   Influenza, Seasonal, Injecte, Preservative Fre 05/31/2023   Influenza,inj,Quad PF,6+ Mos 03/09/2017, 03/05/2018, 02/27/2019, 06/04/2021   Influenza-Unspecified 03/20/2016, 04/04/2022   PFIZER(Purple Top)SARS-COV-2 Vaccination 03/23/2020, 04/13/2020, 08/07/2020   PPD Test 07/27/2016   Tdap 07/27/2016     Objective: Vital Signs: BP 119/85 (BP Location: Left Arm, Patient Position: Sitting, Cuff Size: Normal)   Pulse 83   Ht 5' 8 (1.727 m)   Wt 242 lb (109.8 kg)   BMI 36.80 kg/m    Physical Exam Vitals and nursing note reviewed.  Constitutional:      Appearance: She is well-developed.  HENT:     Head: Normocephalic and atraumatic.   Eyes:     Conjunctiva/sclera: Conjunctivae normal.    Cardiovascular:     Rate and Rhythm: Normal rate and regular rhythm.     Heart sounds: Normal heart sounds.  Pulmonary:     Effort: Pulmonary effort is normal.     Breath sounds: Normal breath sounds.  Abdominal:     General: Bowel sounds are normal.     Palpations: Abdomen is soft.   Musculoskeletal:     Cervical back: Normal range of motion.  Lymphadenopathy:     Cervical: No cervical adenopathy.   Skin:    General: Skin is warm and dry.     Capillary Refill: Capillary refill takes less than 2 seconds.   Neurological:     Mental Status: She is alert and oriented to person, place, and time.   Psychiatric:        Behavior: Behavior normal.      Musculoskeletal Exam: C-spine, thoracic spine, lumbar spine and good range of motion.  No midline spinal tenderness.  No SI joint tenderness.  Shoulder joints, elbow joints, wrist joints, MCPs, PIPs, DIPs have good range of motion with no synovitis.  Complete fist formation bilaterally.  Hip joints have good range of motion with no groin pain.  Knee joints have good range of motion no warmth or effusion.  Ankle  joints have good range of motion with no tenderness or joint swelling.  No evidence of Achilles tendinitis or plantar fasciitis.  CDAI Exam: CDAI Score: -- Patient Global: --; Provider Global: -- Swollen: --; Tender: -- Joint Exam 12/04/2023   No joint exam has been documented for this visit   There is currently no information documented on the homunculus. Go to the Rheumatology activity and complete the homunculus joint exam.  Investigation: No additional findings.  Imaging: No results found.  Recent Labs: Lab Results  Component Value Date   WBC 6.8 11/06/2023   HGB 13.5 11/06/2023   PLT 369.0 11/06/2023   NA 136 11/06/2023   K 3.6 11/06/2023   CL 104 11/06/2023   CO2 25 11/06/2023   GLUCOSE  82 11/06/2023   BUN 11 11/06/2023   CREATININE 0.64 11/06/2023   BILITOT 0.4 11/06/2023   ALKPHOS 107 11/06/2023   AST 16 11/06/2023   ALT 18 11/06/2023   PROT 7.6 11/06/2023   ALBUMIN 4.4 11/06/2023   CALCIUM  9.4 11/06/2023   GFRAA 141 10/06/2020   QFTBGOLDPLUS NEGATIVE 05/31/2023    Speciality Comments: humira - inadequate response Cimzia  started September 29, 2021  Procedures:  No procedures performed Allergies: Patient has no known allergies.      Assessment / Plan:     Visit Diagnoses: Ankylosing spondylitis of sacral region (HCC) - - History of chronic SI joint pain, SI joint sclerosis on the x-rays, HLA-B27 positive. SI joints injected on 05/27/2020: Patient is currently on Cimzia  200 mg sq injections every 2 weeks, methotrexate  0.6 mg sq injections once weekly, and folic acid  1 mg daily.  She is tolerating combination therapy without any side effects and has not had any recent infections.  Patient recently had at least a 6-week gap in therapy due to issues with insurance--while off of combination therapy she was having increased discomfort in both SI joints which is since subsided since resuming her medication regimen.  She has no SI joint tenderness upon palpation at this  time.  No nocturnal pain.  No evidence of Achilles tendinitis or plantar fasciitis.  No synovitis or dactylitis noted on exam.  She good range of motion of both hip joints with no groin pain currently.  No signs or symptoms of uveitis flare.  Her Crohn's disease remains relatively asymptomatic at this time.  She will remain on Cimzia  200 mg sq injections every 2 weeks, methotrexate  0.6 mg sq injections once weekly, and folic acid  1 mg daily.  She will notify us  if she develops any signs or symptoms of a flare.  She will follow-up in the office in 5 months or sooner if needed.  High risk medication use: Cimzia  200 mg sq injections every 2 weeks, started September 21, 2021. Methotrexate  0.6 ml sq injections once weekly and folic acid  1 mg daily.  Injectable methotrexate  was added in October 2024. Contraception: Nexplanon . Not breastfeeding-son is currently 17 months old.  Currently has a Nexplanon  for contraception. Previous therapy: Humira -inadequate response.  Azathioprine  discontinued due to GI side effects. TB gold negative on 05/31/23.  CBC and CMP updated on 11/06/23.  Her next lab work will be due in August and every 3 months to monitor for drug toxicity. No recent or recurrent infections.  Discussed the importance of holding cimzia  if she develops signs or symptoms of an infection and to resume once the infection hs completely cleared.   Iridocyclitis - Dr. Mason Sole.  Inadequate response to Humira . No signs of active inflammation on ocular exam on 08/30/22 and 07/07/23.   No signs or symptoms of a flare.  No medication changes will be made at this time.   HLA B27 positive  Crohn's disease of colon with other complication (HCC) - Under the care of Dr. Rosaline Coma. Methotrexate  added October 2024.   Relatively asymptomatic--reviewed GI OV note 11/16/23--continue cimzia  and methotrexate  as prescribed.   Chronic SI joint pain: Follow-up for Cimzia  and methotrexate  for about 6 weeks she had increased discomfort in  her lower back.  On examination today she had no SI joint tenderness upon palpation.  No nocturnal pain.  Overall her symptoms remain stable if she has no interruptions in therapy.  Other medical conditions are listed as follows:  ANA positive: No clinical features of  systemic lupus at this time.  Other acne  Pilonidal cyst  Vaginal high risk HPV DNA test positive  Orders: No orders of the defined types were placed in this encounter.  No orders of the defined types were placed in this encounter.   Follow-Up Instructions: Return in about 5 months (around 05/05/2024).   Romayne Clubs, PA-C  Note - This record has been created using Dragon software.  Chart creation errors have been sought, but may not always  have been located. Such creation errors do not reflect on  the standard of medical care.

## 2023-11-30 ENCOUNTER — Ambulatory Visit: Payer: BC Managed Care – PPO | Admitting: Rheumatology

## 2023-11-30 DIAGNOSIS — K50118 Crohn's disease of large intestine with other complication: Secondary | ICD-10-CM

## 2023-11-30 DIAGNOSIS — R768 Other specified abnormal immunological findings in serum: Secondary | ICD-10-CM

## 2023-11-30 DIAGNOSIS — Z1589 Genetic susceptibility to other disease: Secondary | ICD-10-CM

## 2023-11-30 DIAGNOSIS — H209 Unspecified iridocyclitis: Secondary | ICD-10-CM

## 2023-11-30 DIAGNOSIS — M458 Ankylosing spondylitis sacral and sacrococcygeal region: Secondary | ICD-10-CM

## 2023-11-30 DIAGNOSIS — R87811 Vaginal high risk human papillomavirus (HPV) DNA test positive: Secondary | ICD-10-CM

## 2023-11-30 DIAGNOSIS — L0591 Pilonidal cyst without abscess: Secondary | ICD-10-CM

## 2023-11-30 DIAGNOSIS — Z79899 Other long term (current) drug therapy: Secondary | ICD-10-CM

## 2023-11-30 DIAGNOSIS — G8929 Other chronic pain: Secondary | ICD-10-CM

## 2023-11-30 DIAGNOSIS — L708 Other acne: Secondary | ICD-10-CM

## 2023-12-04 ENCOUNTER — Encounter: Payer: Self-pay | Admitting: Physician Assistant

## 2023-12-04 ENCOUNTER — Ambulatory Visit: Attending: Physician Assistant | Admitting: Physician Assistant

## 2023-12-04 VITALS — BP 119/85 | HR 83 | Ht 68.0 in | Wt 242.0 lb

## 2023-12-04 DIAGNOSIS — M533 Sacrococcygeal disorders, not elsewhere classified: Secondary | ICD-10-CM

## 2023-12-04 DIAGNOSIS — Z79899 Other long term (current) drug therapy: Secondary | ICD-10-CM

## 2023-12-04 DIAGNOSIS — R87811 Vaginal high risk human papillomavirus (HPV) DNA test positive: Secondary | ICD-10-CM

## 2023-12-04 DIAGNOSIS — K50118 Crohn's disease of large intestine with other complication: Secondary | ICD-10-CM

## 2023-12-04 DIAGNOSIS — G8929 Other chronic pain: Secondary | ICD-10-CM

## 2023-12-04 DIAGNOSIS — R768 Other specified abnormal immunological findings in serum: Secondary | ICD-10-CM

## 2023-12-04 DIAGNOSIS — L0591 Pilonidal cyst without abscess: Secondary | ICD-10-CM

## 2023-12-04 DIAGNOSIS — L708 Other acne: Secondary | ICD-10-CM

## 2023-12-04 DIAGNOSIS — H209 Unspecified iridocyclitis: Secondary | ICD-10-CM

## 2023-12-04 DIAGNOSIS — M458 Ankylosing spondylitis sacral and sacrococcygeal region: Secondary | ICD-10-CM | POA: Diagnosis not present

## 2023-12-04 DIAGNOSIS — Z1589 Genetic susceptibility to other disease: Secondary | ICD-10-CM | POA: Diagnosis not present

## 2023-12-04 NOTE — Patient Instructions (Signed)
 Standing Labs We placed an order today for your standing lab work.   Please have your standing labs drawn in August and every 3 months   Please have your labs drawn 2 weeks prior to your appointment so that the provider can discuss your lab results at your appointment, if possible.  Please note that you may see your imaging and lab results in MyChart before we have reviewed them. We will contact you once all results are reviewed. Please allow our office up to 72 hours to thoroughly review all of the results before contacting the office for clarification of your results.  WALK-IN LAB HOURS  Monday through Thursday from 8:00 am -12:30 pm and 1:00 pm-4:00 pm and Friday from 8:00 am-12:00 pm.  Patients with office visits requiring labs will be seen before walk-in labs.  You may encounter longer than normal wait times. Please allow additional time. Wait times may be shorter on  Monday and Thursday afternoons.  We do not book appointments for walk-in labs. We appreciate your patience and understanding with our staff.   Labs are drawn by [facility]. Please bring your co-pay at the time of your lab draw.  You may receive a bill from [facility] for your lab work.  Please note if you are on Hydroxychloroquine  and and an order has been placed for a Hydroxychloroquine  level,  you will need to have it drawn 4 hours or more after your last dose.  If you wish to have your labs drawn at another location, please call the office 24 hours in advance so we can fax the orders.  The office is located at [address], [address], [place], [place] [address]   If you have any questions regarding directions or hours of operation,  please call [phone].   As a reminder, please drink plenty of water prior to coming for your lab work. Thanks!

## 2023-12-27 IMAGING — CT CT ENTEROGRAPHY (ABD-PELV W/ CM)
2 of 5 series · 14 of 46 positions shown, 16 images · IV contrast (OMNIPAQUE 300)
Comparison: None.

CLINICAL DATA: Inflammation of terminal ileum seen on recent
colonoscopy, possible Crohn's disease, low abdominal pain, nausea,
bloating, diarrhea, history of ankylosing spondylitis on Humira

EXAM:
CT ABDOMEN AND PELVIS WITH CONTRAST (ENTEROGRAPHY)
TECHNIQUE: Multidetector CT of the abdomen and pelvis during bolus
administration of intravenous contrast. Negative oral contrast was
given.

[Series 4: entero thins · axial · 0.77mm/px · z∈[-541,-53]mm · 11 of 272 slices shown, 13 images]
[im 14/272  soft-tissue]
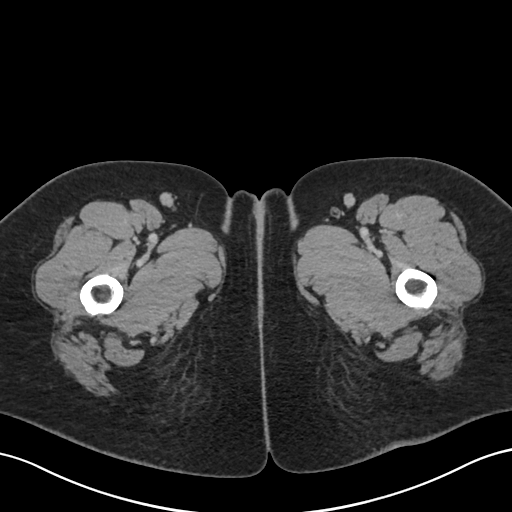
[im 14/272  bone]
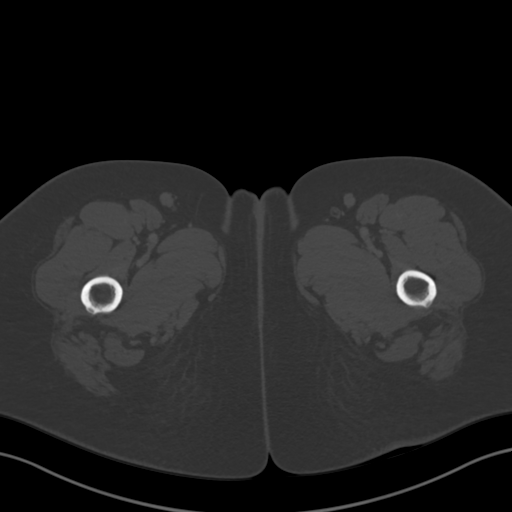
[im 41/272  soft-tissue]
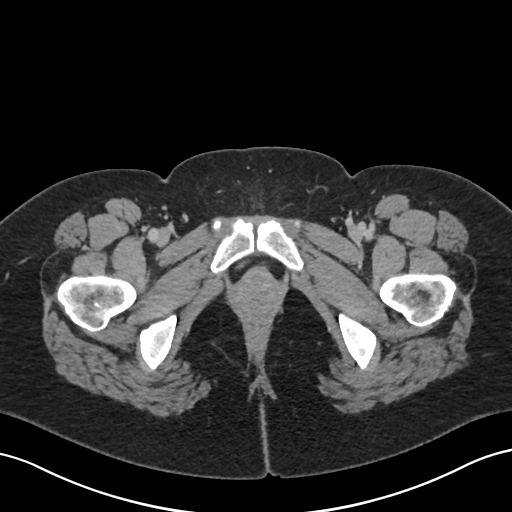
[im 68/272  soft-tissue]
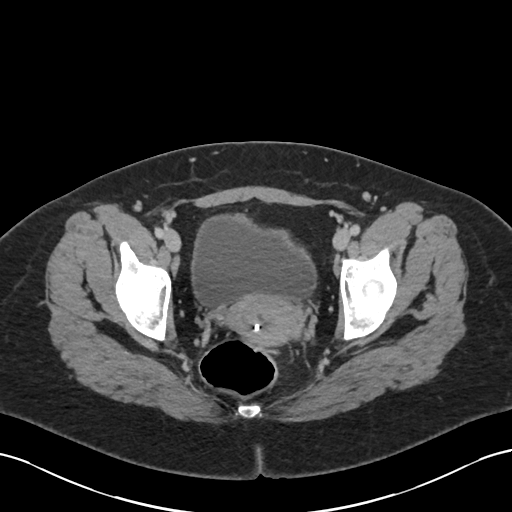
[im 95/272  soft-tissue]
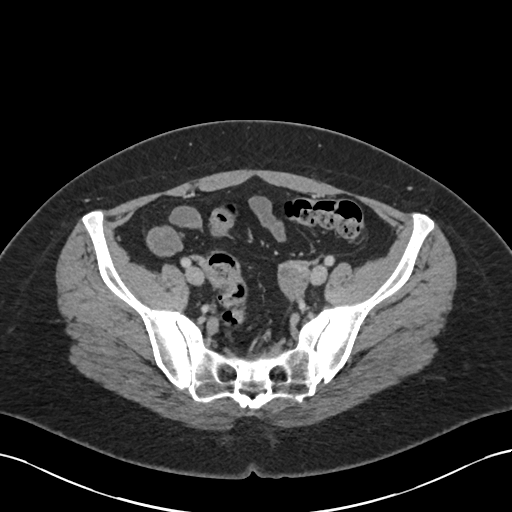
[im 109/272  soft-tissue]
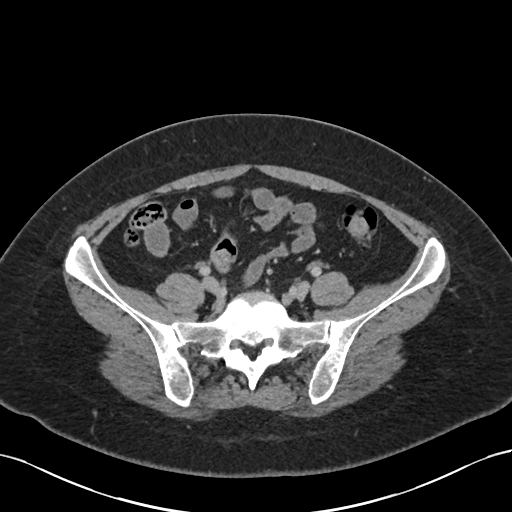
[im 136/272  soft-tissue]
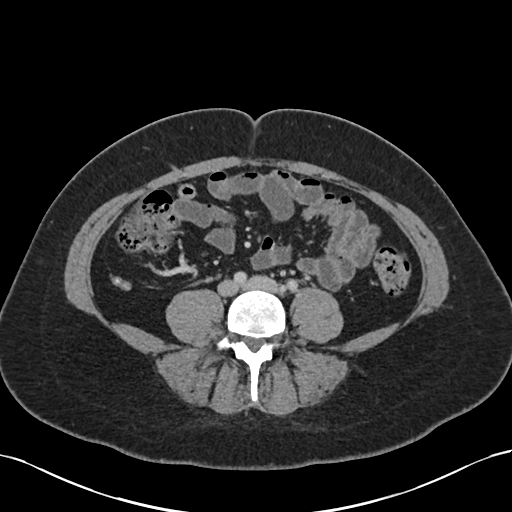
[im 163/272  soft-tissue]
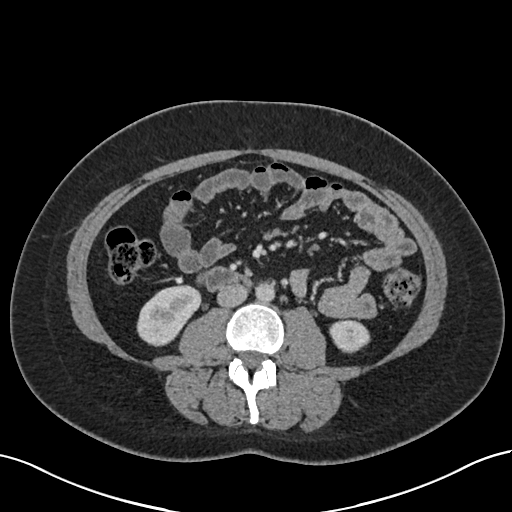
[im 177/272  soft-tissue]
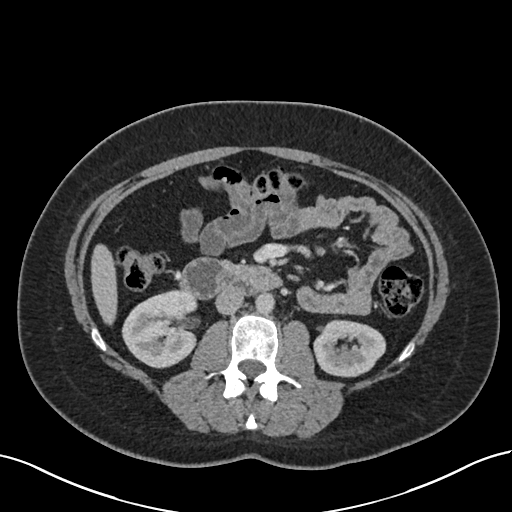
[im 204/272  soft-tissue]
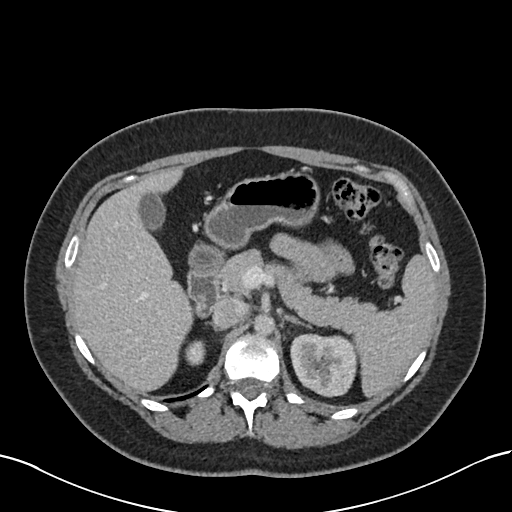
[im 204/272  bone]
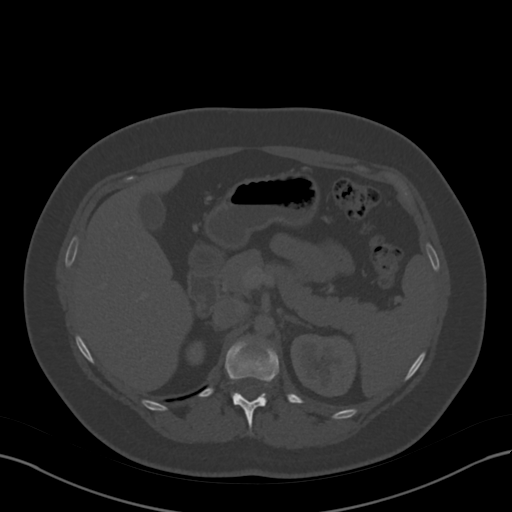
[im 231/272  soft-tissue]
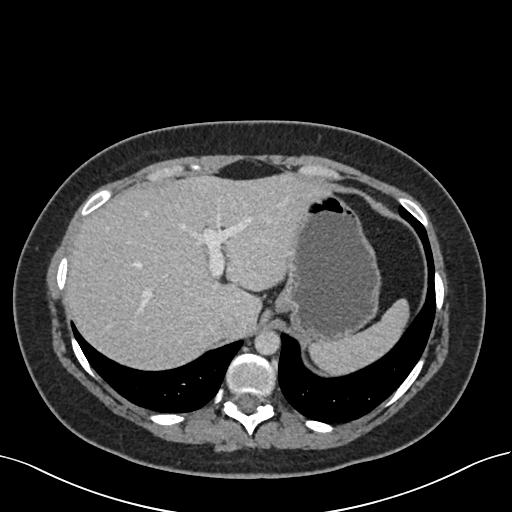
[im 258/272  soft-tissue]
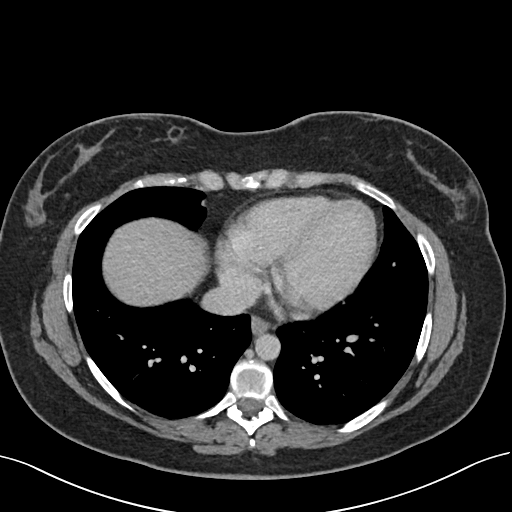

[Series 6: coronal · coronal · 0.70mm/px · 3 of 79 slices shown]
[im 27/79  soft-tissue]
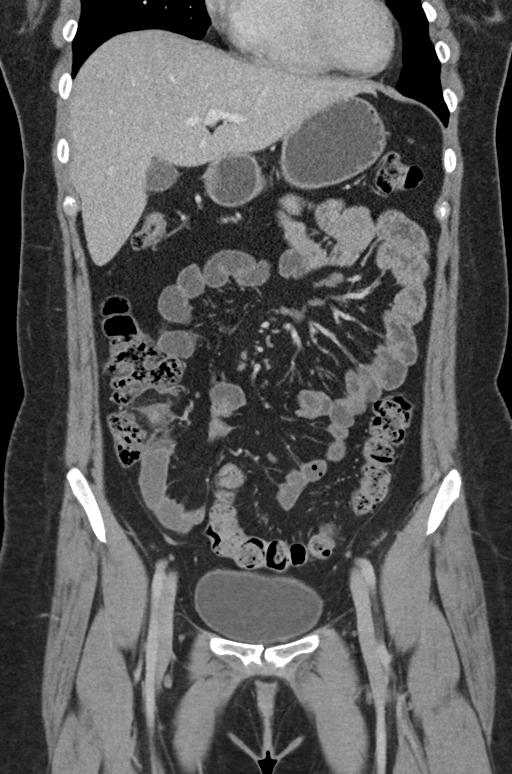
[im 35/79  soft-tissue]
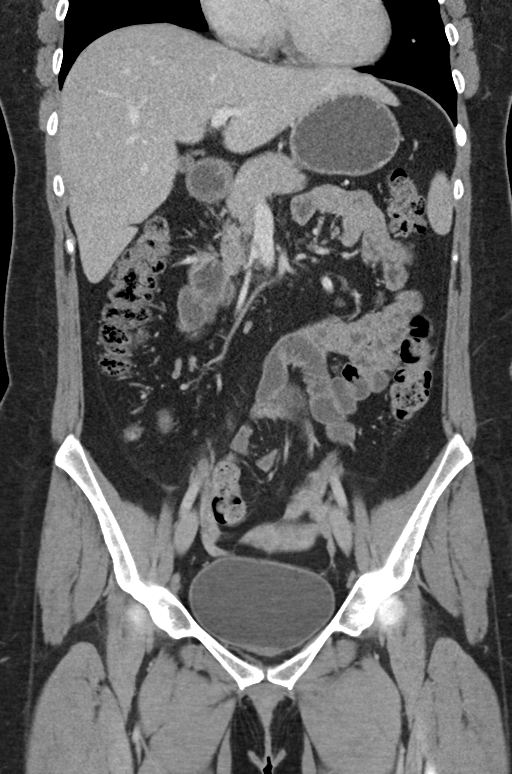
[im 44/79  soft-tissue]
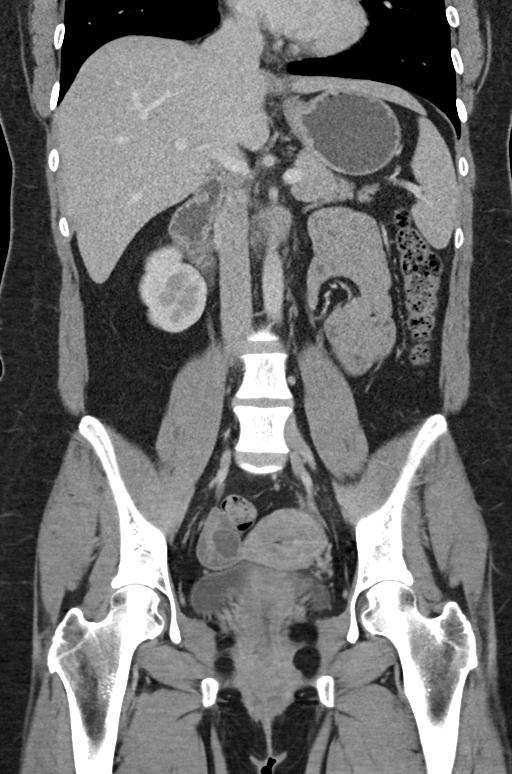

[14 of 46 positions shown; findings below may reference images not displayed]

RADIATION DOSE REDUCTION: This exam was performed according to the
departmental dose-optimization program which includes automated
exposure control, adjustment of the mA and/or kV according to
patient size and/or use of iterative reconstruction technique.

CONTRAST:  100mL OMNIPAQUE IOHEXOL 300 MG/ML SOLN, additional
negative oral enteric contrast
FINDINGS: Lower chest: No acute abnormality.

Hepatobiliary: No solid liver abnormality is seen. No gallstones,
gallbladder wall thickening, or biliary dilatation.

Pancreas: Unremarkable. No pancreatic ductal dilatation or
surrounding inflammatory changes.

Spleen: Normal in size without significant abnormality.

Adrenals/Urinary Tract: Adrenal glands are unremarkable. Kidneys are
normal, without renal calculi, solid lesion, or hydronephrosis.
Bladder is unremarkable.

Stomach/Bowel: Stomach is within normal limits. Appendix appears
normal. There is short segment circumferential wall thickening and
mucosal hyperenhancement of the terminal ileum, involving a segment
approximately 4 cm in length from the ileocecal valve (series 6,
image 30, series 2, image 59).

Vascular/Lymphatic: No significant vascular findings are present. No
enlarged abdominal or pelvic lymph nodes.

Reproductive: IUD is present in the uterus, although in a low lying
position, within the lower uterine segment and or endocervical canal
(series 7, image 54). Multiple bilateral ovarian follicles.

Other: No abdominal wall hernia or abnormality. No ascites.

Musculoskeletal: No acute or significant osseous findings.
IMPRESSION: 1. There is short segment circumferential wall thickening and
mucosal hyperenhancement of the terminal ileum, involving a segment
approximately 4 cm in length from the ileocecal valve. This
appearance is in keeping with Crohn's ileitis. No evidence of
complicating stricture, fistula, or abscess at this time. No other
evidence of bowel inflammation.
2. IUD is present in the uterus, although in an abnormal appearing
low lying position, within the lower uterine segment and or
endocervical canal. Consider pelvic ultrasound to assess appropriate
positioning.
3. No osseous stigmata of ankylosing spondylitis by CT.

These results will be called to the ordering clinician or
representative by the Radiologist Assistant, and communication
documented in the PACS or [REDACTED].

## 2024-01-11 ENCOUNTER — Telehealth: Payer: Self-pay | Admitting: Internal Medicine

## 2024-01-11 NOTE — Telephone Encounter (Signed)
 Toni Livingston with [facility] is calling to get the correct diagnostic code. He has z [id number] but it is missing a number. [name] return call to [phone]  reference # [id number]

## 2024-01-12 NOTE — Telephone Encounter (Signed)
 Spoke with Sari at [employer] [employer]. Diagnostic Codes Given.  Sari verbalized understanding with all questions answered.

## 2024-01-18 ENCOUNTER — Telehealth: Payer: Self-pay | Admitting: Internal Medicine

## 2024-01-18 NOTE — Telephone Encounter (Signed)
 [facility] Billing department rep named [place] called and stated that she needed to know this patient diagnostic codes. Best number to reach her at is [phone] please use reference number [id number]. Please advise.

## 2024-01-19 NOTE — Telephone Encounter (Signed)
 Spoke with [employer] at [facility].  Marylou stated that the labs were still Pending insurance with the previous codes. No additional codes provided.  Shiela verbalized understanding with all questions answered.

## 2024-01-30 ENCOUNTER — Ambulatory Visit: Admitting: Internal Medicine

## 2024-01-30 ENCOUNTER — Encounter: Payer: Self-pay | Admitting: Internal Medicine

## 2024-01-30 ENCOUNTER — Other Ambulatory Visit (INDEPENDENT_AMBULATORY_CARE_PROVIDER_SITE_OTHER)

## 2024-01-30 DIAGNOSIS — K649 Unspecified hemorrhoids: Secondary | ICD-10-CM | POA: Diagnosis not present

## 2024-01-30 DIAGNOSIS — K50019 Crohn's disease of small intestine with unspecified complications: Secondary | ICD-10-CM

## 2024-01-30 DIAGNOSIS — Z79899 Other long term (current) drug therapy: Secondary | ICD-10-CM | POA: Diagnosis not present

## 2024-01-30 LAB — CBC WITH DIFFERENTIAL/PLATELET
Basophils Absolute: 0 K/uL (ref 0.0–0.1)
Basophils Relative: 0.4 % (ref 0.0–3.0)
Eosinophils Absolute: 0.1 K/uL (ref 0.0–0.7)
Eosinophils Relative: 1.8 % (ref 0.0–5.0)
HCT: 40.6 % (ref 36.0–46.0)
Hemoglobin: 13.7 g/dL (ref 12.0–15.0)
Lymphocytes Relative: 20.7 % (ref 12.0–46.0)
Lymphs Abs: 1.4 K/uL (ref 0.7–4.0)
MCHC: 33.7 g/dL (ref 30.0–36.0)
MCV: 82.9 fl (ref 78.0–100.0)
Monocytes Absolute: 0.5 K/uL (ref 0.1–1.0)
Monocytes Relative: 6.9 % (ref 3.0–12.0)
Neutro Abs: 4.8 K/uL (ref 1.4–7.7)
Neutrophils Relative %: 70.2 % (ref 43.0–77.0)
Platelets: 379 K/uL (ref 150.0–400.0)
RBC: 4.9 Mil/uL (ref 3.87–5.11)
RDW: 15.7 % — ABNORMAL HIGH (ref 11.5–15.5)
WBC: 6.8 K/uL (ref 4.0–10.5)

## 2024-01-30 LAB — COMPREHENSIVE METABOLIC PANEL WITH GFR
ALT: 23 U/L (ref 0–35)
AST: 15 U/L (ref 0–37)
Albumin: 4.4 g/dL (ref 3.5–5.2)
Alkaline Phosphatase: 107 U/L (ref 39–117)
BUN: 11 mg/dL (ref 6–23)
CO2: 26 meq/L (ref 19–32)
Calcium: 9.5 mg/dL (ref 8.4–10.5)
Chloride: 105 meq/L (ref 96–112)
Creatinine, Ser: 0.57 mg/dL (ref 0.40–1.20)
GFR: 123.41 mL/min (ref >60.00)
Glucose, Bld: 85 mg/dL (ref 70–99)
Potassium: 4.2 meq/L (ref 3.5–5.1)
Sodium: 139 meq/L (ref 135–145)
Total Bilirubin: 0.3 mg/dL (ref 0.2–1.2)
Total Protein: 7.5 g/dL (ref 6.0–8.3)

## 2024-01-30 LAB — C-REACTIVE PROTEIN: CRP: 1.6 mg/dL (ref 0.5–20.0)

## 2024-01-30 MED ORDER — PREDNISONE 10 MG PO TABS
ORAL_TABLET | ORAL | 0 refills | Status: AC
Start: 2024-01-30 — End: 2024-03-26

## 2024-01-30 NOTE — Patient Instructions (Addendum)
 _______________________________________________________  If your blood pressure at your visit was 140/90 or greater, please contact your primary care physician to follow up on this.  _______________________________________________________  If you are age 29 or older, your body mass index should be between 23-30. Your Body mass index is 37.4 kg/m. If this is out of the aforementioned range listed, please consider follow up with your Primary Care Provider.  If you are age 54 or younger, your body mass index should be between 19-25. Your Body mass index is 37.4 kg/m. If this is out of the aformentioned range listed, please consider follow up with your Primary Care Provider.   ________________________________________________________  The [facility] providers would like to encourage you to use MYCHART to communicate with providers for non-urgent requests or questions.  Due to long hold times on the telephone, sending your provider a message by [facility] may be a faster and more efficient way to get a response.  Please allow 48 business hours for a response.  Please remember that this is for non-urgent requests.  _______________________________________________________  [facility] is using a team-based approach to care.  Your team is made up of your doctor and two to three APPS. Our APPS (Nurse Practitioners and Physician Assistants) work with your physician to ensure care continuity for you. They are fully qualified to address your health concerns and develop a treatment plan. They communicate directly with your gastroenterologist to care for you. Seeing the Advanced Practice Practitioners on your physician's team can help you by facilitating care more promptly, often allowing for earlier appointments, access to diagnostic testing, procedures, and other specialty referrals.   Your provider has requested that you go to the basement level for lab work before leaving today. Press B on the  elevator. The lab is located at the first door on the left as you exit the elevator.  We have sent the following medications to your pharmacy for you to pick up at your convenience:  START: Prednisone  10mg :Take 4 tablets (40 mg total) by mouth daily with breakfast for 7 days, THEN 3.5 tablets (35 mg total) daily with breakfast for 7 days, THEN 3 tablets (30 mg total) daily with breakfast for 7 days, THEN 2.5 tablets (25 mg total) daily with breakfast for 7 days, THEN 2 tablets (20 mg total) daily with breakfast for 7 days, THEN 1.5 tablets (15 mg total) daily with breakfast for 7 days, THEN 1 tablet (10 mg total) daily with breakfast for 7 days, THEN 0.5 tablets (5 mg total) daily with breakfast for 7 days.   Due to recent changes in healthcare laws, you may see the results of your imaging and laboratory studies on MyChart before your provider has had a chance to review them.  We understand that in some cases there may be results that are confusing or concerning to you. Not all laboratory results come back in the same time frame and the provider may be waiting for multiple results in order to interpret others.  Please give us  48 hours in order for your provider to thoroughly review all the results before contacting the office for clarification of your results.   Thank you for entrusting me with your care and choosing [facility].  Dr Federico

## 2024-01-30 NOTE — Progress Notes (Signed)
 Chief Complaint: Crohn's disease  HPI:    Ms. 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   Interval History: The last 3 weeks she has had stomach aches and diarrhea. She is having 3-4 BMs per day, which increased from her normal 1-2 BMs per day. These symptoms seemed to be prompted by a flare of her back pain. Cimzia  is not working. Her next dose of Cimzia  is tomorrow.  She anticipates trying to have another baby in a year. Denies fevers, sick contacts, or eating any recent foods that could have triggered her symptoms. She has been consistently on Cimzia  since 10/2023 and now only recently has had more issues with her Cimzia . She has been taking her methotrexate  therapy.  Wt Readings from Last 3 Encounters:  01/30/24 246 lb (111.6 kg)  12/04/23 242 lb (109.8 kg)  11/06/23 242 lb (109.8 kg)   IBD Characteristics: Type: Crohn's disease Phenotype: Inflammatory, stricturing Perianal involvement: No Location: Ileum (seen on colonoscopy and CT enterography in 2023) Diagnosed: 2023 Extraintestinal manifestations: Iritis, ankylosing spondylitis Previous therapies: Humira  (failed due to development of antibodies and loss of drug level) Current therapy: Cimzia  (started in 09/2021)   Current Outpatient Medications  Medication Sig Dispense Refill   certolizumab pegol  (CIMZIA , 2 SYRINGE,) 200 MG/ML prefilled syringe Inject 200 mg into the skin every 14 (fourteen) days. 1 each 6   cyanocobalamin  (VITAMIN B12) 1000 MCG/ML injection 1 ml subcut every x 4 wks then 1 ml subcut monthly and dispense with syringes 4 mL 11   folic acid  (FOLVITE ) 1 MG tablet TAKE 1 TABLET BY MOUTH EVERY DAY 90 tablet 1   methotrexate  50 MG/2ML injection  Inject 0.6 mLs (15 mg total) into the skin every 7 (seven) days. *DISCARD UNUSED REMAINDER 30 DAYS AFTER INITIAL USE 4 mL 3   ondansetron  (ZOFRAN -ODT) 4 MG disintegrating tablet Take 1 tablet (4 mg total) by mouth every 8 (eight) hours as needed for nausea or vomiting. 20 tablet 0   prednisoLONE acetate (PRED FORTE) 1 % ophthalmic suspension as needed.     sertraline (ZOLOFT) 25 MG tablet Take 25 mg by mouth daily.     Syringe/Needle, Disp, (SAFETY SYRINGE/NEEDLE) 27G X 1/2 1 ML MISC Single use for methotrexate  & B12 injections. Discard after use in safety sharps container. 100 each 0   triamcinolone  cream (KENALOG ) 0.1 % Apply 1 application. topically 2 (two) times daily as needed. 80 g 3   Vitamin D , Ergocalciferol , (DRISDOL ) 1.25 MG (50000 UNIT) CAPS capsule Take 1 capsule (50,000 Units total) by mouth every 7 (seven) days. 4 capsule 3   No current facility-administered medications for this visit.     Physical Exam:  Vital signs: BP 118/72   Pulse 81   Ht 5' 8 (1.727 m)   Wt 246 lb (111.6 kg)   BMI 37.40 kg/m   Constitutional:   Pleasant Caucasian female appears to be in NAD, Well developed, Well nourished, alert and cooperative Respiratory: Respirations even and unlabored. Lungs clear to auscultation bilaterally.   No wheezes, crackles, or rhonchi.  Cardiovascular: Normal S1, S2. No MRG. Regular rate Gastrointestinal:  Soft, nontender, nondistended Psychiatric:  Demonstrates good judgement and reason without abnormal affect or behaviors.  RELEVANT  LABS AND IMAGING: CBC    Component Value Date/Time   WBC 6.8 11/06/2023 1215   RBC 4.97 11/06/2023 1215   HGB 13.5 11/06/2023 1215   HCT 40.6 11/06/2023 1215   PLT 369.0 11/06/2023 1215   MCV 81.6 11/06/2023 1215   MCH 27.4 06/28/2023 1541   MCHC 33.3 11/06/2023 1215   RDW 14.5 11/06/2023 1215   LYMPHSABS 2.0 11/06/2023 1215   MONOABS 0.6 11/06/2023 1215   EOSABS 0.2 11/06/2023 1215   BASOSABS 0.1 11/06/2023 1215    CMP      Component Value Date/Time   NA 136 11/06/2023 1215   K 3.6 11/06/2023 1215   CL 104 11/06/2023 1215   CO2 25 11/06/2023 1215   GLUCOSE 82 11/06/2023 1215   BUN 11 11/06/2023 1215   CREATININE 0.64 11/06/2023 1215   CREATININE 0.73 06/28/2023 1541   CALCIUM  9.4 11/06/2023 1215   PROT 7.6 11/06/2023 1215   ALBUMIN 4.4 11/06/2023 1215   AST 16 11/06/2023 1215   ALT 18 11/06/2023 1215   ALKPHOS 107 11/06/2023 1215   BILITOT 0.4 11/06/2023 1215   GFRNONAA >60 11/07/2022 0258   GFRNONAA 122 10/06/2020 1132   GFRAA 141 10/06/2020 1132   Labs 08/2021: Humira  level <0.6. Humira  antibody level of 3820.   Labs 11/2021: CBC and CMP unremarkable.  Labs 02/2022: CBC and CMP unremarkable. Fecal calprotectin 100 mg (borderline). Quant gold negative. ESR mildly elevated at 21.   Labs 05/2022: CBC and CMP unremarkable  Labs 08/2022: [facility] pathogen panel. Stool test was positive for enteropathogenic E coli (EPEC). C dif was negative. Fecal calprotectin was elevated at 197. Cimzia  level at 39 and antibody level at 246. ESR elevated at 36. CRP elevated at 18.51. CBC nml.   Labs 10/2022: CBC with elevated WBC of 16.9 and low Hb of 11.6. CMP with low Na of 131, low K of 3.2, elevated alk phos of 144, low albumin of 3.2.   Labs 12/2022: CBC normal.  CMP with a mildly low potassium level of 3.4.  Cimzia  drug level was 43 and antibody levels 511.  CRP is elevated at 14.8.  ESR is elevated at 31.  Vitamin B12 was low at 202.  Vitamin D , iron, and folate were normal.  TPMT enzyme activity was normal.  Fecal calprotectin normal.  Labs 01/2023: CBC normal.  CMP normal.  QuantiFERON gold negative.  Labs 03/2023: CRP normal.  Cimzia  level 21.  Cimzia  antibody level elevated at 1657.  CBC and CMP normal.  Labs 05/2023: Quant gold negative  Labs 07/2023: CBC and CMP nml. Vit D low at 14. Vit B12 nml. CRP nml. Cimzia  level 20 and Cimzia  antibody 569. Fecal calprotectin elevated at 317.  Labs 10/2023: CBC nml  and CMP nml. CRP high sensitivity was elevated at 20.4. Cimizia level 17 and Cimzia  antibody level 333. Vit D low at 16.4  CT enterography 09/06/21: IMPRESSION: 1. There is short segment circumferential wall thickening and mucosal hyperenhancement of the terminal ileum, involving a segment approximately 4 cm in length from the ileocecal valve. This appearance is in keeping with Crohn's ileitis. No evidence of complicating stricture, fistula, or abscess at this time. No other evidence of bowel inflammation. 2. IUD is present in the uterus, although in an abnormal appearing low lying position, within the lower uterine segment and or endocervical canal. Consider pelvic ultrasound to assess appropriate positioning. 3. No osseous stigmata of ankylosing spondylitis by CT.  Colonoscopy 08/16/21: - Inflammed and strictured terminal  ileum, otherwise the examination was normal. - Biopsies taken from terminal ileum, right and left colon. - The examination was otherwise normal on direct and retroflexion views. Path: 1. Surgical [P], small bowel, terminal ileum - SEVERELY ACTIVE CHRONIC, NONSPECIFIC ILEITIS - NO GRANULOMAS, DYSPLASIA OR MALIGNANCY IDENTIFIED - SEE COMMENT 2. Surgical [P], right colon biopsy - BENIGN COLONIC MUCOSA - NO ACTIVE INFLAMMATION OR EVIDENCE OF MICROSCOPIC COLITIS - NO HIGH-GRADE DYSPLASIA OR MALIGNANCY IDENTIFIED 3. Surgical [P], left colon biopsy - BENIGN COLONIC MUCOSA - NO ACTIVE INFLAMMATION OR EVIDENCE OF MICROSCOPIC COLITIS - NO HIGH-GRADE DYSPLASIA OR MALIGNANCY IDENTIFIED Microscopic Comment 1. The biopsy is scant and not typical of inflammatory bowel disease. The differential diagnosis would include drugs, infection and inflammatory bowel disease. Radiologic correlation and patient follow-up is suggested. Dr. Rebbecca reviewed the case and agrees with the above diagnosis.  Assessment: Small bowel Crohn's disease Loose stools Abd discomfort Hemorrhoids Patient was  able to finally start getting her Cimzia  consistently again in 10/2023.  She is now on Cimzia  200 mg every 2 weeks.  She had been doing well until about 3 weeks ago when her symptoms were prompted by back pain flare that led to increased BMs and discomfort.  Thus we will go ahead and give her a steroid taper to see if this is able to get her symptoms under better control.  Although I had originally planned to switch her to Rinvoq (which would cover both her Crohn's and AS) if the Cimzia  was not working adequately, patient did tell me today that she plans to get pregnant again in 1 year and as of yet there is no evidence that Rinvoq is sufficiently safe to be taken during pregnancy.  Thus we will continue to keep her on Cimzia  for now.  Patient is agreeable to this plan for now.  We will see if she responds to the prednisone  therapy adequately.   Plan: - Plan to check labs with CBC, CMP, CRP, Quant gold, Cimzia  level and antibody - Cont vitamin D  and vitamin B12 supplements. - Start prednisone  40 mg daily for 7 days, decrease by 5 mg weekly - Start Cimzia  - Cont methotrexate  15 mcg SQ weekly - Cont folic acid  1 mg every day - Patient has a gynecologist for cervical exams - Cont vision exams with eye doctor - Patient is established with a dermatologist and gets regular skin checks - RTC in 2 months  I spent 40 minutes of time, including in depth chart review, independent review of results as outlined above, communicating results with the patient directly, face-to-face time with the patient, coordinating care, ordering studies and medications as appropriate, and documentation.

## 2024-01-31 LAB — HEPATITIS B SURFACE ANTIBODY,QUALITATIVE: Hep B S Ab: REACTIVE — AB

## 2024-02-04 LAB — QUANTIFERON-TB GOLD PLUS
Mitogen-NIL: 8.7 [IU]/mL
NIL: 0.02 [IU]/mL
QuantiFERON-TB Gold Plus: NEGATIVE
TB1-NIL: 0 [IU]/mL
TB2-NIL: 0.01 [IU]/mL

## 2024-02-06 ENCOUNTER — Other Ambulatory Visit: Payer: Self-pay | Admitting: Internal Medicine

## 2024-02-06 ENCOUNTER — Other Ambulatory Visit (HOSPITAL_BASED_OUTPATIENT_CLINIC_OR_DEPARTMENT_OTHER): Payer: Self-pay

## 2024-02-06 DIAGNOSIS — E559 Vitamin D deficiency, unspecified: Secondary | ICD-10-CM

## 2024-02-06 MED ORDER — VITAMIN D (ERGOCALCIFEROL) 1.25 MG (50000 UNIT) PO CAPS
50000.0000 [IU] | ORAL_CAPSULE | ORAL | 3 refills | Status: DC
  Filled 2024-02-06: qty 4, 28d supply, fill #0

## 2024-02-11 LAB — SERIAL MONITORING

## 2024-02-13 ENCOUNTER — Ambulatory Visit: Payer: Self-pay | Admitting: Internal Medicine

## 2024-02-13 LAB — CERTOLIZUMAB AND ANTI-CERTO AB
Anti-Certolizumab Ab Level: 267 ng/mL
Certolizumab DRUG Level: 20 ug/mL

## 2024-02-16 ENCOUNTER — Other Ambulatory Visit (HOSPITAL_BASED_OUTPATIENT_CLINIC_OR_DEPARTMENT_OTHER): Payer: Self-pay

## 2024-02-17 ENCOUNTER — Other Ambulatory Visit: Payer: Self-pay | Admitting: Medical Genetics

## 2024-03-03 ENCOUNTER — Other Ambulatory Visit: Payer: Self-pay | Admitting: Internal Medicine

## 2024-03-12 ENCOUNTER — Encounter: Payer: Self-pay | Admitting: Internal Medicine

## 2024-03-12 NOTE — Telephone Encounter (Signed)
 What medication

## 2024-03-13 ENCOUNTER — Telehealth: Payer: Self-pay

## 2024-03-13 ENCOUNTER — Other Ambulatory Visit (HOSPITAL_COMMUNITY): Payer: Self-pay

## 2024-03-13 DIAGNOSIS — Z79899 Other long term (current) drug therapy: Secondary | ICD-10-CM

## 2024-03-13 DIAGNOSIS — M458 Ankylosing spondylitis sacral and sacrococcygeal region: Secondary | ICD-10-CM

## 2024-03-13 DIAGNOSIS — H209 Unspecified iridocyclitis: Secondary | ICD-10-CM

## 2024-03-13 DIAGNOSIS — K50118 Crohn's disease of large intestine with other complication: Secondary | ICD-10-CM

## 2024-03-13 NOTE — Telephone Encounter (Signed)
 No problem. I'll get to work on this and it will be documented in a new encounter

## 2024-03-13 NOTE — Telephone Encounter (Signed)
 Pharmacy Patient Advocate Encounter   Received notification from Patient Advice Request messages that prior authorization for Cimzia  (2 Syringe) 200MG /ML syringe kit is required/requested.   Insurance verification completed.   The patient is insured through [facility] .   Per test claim: PA required; PA started via CoverMyMeds. KEY [id number] . Waiting for clinical questions to populate.

## 2024-03-13 NOTE — Telephone Encounter (Signed)
 Pharmacy Patient Advocate Encounter  Prior Authorization form/request asks a question that requires your assistance. Please see the question below and advise accordingly. The PA will not be submitted until the necessary information is received.

## 2024-03-26 NOTE — Telephone Encounter (Signed)
 Inbound call from [facility] with [facility] requesting new prescription for Cimzia .   Fax number [phone] Call back number   Please advise.

## 2024-03-27 MED ORDER — CIMZIA (2 SYRINGE) 200 MG/ML ~~LOC~~ PSKT: 200.0000 mg | PREFILLED_SYRINGE | SUBCUTANEOUS | 6 refills | Status: AC

## 2024-03-27 NOTE — Telephone Encounter (Signed)
 Cimzia  refill has been sent to [facility].

## 2024-03-27 NOTE — Addendum Note (Signed)
 Addended by: MERCER CRISTINO SAILOR on: 03/27/2024 08:35 AM   Modules accepted: Orders

## 2024-03-29 ENCOUNTER — Other Ambulatory Visit: Payer: Self-pay | Admitting: Internal Medicine

## 2024-03-29 ENCOUNTER — Other Ambulatory Visit (HOSPITAL_COMMUNITY)
Admission: RE | Admit: 2024-03-29 | Discharge: 2024-03-29 | Disposition: A | Discharge status: Home / Self Care | Payer: Self-pay | Source: Ambulatory Visit | Attending: Oncology | Admitting: Oncology

## 2024-04-01 ENCOUNTER — Ambulatory Visit: Admitting: Gastroenterology

## 2024-04-01 VITALS — BP 102/68 | HR 96 | Ht 68.0 in | Wt 248.0 lb

## 2024-04-01 DIAGNOSIS — Z79899 Other long term (current) drug therapy: Secondary | ICD-10-CM | POA: Diagnosis not present

## 2024-04-01 DIAGNOSIS — M458 Ankylosing spondylitis sacral and sacrococcygeal region: Secondary | ICD-10-CM

## 2024-04-01 DIAGNOSIS — K5000 Crohn's disease of small intestine without complications: Secondary | ICD-10-CM | POA: Diagnosis not present

## 2024-04-01 DIAGNOSIS — E559 Vitamin D deficiency, unspecified: Secondary | ICD-10-CM

## 2024-04-01 DIAGNOSIS — K50118 Crohn's disease of large intestine with other complication: Secondary | ICD-10-CM

## 2024-04-01 MED ORDER — VITAMIN D (ERGOCALCIFEROL) 1.25 MG (50000 UNIT) PO CAPS: 50000.0000 [IU] | ORAL_CAPSULE | ORAL | 3 refills | Status: DC

## 2024-04-01 NOTE — Patient Instructions (Addendum)
-   Cont vitamin D  and vitamin B12 supplements. -Continue cimzia  as scheduled - Cont methotrexate  15 mcg SQ weekly - Cont folic acid  1 mg every day - Cont vision exams with eye doctor -continue dermatology annual visits  If you have any issues please contact me via Mychart  _______________________________________________________  If your blood pressure at your visit was 140/90 or greater, please contact your primary care physician to follow up on this.  _______________________________________________________  If you are age 29 or older, your body mass index should be between 23-30. Your Body mass index is 37.71 kg/m. If this is out of the aforementioned range listed, please consider follow up with your Primary Care Provider.  If you are age 41 or younger, your body mass index should be between 19-25. Your Body mass index is 37.71 kg/m. If this is out of the aformentioned range listed, please consider follow up with your Primary Care Provider.   ________________________________________________________  The  GI providers would like to encourage you to use MYCHART to communicate with providers for non-urgent requests or questions.  Due to long hold times on the telephone, sending your provider a message by [facility] may be a faster and more efficient way to get a response.  Please allow 48 business hours for a response.  Please remember that this is for non-urgent requests.  _______________________________________________________  [facility] is using a team-based approach to care.  Your team is made up of your doctor and two to three APPS. Our APPS (Nurse Practitioners and Physician Assistants) work with your physician to ensure care continuity for you. They are fully qualified to address your health concerns and develop a treatment plan. They communicate directly with your gastroenterologist to care for you. Seeing the Advanced Practice Practitioners on your physician's team  can help you by facilitating care more promptly, often allowing for earlier appointments, access to diagnostic testing, procedures, and other specialty referrals.

## 2024-04-01 NOTE — Progress Notes (Signed)
 Chief Complaint: follow-up Crohns disease Primary GI Doctor: Dr. Federico   HPI: Toni Livingston is a 28 year old female with history of ileal Crohn's disease and ankylosing spondylitis presents for follow up of Crohn's disease.  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    Interval History    Patient presents for follow-up on Crohn's disease.  Patient seen by Dr. Federico back in August when she was experiencing mild flare with lower abdominal cramping and diarrhea.  Patient was started on a steroid taper which she has since then completed.  Patient states the lower abdominal pain and loose stools has completely resolved.  Patient is having 1 regular bowel movement daily. She is currently on Cimzia  every 2 weeks and Methotrexate  once weekly. Recently switched over to new insurance.    Patient recently started new job teaching Art and reports she has enjoyed it thus far.  No new issues.  IBD Characteristics: Type: Crohn's disease Phenotype: Inflammatory, stricturing Perianal involvement: No Location: Ileum (seen on colonoscopy and CT enterography in 2023) Diagnosed: 2023 Extraintestinal manifestations: Iritis, ankylosing spondylitis Previous therapies: Humira  (failed due to development of antibodies and loss of drug level) Current therapy: Cimzia  (started in 09/2021)   Wt Readings from Last 3 Encounters:  04/01/24 248 lb (112.5 kg)  01/30/24 246 lb (111.6 kg)  12/04/23 242 lb (109.8 kg)    Past Medical History:  Diagnosis Date   Ankylosing spondylitis (HCC)    Closed fracture of fifth metacarpal bone 07/19/2019   Crohn's disease (HCC)    Iritis    LGSIL of cervix of undetermined significance 11/14/2018   Normal labor 11/07/2022   SVD (spontaneous vaginal delivery) 11/07/2022   Vaginal high risk HPV DNA test positive 11/19/2018   Vertigo      Past Surgical History:  Procedure Laterality Date   BREAST REDUCTION SURGERY  2015   COLONOSCOPY  07/2021   HAND SURGERY Left 07/24/2019    Current Outpatient Medications  Medication Sig Dispense Refill   certolizumab pegol  (CIMZIA , 2 SYRINGE,) 200 MG/ML prefilled syringe Inject 200 mg into the skin every 14 (fourteen) days. 1 each 6   cyanocobalamin  (VITAMIN B12) 1000 MCG/ML injection 1 ML INTO THE SKIN EVERY X 4 WKS THEN 1 ML SUBCUT MONTHLY AND DISPENSE WITH SYRINGES 12 mL 3   folic acid  (FOLVITE ) 1 MG tablet TAKE 1 TABLET BY MOUTH EVERY DAY 90 tablet 1   methotrexate  50 MG/2ML injection INJECT 0.6 MLS (15 MG TOTAL) INTO THE SKIN EVERY 7 (SEVEN) DAYS. *DISCARD UNUSED REMAINDER 30 DAYS AFTER INITIAL USE 4 mL 3   ondansetron  (ZOFRAN -ODT) 4 MG disintegrating tablet Take 1 tablet (4 mg total) by mouth every 8 (eight) hours as needed for nausea or vomiting. 20 tablet 0   prednisoLONE acetate (PRED FORTE) 1 % ophthalmic suspension as needed.     Syringe/Needle, Disp, (SAFETY SYRINGE/NEEDLE) 27G X 1/2 1 ML MISC Single use for methotrexate  & B12 injections. Discard after use in safety sharps container. 100 each 0   triamcinolone  cream (KENALOG ) 0.1 % Apply 1 application. topically 2 (two) times daily as needed. 80 g 3   Vitamin D , Ergocalciferol , (DRISDOL ) 1.25 MG (50000 UNIT) CAPS capsule Take 1 capsule (50,000 Units total) by mouth every 7 (seven) days. 4 capsule 3   No current facility-administered medications for this visit.    Allergies as  of 04/01/2024   (No Known Allergies)    Family History  Problem Relation Age of Onset   Hypertension Mother    Colon polyps Father    Testicular cancer Father    Healthy Sister    Depression Sister    Anxiety disorder Sister    Healthy Brother    Healthy Brother    Diabetes Maternal Grandmother    Emphysema Maternal Grandmother    Breast cancer Maternal Grandmother    Diabetes Maternal Grandfather    Healthy Son    Uterine cancer  Maternal Aunt    Esophageal cancer Neg Hx     Review of Systems:    Constitutional: No weight loss, fever, chills, weakness or fatigue HEENT: Eyes: No change in vision               Ears, Nose, Throat:  No change in hearing or congestion Skin: No rash or itching Cardiovascular: No chest pain, chest pressure or palpitations   Respiratory: No SOB or cough Gastrointestinal: See HPI and otherwise negative Genitourinary: No dysuria or change in urinary frequency Neurological: No headache, dizziness or syncope Musculoskeletal: No new muscle or joint pain Hematologic: No bleeding or bruising Psychiatric: No history of depression or anxiety    Physical Exam:  Vital signs: BP 102/68   Pulse 96   Ht 5' 8 (1.727 m)   Wt 248 lb (112.5 kg)   BMI 37.71 kg/m   Constitutional:   Pleasant  female appears to be in NAD, Well developed, Well nourished, alert and cooperative. Throat: Oral cavity and pharynx without inflammation, swelling or lesion.  Respiratory: Respirations even and unlabored. Lungs clear to auscultation bilaterally.   No wheezes, crackles, or rhonchi.  Cardiovascular: Normal S1, S2. Regular rate and rhythm. No peripheral edema, cyanosis or pallor.  Gastrointestinal:  Soft, nondistended, nontender. No rebound or guarding. Normal bowel sounds. No appreciable masses or hepatomegaly. Rectal:  Not performed.  Msk:  Symmetrical without gross deformities. Without edema, no deformity or joint abnormality.  Neurologic:  Alert and  oriented x4;  grossly normal neurologically.  Skin:   Dry and intact without significant lesions or rashes.  RELEVANT LABS AND IMAGING: CBC    Latest Ref Rng & Units 01/30/2024   11:19 AM 11/06/2023   12:15 PM 07/26/2023    9:50 AM  CBC  WBC 4.0 - 10.5 K/uL 6.8  6.8  6.2   Hemoglobin 12.0 - 15.0 g/dL 86.2  86.4  85.5   Hematocrit 36.0 - 46.0 % 40.6  40.6  42.7   Platelets 150.0 - 400.0 K/uL 379.0  369.0  400.0      CMP     Latest Ref Rng & Units  01/30/2024   11:19 AM 11/06/2023   12:15 PM 07/26/2023    9:50 AM  CMP  Glucose 70 - 99 mg/dL 85  82  83   BUN 6 - 23 mg/dL 11  11  9    Creatinine 0.40 - 1.20 mg/dL 9.42  9.35  9.35   Sodium 135 - 145 mEq/L 139  136  139   Potassium 3.5 - 5.1 mEq/L 4.2  3.6  4.0   Chloride 96 - 112 mEq/L 105  104  105   CO2 19 - 32 mEq/L 26  25  24    Calcium  8.4 - 10.5 mg/dL 9.5  9.4  9.2   Total Protein 6.0 - 8.3 g/dL 7.5  7.6  7.7   Total Bilirubin 0.2 - 1.2 mg/dL 0.3  0.4  0.5   Alkaline Phos 39 - 117 U/L 107  107  106   AST 0 - 37 U/L 15  16  16    ALT 0 - 35 U/L 23  18  22       Lab Results  Component Value Date   TSH 2.24 05/31/2023   Labs 08/2021: Humira  level <0.6. Humira  antibody level of 3820.    Labs 11/2021: CBC and CMP unremarkable.   Labs 02/2022: CBC and CMP unremarkable. Fecal calprotectin 100 mg (borderline). Quant gold negative. ESR mildly elevated at 21.    Labs 05/2022: CBC and CMP unremarkable   Labs 08/2022: [facility] GI pathogen panel. Stool test was positive for enteropathogenic E coli (EPEC). C dif was negative. Fecal calprotectin was elevated at 197. Cimzia  level at 39 and antibody level at 246. ESR elevated at 36. CRP elevated at 18.51. CBC nml.    Labs 10/2022: CBC with elevated WBC of 16.9 and low Hb of 11.6. CMP with low Na of 131, low K of 3.2, elevated alk phos of 144, low albumin of 3.2.    Labs 12/2022: CBC normal.  CMP with a mildly low potassium level of 3.4.  Cimzia  drug level was 43 and antibody levels 511.  CRP is elevated at 14.8.  ESR is elevated at 31.  Vitamin B12 was low at 202.  Vitamin D , iron, and folate were normal.  TPMT enzyme activity was normal.  Fecal calprotectin normal.   Labs 01/2023: CBC normal.  CMP normal.  QuantiFERON gold negative.   Labs 03/2023: CRP normal.  Cimzia  level 21.  Cimzia  antibody level elevated at 1657.  CBC and CMP normal.   Labs 05/2023: Quant gold negative   Labs 07/2023: CBC and CMP nml. Vit D low at 14. Vit B12 nml. CRP nml.  Cimzia  level 20 and Cimzia  antibody 569. Fecal calprotectin elevated at 317.   Labs 10/2023: CBC nml and CMP nml. CRP high sensitivity was elevated at 20.4. Cimizia level 17 and Cimzia  antibody level 333. Vit D low at 16.4  Labs 01/2024: certolizumab drug level 20, wbc 6.8, hgb 13.7, plt 379, hep b s ab reactive, CRP 1.6, CMP normal, TB quant negative   CT enterography 09/06/21: IMPRESSION: 1. There is short segment circumferential wall thickening and mucosal hyperenhancement of the terminal ileum, involving a segment approximately 4 cm in length from the ileocecal valve. This appearance is in keeping with Crohn's ileitis. No evidence of complicating stricture, fistula, or abscess at this time. No other evidence of bowel inflammation. 2. IUD is present in the uterus, although in an abnormal appearing low lying position, within the lower uterine segment and or endocervical canal. Consider pelvic ultrasound to assess appropriate positioning. 3. No osseous stigmata of ankylosing spondylitis by CT.   Colonoscopy 08/16/21: - Inflammed and strictured terminal ileum, otherwise the examination was normal. - Biopsies taken from terminal ileum, right and left colon. - The examination was otherwise normal on direct and retroflexion views. Path: 1. Surgical [P], small bowel, terminal ileum - SEVERELY ACTIVE CHRONIC, NONSPECIFIC ILEITIS - NO GRANULOMAS, DYSPLASIA OR MALIGNANCY IDENTIFIED - SEE COMMENT 2. Surgical [P], right colon biopsy - BENIGN COLONIC MUCOSA - NO ACTIVE INFLAMMATION OR EVIDENCE OF MICROSCOPIC COLITIS - NO HIGH-GRADE DYSPLASIA OR MALIGNANCY IDENTIFIED 3. Surgical [P], left colon biopsy - BENIGN COLONIC MUCOSA - NO ACTIVE INFLAMMATION OR EVIDENCE OF MICROSCOPIC COLITIS - NO HIGH-GRADE DYSPLASIA OR MALIGNANCY IDENTIFIED Microscopic Comment 1. The biopsy is scant and not typical of  inflammatory bowel disease. The differential diagnosis would include drugs, infection and inflammatory bowel  disease. Radiologic correlation and patient follow-up is suggested. Dr. Rebbecca reviewed the case and agrees with the above diagnosis.    Assessment: Encounter Diagnoses  Name Primary?   Crohn's disease of colon with other complication (HCC) Yes   Vitamin D  deficiency    Ankylosing spondylitis of sacral region [facility])    High risk medication use   28 year old female patient with Small bowel Crohn's disease, doing well since completing steroid taper. Continue Cimzia  and methotrexate . Labwork UTD. Normal drug levels. CRP 1.6. Loose stools-resolved Abd discomfort-resolved Hemorrhoids-resolved  Plan: - Cont vitamin D  and vitamin B12 supplements. - Needs new script for Cimzia  with new insurance, sent message to pod B - Cont methotrexate  15 mcg SQ weekly - Cont folic acid  1 mg every day - Patient has a gynecologist for cervical exams - Cont vision exams with eye doctor - Patient is established with a dermatologist and gets regular skin checks -follow-up 6 mth with Dr. Federico  Thank you for the courtesy of this consult. Please call me with any questions or concerns.   America Sandall, FNP-C [facility] 04/01/2024, 3:37 PM  Cc: Catherine Fuller A, DO

## 2024-04-02 ENCOUNTER — Telehealth: Payer: Self-pay

## 2024-04-02 ENCOUNTER — Other Ambulatory Visit (HOSPITAL_COMMUNITY): Payer: Self-pay

## 2024-04-02 NOTE — Telephone Encounter (Signed)
 Pharmacy Patient Advocate Encounter   Received notification from Physician's Office that prior authorization for Cimzia  (2 Syringe) 200MG /ML syringe kit is required/requested.   Insurance verification completed.   The patient is insured through [facility].   Per test claim: PA required; PA started via CoverMyMeds. KEY [id number] . Waiting for clinical questions to populate.

## 2024-04-02 NOTE — Telephone Encounter (Signed)
 Left message for pt to call back

## 2024-04-02 NOTE — Telephone Encounter (Signed)
-----   Message from Cathryne PARAS May sent at 04/01/2024  2:18 PM EDT ----- Pod B-   Office visit in. She needed new cimzia  order with new insurance, looks like it was sent it? Should have my note if you need it. If you can make sure it is all addressed please.    Deanna, NP

## 2024-04-03 ENCOUNTER — Other Ambulatory Visit (HOSPITAL_COMMUNITY): Payer: Self-pay

## 2024-04-03 NOTE — Telephone Encounter (Signed)
 PA request has been Started. New Encounter has been or will be created for follow up. For additional info see Pharmacy Prior Auth telephone encounter from 04-02-2024.

## 2024-04-03 NOTE — Telephone Encounter (Signed)
 Pt made aware. Pt verbalized understanding with all questions answered.

## 2024-04-03 NOTE — Telephone Encounter (Signed)
 Pharmacy Patient Advocate Encounter  Received notification from [facility] that Prior Authorization for Cimzia  (2 Syringe) 200MG /ML syringe kit has been APPROVED from 04-03-2024 to 04-03-2025  Must be filled at outside specialty pharmacy. Insurance will not allow fill at [facility].   PA #/Case ID/Reference #: [id number]

## 2024-04-03 NOTE — Telephone Encounter (Signed)
 Patient returning call.

## 2024-04-03 NOTE — Progress Notes (Signed)
 ____________________________________________________________  Attending physician addendum:  Thank you for sending this case to me. I have reviewed the entire note and agree with the plan.  Last CBC/diff  and hepatic function panel done 2 months ago, and should be done Q 3 months on methotrexate .  So if they are not checked at her rheumatology visit next months, then please arrange that. Also arrange next clinic follow up with Dr. Federico after she returns from leave.  Victory Brand, MD  ____________________________________________________________

## 2024-04-03 NOTE — Telephone Encounter (Signed)
 Spoke with pt.  Pt stated that she has not received the medication. Pt stated that she has received messages from the pharmacy stated that they need office notes for the PA.  Please assist.

## 2024-04-03 NOTE — Telephone Encounter (Signed)
 Noted

## 2024-04-03 NOTE — Telephone Encounter (Signed)
 Pharmacy Patient Advocate Encounter    Per test claim: PA required; PA submitted to above mentioned insurance via Latent Key/confirmation #/EOC [facility] Status is pending

## 2024-04-05 ENCOUNTER — Telehealth: Payer: Self-pay

## 2024-04-05 NOTE — Telephone Encounter (Signed)
-----   Message from Cathryne PARAS May sent at 04/03/2024  7:31 AM EDT ----- Karna- Please let patient know doctor covering for Dr. Federico would like labs on her in next month if they are not done with another specialist with being on the methotrexate . If no plans to have done please order CBC/diff  and hepatic function panel to be done with our office in next month.  Also schedule fup with Dr federico in [place] if not done already.  Thanks  Cathryne, NP ----- Message ----- From: Legrand Victory LITTIE DOUGLAS, MD Sent: 04/03/2024   6:11 AM EDT To: Cathryne PARAS May, NP

## 2024-04-08 LAB — GENECONNECT MOLECULAR SCREEN: Genetic Analysis Overall Interpretation: NEGATIVE

## 2024-04-09 NOTE — Telephone Encounter (Signed)
 Me    04/03/24  1:22 PM Note Pt made aware.  Pt verbalized understanding with all questions answered.          04/03/24  1:15 PM Orrell, Rosina SAILOR, CPhT routed this conversation to [facility] Pod B Triage  Merle Rosina SAILOR, CPhT    04/03/24  1:15 PM Note Pharmacy Patient Advocate Encounter   Received notification from [facility] that Prior Authorization for Cimzia  (2 Syringe) 200MG /ML syringe kit has been APPROVED from 04-03-2024 to 04-03-2025   Must be filled at outside specialty pharmacy. Insurance will not allow fill at [facility].   PA #/Case ID/Reference #: [id number]

## 2024-04-12 ENCOUNTER — Telehealth: Payer: Self-pay

## 2024-04-12 NOTE — Telephone Encounter (Signed)
-----   Message from Cathryne PARAS May sent at 04/03/2024  7:31 AM EDT ----- Karna- Please let patient know doctor covering for Dr. Federico would like labs on her in next month if they are not done with another specialist with being on the methotrexate . If no plans to have done please order CBC/diff  and hepatic function panel to be done with our office in next month.  Also schedule fup with Dr federico in [place] if not done already.  Thanks  Cathryne, NP ----- Message ----- From: Legrand Victory LITTIE DOUGLAS, MD Sent: 04/03/2024   6:11 AM EDT To: Cathryne PARAS May, NP

## 2024-04-16 ENCOUNTER — Other Ambulatory Visit: Payer: Self-pay

## 2024-04-16 ENCOUNTER — Telehealth: Payer: Self-pay

## 2024-04-16 DIAGNOSIS — K50019 Crohn's disease of small intestine with unspecified complications: Secondary | ICD-10-CM

## 2024-04-16 NOTE — Telephone Encounter (Signed)
-----   Message from Cathryne PARAS May sent at 04/03/2024  7:31 AM EDT ----- Karna- Please let patient know doctor covering for Dr. Federico would like labs on her in next month if they are not done with another specialist with being on the methotrexate . If no plans to have done please order CBC/diff  and hepatic function panel to be done with our office in next month.  Also schedule fup with Dr federico in [place] if not done already.  Thanks  Cathryne, NP ----- Message ----- From: Legrand Victory LITTIE DOUGLAS, MD Sent: 04/03/2024   6:11 AM EDT To: Cathryne PARAS May, NP

## 2024-04-23 NOTE — Progress Notes (Unsigned)
 Office Visit Note  Patient: Toni Livingston             Date of Birth: Mar 21, 1996           MRN: [id number]             PCP: Catherine Charlies LABOR, DO Referring: Catherine Charlies LABOR, DO Visit Date: 05/07/2024 Occupation: Data Unavailable  Subjective:  Lower back pain  History of Present Illness: Toni Livingston is a 28 y.o. female with history of ankylosing spondylitis and crohn's disease.  Patient remains on  Cimzia  200 mg sq injections every 2 weeks, started September 21, 2021, Methotrexate  0.6 ml sq injections once weekly and folic acid  1 mg daily.  She is tolerating combination therapy without any side effects and has not had any recent gaps in therapy.  Patient's been experiencing increased lower back pain specifically in the lumbar spine.  Patient has been experiencing increased discomfort and stiffness with positional changes especially rising from a seated position.  She gets some relief with slight flexion of the lumbar spine.  She denies any nocturnal pain at this time.  She denies any SI joint pain.  She denies any Achilles tendinitis or plantar fasciitis.  She denies any joint swelling.  Patient has had intermittent soreness in the left eye but denies any redness.  She is occasional photophobia.  Patient states that overall her symptoms from Crohn's disease have been stable. She denies any recent or recurrent infections.  Activities of Daily Living:  Patient reports morning stiffness for 2-3 hours.   Patient Denies nocturnal pain.  Difficulty dressing/grooming: Denies Difficulty climbing stairs: Denies Difficulty getting out of chair: Denies Difficulty using hands for taps, buttons, cutlery, and/or writing: Denies  Review of Systems  Constitutional:  Negative for fatigue.  HENT:  Negative for mouth sores and mouth dryness.   Eyes:  Negative for dryness.  Respiratory:  Negative for shortness of breath.   Cardiovascular:  Negative for chest pain and palpitations.   Gastrointestinal:  Positive for constipation and diarrhea. Negative for blood in stool.  Endocrine: Negative for increased urination.  Genitourinary:  Negative for involuntary urination.  Musculoskeletal:  Positive for joint pain, joint pain, myalgias, morning stiffness, muscle tenderness and myalgias. Negative for gait problem, joint swelling and muscle weakness.  Skin:  Negative for color change, rash, hair loss and sensitivity to sunlight.  Allergic/Immunologic: Negative for susceptible to infections.  Neurological:  Negative for dizziness and headaches.  Hematological:  Negative for swollen glands.  Psychiatric/Behavioral:  Negative for depressed mood and sleep disturbance. The patient is not nervous/anxious.     PMFS History:  Patient Active Problem List   Diagnosis Date Noted   (BMI 30-39.9)-E66.9 05/31/2023    Past Medical History:  Diagnosis Date   Ankylosing spondylitis (HCC)    Closed fracture of fifth metacarpal bone 07/19/2019   Crohn's disease (HCC)    Iritis    LGSIL of cervix of undetermined significance 11/14/2018   Normal labor 11/07/2022   SVD (spontaneous vaginal delivery) 11/07/2022   Vaginal high risk HPV DNA test positive 11/19/2018   Vertigo     Family History  Problem Relation Age of Onset   Hypertension Mother    Colon polyps Father    Testicular cancer Father    Healthy Sister    Depression Sister    Anxiety disorder Sister    Healthy Brother    Healthy Brother    Uterine cancer Maternal Aunt    Diabetes Maternal  Grandmother    Emphysema Maternal Grandmother    Breast cancer Maternal Grandmother    Diabetes Maternal Grandfather    Healthy Son    Esophageal cancer Neg Hx    Past Surgical History:  Procedure Laterality Date   BREAST REDUCTION SURGERY  2015   COLONOSCOPY  07/2021   HAND SURGERY Left 07/24/2019   Social History   Tobacco Use   Smoking status: Never    Passive exposure: Past   Smokeless tobacco: Never  Vaping Use    Vaping status: Never Used  Substance Use Topics   Alcohol use: Yes    Comment: occ   Drug use: No   Social History   Social History Narrative   Single. Some college.    Works as [employer] officer.    Drinks caffeine.    Wears seatbelt. Smoke detector in the home.    Exercises routinely.    Feels safe in relationships.         Immunization History  Administered Date(s) Administered   Hepb-cpg 07/26/2023, 08/24/2023   Influenza, Seasonal, Injecte, Preservative Fre 05/31/2023   Influenza,inj,Quad PF,6+ Mos 03/09/2017, 03/05/2018, 02/27/2019, 06/04/2021   Influenza-Unspecified 03/20/2016, 04/04/2022   PFIZER(Purple Top)SARS-COV-2 Vaccination 03/23/2020, 04/13/2020, 08/07/2020   PPD Test 07/27/2016   Tdap 07/27/2016     Objective: Vital Signs: BP 116/81   Pulse 69   Temp 98.1 F (36.7 C)   Resp 15   Ht 5' 8 (1.727 m)   Wt 248 lb 6.4 oz (112.7 kg)   LMP 05/04/2024   BMI 37.77 kg/m    Physical Exam Vitals and nursing note reviewed.  Constitutional:      Appearance: She is well-developed.  HENT:     Head: Normocephalic and atraumatic.  Eyes:     Conjunctiva/sclera: Conjunctivae normal.  Cardiovascular:     Rate and Rhythm: Normal rate and regular rhythm.     Heart sounds: Normal heart sounds.  Pulmonary:     Effort: Pulmonary effort is normal.     Breath sounds: Normal breath sounds.  Abdominal:     General: Bowel sounds are normal.     Palpations: Abdomen is soft.  Musculoskeletal:     Cervical back: Normal range of motion.  Lymphadenopathy:     Cervical: No cervical adenopathy.  Skin:    General: Skin is warm and dry.     Capillary Refill: Capillary refill takes less than 2 seconds.  Neurological:     Mental Status: She is alert and oriented to person, place, and time.  Psychiatric:        Behavior: Behavior normal.      Musculoskeletal Exam: C-spine has good range of motion.  No midline spinal tenderness in the thoracic region.  Discomfort with  ROM of the lumbar spine, especially with extension.  Midline spinal tenderness in the lumbar region.  No SI joint tenderness.  Shoulder joints, elbow joints, wrist joints, MCPs, PIPs, DIPs have good range of motion with no synovitis.  Complete fist formation bilaterally.  Hip joints have good range of motion with no groin pain.  Knee joints have good range of motion no warmth or effusion.  Ankle joints have good range of motion with no tenderness or joint swelling.  No evidence of Achilles tendinitis.  CDAI Exam: CDAI Score: -- Patient Global: --; Provider Global: -- Swollen: 0 ; Tender: 1  Joint Exam 05/07/2024      Right  Left  Lumbar Spine   Tender  Investigation: No additional findings.  Imaging: No results found.  Recent Labs: Lab Results  Component Value Date   WBC 6.8 01/30/2024   HGB 13.7 01/30/2024   PLT 379.0 01/30/2024   NA 139 01/30/2024   K 4.2 01/30/2024   CL 105 01/30/2024   CO2 26 01/30/2024   GLUCOSE 85 01/30/2024   BUN 11 01/30/2024   CREATININE 0.57 01/30/2024   BILITOT 0.3 01/30/2024   ALKPHOS 107 01/30/2024   AST 15 01/30/2024   ALT 23 01/30/2024   PROT 7.5 01/30/2024   ALBUMIN 4.4 01/30/2024   CALCIUM  9.5 01/30/2024   GFRAA 141 10/06/2020   QFTBGOLDPLUS NEGATIVE 01/31/2024    Speciality Comments: humira - inadequate response Cimzia  started September 29, 2021  Procedures:  No procedures performed Allergies: Patient has no known allergies.   Assessment / Plan:     Visit Diagnoses: Ankylosing spondylitis of sacral region [facility]) - History of chronic SI joint pain, SI joint sclerosis on the x-rays, HLA-B27 positive. SI joints injected on 05/27/2020: Patient presents today with increased discomfort in the lower lumbar region. She has midline spinal tenderness in the lumbar region.  No SI joint tenderness upon palpation.  She has not been experiencing nocturnal pain.  Most of her lower lumbar discomfort has been with positional changes.  She gets some  relief with forward flexion of the lumbar spine.  Offered to update x-rays and an MRI for further evaluation.   She is also been experiencing intermittent soreness and photophobia affecting the left eye.  She has not noticed any eye redness and no conjunctival injection was noted today.  Different options were discussed today including increasing the dose of methotrexate  to 0.8 mL sq injections once weekly and folic acid  2 mg daily.  She would like to remain on Cimzia  200 mg sq injections every 14 days.  Discussed that methotrexate  may or may not provide significant relief with axial skeleton involvement.  Increase dose of methotrexate  should alleviate her left eye pain if related to an inflammatory process.  Plan to recheck sed rate and CRP today. She will notify us  if her symptoms persist or worsen at which time we can update imaging for further evaluation. - Plan: Sedimentation rate, C-reactive protein  High risk medication use - Cimzia  200 mg sq injections every 2 weeks, started September 21, 2021. Methotrexate  0.8 ml sq injections once weekly and folic acid  2 mg daily. Plan to increase methotrexate  to 0.8 ml sq injections once weekly.  Plan to increase folic acid  2 mg daily.  CBC and CMP updated on 01/30/24. Orders for CBC and CMP released today.  TB gold negative on 01/31/24.   No recent or recurrent infections.  Discussed the importance of holding cimzia  and methotrexate  if she develops signs or symptoms of an infection and to resume once the infection has completely cleared.  - Plan: CBC with Differential/Platelet, Comprehensive metabolic panel with GFR  Iridocyclitis - Dr. Maree.  Inadequate response to Humira . No signs of active inflammation on ocular exam on 08/30/22 and 07/07/23. She has had intermittent soreness and intermittent photophobia affecting the left eye.  No conjunctival injection was noted today.  She remains on Cimzia  200 mg Supples injections every 14 days.  Plan to increase the dose of  methotrexate  to 0.8 mg sq injections once weekly and folic acid  to 2 mg daily as discussed above.  - Plan: Sedimentation rate, C-reactive protein  HLA B27 positive  Crohn's disease of colon with other complication (HCC) - Under  the care of Dr. Federico. Methotrexate  added October 2024.  Reviewed Dr. Lafonda office visit note from 01/30/2024: At that time she was having increased bowel movements and abdominal discomfort--she was prescribed a prednisone  taper and encouraged to remain on Cimzia  and methotrexate  as prescribed. Plan to check sed rate and CRP today.  - Plan: Sedimentation rate, C-reactive protein  Chronic SI joint pain: No SI joint tenderness upon palpation.  No nocturnal pain.  ANA positive: No clinical features of systemic lupus at this time.  Other acne  Pilonidal cyst  Vaginal high risk HPV DNA test positive  Orders: Orders Placed This Encounter  Procedures   CBC with Differential/Platelet   Comprehensive metabolic panel with GFR   Sedimentation rate   C-reactive protein   No orders of the defined types were placed in this encounter.    Follow-Up Instructions: Return in about 5 months (around 10/05/2024) for Ankylosing Spondylitis.   Waddell CHRISTELLA Craze, PA-C  Note - This record has been created using Dragon software.  Chart creation errors have been sought, but may not always  have been located. Such creation errors do not reflect on  the standard of medical care.

## 2024-05-04 ENCOUNTER — Other Ambulatory Visit: Payer: Self-pay | Admitting: Internal Medicine

## 2024-05-07 ENCOUNTER — Encounter: Payer: Self-pay | Admitting: Physician Assistant

## 2024-05-07 ENCOUNTER — Ambulatory Visit: Attending: Physician Assistant | Admitting: Physician Assistant

## 2024-05-07 VITALS — BP 116/81 | HR 69 | Temp 98.1°F | Resp 15 | Ht 68.0 in | Wt 248.4 lb

## 2024-05-07 DIAGNOSIS — L0591 Pilonidal cyst without abscess: Secondary | ICD-10-CM

## 2024-05-07 DIAGNOSIS — M458 Ankylosing spondylitis sacral and sacrococcygeal region: Secondary | ICD-10-CM

## 2024-05-07 DIAGNOSIS — K50118 Crohn's disease of large intestine with other complication: Secondary | ICD-10-CM

## 2024-05-07 DIAGNOSIS — Z1589 Genetic susceptibility to other disease: Secondary | ICD-10-CM | POA: Diagnosis not present

## 2024-05-07 DIAGNOSIS — R7689 Other specified abnormal immunological findings in serum: Secondary | ICD-10-CM

## 2024-05-07 DIAGNOSIS — H209 Unspecified iridocyclitis: Secondary | ICD-10-CM

## 2024-05-07 DIAGNOSIS — Z79899 Other long term (current) drug therapy: Secondary | ICD-10-CM | POA: Diagnosis not present

## 2024-05-07 DIAGNOSIS — G8929 Other chronic pain: Secondary | ICD-10-CM

## 2024-05-07 DIAGNOSIS — L708 Other acne: Secondary | ICD-10-CM

## 2024-05-07 DIAGNOSIS — M533 Sacrococcygeal disorders, not elsewhere classified: Secondary | ICD-10-CM

## 2024-05-07 DIAGNOSIS — R87811 Vaginal high risk human papillomavirus (HPV) DNA test positive: Secondary | ICD-10-CM

## 2024-05-08 ENCOUNTER — Ambulatory Visit: Payer: Self-pay | Admitting: Physician Assistant

## 2024-05-08 ENCOUNTER — Other Ambulatory Visit: Payer: Self-pay | Admitting: *Deleted

## 2024-05-08 LAB — COMPREHENSIVE METABOLIC PANEL WITH GFR
AG Ratio: 1.5 (calc) (ref 1.0–2.5)
ALT: 22 U/L (ref 6–29)
AST: 17 U/L (ref 10–30)
Albumin: 4.3 g/dL (ref 3.6–5.1)
Alkaline phosphatase (APISO): 97 U/L (ref 31–125)
BUN: 8 mg/dL (ref 7–25)
CO2: 26 mmol/L (ref 20–32)
Calcium: 9.7 mg/dL (ref 8.6–10.2)
Chloride: 106 mmol/L (ref 98–110)
Creat: 0.73 mg/dL (ref 0.50–0.96)
Globulin: 2.9 g/dL (ref 1.9–3.7)
Glucose, Bld: 86 mg/dL (ref 65–99)
Potassium: 4.5 mmol/L (ref 3.5–5.3)
Sodium: 139 mmol/L (ref 135–146)
Total Bilirubin: 0.5 mg/dL (ref 0.2–1.2)
Total Protein: 7.2 g/dL (ref 6.1–8.1)
eGFR: 115 mL/min/1.73m2 (ref >=60)

## 2024-05-08 LAB — CBC WITH DIFFERENTIAL/PLATELET
Absolute Lymphocytes: 1926 {cells}/uL (ref 850–3900)
Absolute Monocytes: 480 {cells}/uL (ref 200–950)
Basophils Absolute: 42 {cells}/uL (ref 0–200)
Basophils Relative: 0.7 %
Eosinophils Absolute: 192 {cells}/uL (ref 15–500)
Eosinophils Relative: 3.2 %
HCT: 41.8 % (ref 35.0–45.0)
Hemoglobin: 13.5 g/dL (ref 11.7–15.5)
MCH: 27.4 pg (ref 27.0–33.0)
MCHC: 32.3 g/dL (ref 32.0–36.0)
MCV: 85 fL (ref 80.0–100.0)
MPV: 10 fL (ref 7.5–12.5)
Monocytes Relative: 8 %
Neutro Abs: 3360 {cells}/uL (ref 1500–7800)
Neutrophils Relative %: 56 %
Platelets: 401 Thousand/uL — ABNORMAL HIGH (ref 140–400)
RBC: 4.92 Million/uL (ref 3.80–5.10)
RDW: 13.6 % (ref 11.0–15.0)
Total Lymphocyte: 32.1 %
WBC: 6 Thousand/uL (ref 3.8–10.8)

## 2024-05-08 LAB — C-REACTIVE PROTEIN: CRP: 14.3 mg/L — ABNORMAL HIGH (ref <8.0)

## 2024-05-08 LAB — SEDIMENTATION RATE: Sed Rate: 9 mm/h (ref 0–20)

## 2024-05-08 MED ORDER — PREDNISONE 5 MG PO TABS: ORAL_TABLET | ORAL | 0 refills | Status: DC

## 2024-05-08 NOTE — Telephone Encounter (Signed)
 Patient states her current flare is worse. Patient states she has pain in her back and in SI joint that goes down her leg. Patient states bending forward to stretch helps. Walking and standing up straight makes it worse. Please advise.

## 2024-05-08 NOTE — Addendum Note (Signed)
 Addended by: CENA ALFONSO CROME on: 05/08/2024 01:48 PM   Modules accepted: Orders

## 2024-05-08 NOTE — Progress Notes (Signed)
 Platelet count is borderline elevated-401K-may be related to inflammatory process.  Rest of CBC WNL CMP WNL ESR WNL CRP is elevated-14.3--concerning for inflammatory process.  Patient was encouraged to increase the dose of methotrexate  to 0.8 ml sq once weekly injection and increase folic acid  to 2 mg daily at yesterday's visit.  Please forward results to GI as requested.

## 2024-05-08 NOTE — Telephone Encounter (Signed)
 Please clarify if she would like a prednisone  taper? If not, she can return for x-rays so we can proceed with a MRI for further evaluation.

## 2024-05-08 NOTE — Telephone Encounter (Signed)
 Reached out to patient and she states she would like a prednisone  taper.

## 2024-05-15 DIAGNOSIS — L821 Other seborrheic keratosis: Secondary | ICD-10-CM | POA: Diagnosis not present

## 2024-05-15 DIAGNOSIS — D1801 Hemangioma of skin and subcutaneous tissue: Secondary | ICD-10-CM | POA: Diagnosis not present

## 2024-05-15 DIAGNOSIS — D2372 Other benign neoplasm of skin of left lower limb, including hip: Secondary | ICD-10-CM | POA: Diagnosis not present

## 2024-05-15 DIAGNOSIS — L814 Other melanin hyperpigmentation: Secondary | ICD-10-CM | POA: Diagnosis not present

## 2024-05-15 DIAGNOSIS — D485 Neoplasm of uncertain behavior of skin: Secondary | ICD-10-CM | POA: Diagnosis not present

## 2024-05-15 DIAGNOSIS — D225 Melanocytic nevi of trunk: Secondary | ICD-10-CM | POA: Diagnosis not present

## 2024-06-01 ENCOUNTER — Other Ambulatory Visit: Payer: Self-pay | Admitting: Gastroenterology

## 2024-06-24 ENCOUNTER — Telehealth: Payer: Self-pay

## 2024-06-24 NOTE — Telephone Encounter (Signed)
 Submitted a Prior Authorization renewal request to [facility] for CIMZIA  via CoverMyMeds. Pending clinical questions to populate  Key: [id number]

## 2024-06-25 NOTE — Telephone Encounter (Signed)
 Per response on CMM: Your PA has been resolved, no additional PA is required. For further inquiries please contact the number on the back of the member prescription card. (Message [phone])

## 2024-06-26 NOTE — Telephone Encounter (Signed)
 Spoke with patient, appointment scheduled with Dr Federico  07/24/24 @ 8:30

## 2024-07-24 ENCOUNTER — Encounter: Payer: Self-pay | Admitting: Internal Medicine

## 2024-07-24 ENCOUNTER — Ambulatory Visit: Admitting: Internal Medicine

## 2024-07-24 ENCOUNTER — Other Ambulatory Visit (INDEPENDENT_AMBULATORY_CARE_PROVIDER_SITE_OTHER)

## 2024-07-24 VITALS — BP 116/76 | HR 64 | Ht 68.0 in | Wt 244.0 lb

## 2024-07-24 DIAGNOSIS — K50019 Crohn's disease of small intestine with unspecified complications: Secondary | ICD-10-CM

## 2024-07-24 LAB — COMPREHENSIVE METABOLIC PANEL WITH GFR
ALT: 21 U/L (ref 3–35)
AST: 16 U/L (ref 5–37)
Albumin: 4.2 g/dL (ref 3.5–5.2)
Alkaline Phosphatase: 102 U/L (ref 39–117)
BUN: 9 mg/dL (ref 6–23)
CO2: 24 meq/L (ref 19–32)
Calcium: 9.6 mg/dL (ref 8.4–10.5)
Chloride: 105 meq/L (ref 96–112)
Creatinine, Ser: 0.68 mg/dL (ref 0.40–1.20)
GFR: 117.87 mL/min
Glucose, Bld: 89 mg/dL (ref 70–99)
Potassium: 4.2 meq/L (ref 3.5–5.1)
Sodium: 138 meq/L (ref 135–145)
Total Bilirubin: 0.5 mg/dL (ref 0.2–1.2)
Total Protein: 7.7 g/dL (ref 6.0–8.3)

## 2024-07-24 LAB — CBC WITH DIFFERENTIAL/PLATELET
Basophils Absolute: 0.1 10*3/uL (ref 0.0–0.1)
Basophils Relative: 0.8 % (ref 0.0–3.0)
Eosinophils Absolute: 0.2 10*3/uL (ref 0.0–0.7)
Eosinophils Relative: 2.7 % (ref 0.0–5.0)
HCT: 41.5 % (ref 36.0–46.0)
Hemoglobin: 14.1 g/dL (ref 12.0–15.0)
Lymphocytes Relative: 33 % (ref 12.0–46.0)
Lymphs Abs: 2 10*3/uL (ref 0.7–4.0)
MCHC: 34.1 g/dL (ref 30.0–36.0)
MCV: 81 fl (ref 78.0–100.0)
Monocytes Absolute: 0.3 10*3/uL (ref 0.1–1.0)
Monocytes Relative: 4.7 % (ref 3.0–12.0)
Neutro Abs: 3.6 10*3/uL (ref 1.4–7.7)
Neutrophils Relative %: 58.8 % (ref 43.0–77.0)
Platelets: 398 10*3/uL (ref 150.0–400.0)
RBC: 5.12 Mil/uL — ABNORMAL HIGH (ref 3.87–5.11)
RDW: 14.9 % (ref 11.5–15.5)
WBC: 6.1 10*3/uL (ref 4.0–10.5)

## 2024-07-24 LAB — IBC + FERRITIN
Ferritin: 7.8 ng/mL — ABNORMAL LOW (ref 10.0–291.0)
Iron: 114 ug/dL (ref 42–145)
Saturation Ratios: 26.7 % (ref 20.0–50.0)
TIBC: 427 ug/dL (ref 250.0–450.0)
Transferrin: 305 mg/dL (ref 212.0–360.0)

## 2024-07-24 LAB — FOLATE: Folate: >23.4 ng/mL

## 2024-07-24 LAB — VITAMIN D 25 HYDROXY (VIT D DEFICIENCY, FRACTURES): VITD: 20.31 ng/mL — ABNORMAL LOW (ref 30.00–100.00)

## 2024-07-24 LAB — C-REACTIVE PROTEIN: CRP: 1.8 mg/dL (ref 1.0–20.0)

## 2024-07-24 NOTE — Patient Instructions (Addendum)
 Please go to the lab in the basement of our building to have lab work done as you leave today. Hit B for basement when you get on the elevator.  When the doors open the lab is on your left.  We will call you with the results. Thank you.   Please follow-up in 3 months.  Thank you for entrusting me with your care and for choosing [facility], Dr. Estefana Kidney  _______________________________________________________  If your blood pressure at your visit was 140/90 or greater, please contact your primary care physician to follow up on this.  _______________________________________________________  If you are age 30 or older, your body mass index should be between 23-30. Your Body mass index is 37.1 kg/m. If this is out of the aforementioned range listed, please consider follow up with your Primary Care Provider.  If you are age 30 or younger, your body mass index should be between 19-25. Your Body mass index is 37.1 kg/m. If this is out of the aformentioned range listed, please consider follow up with your Primary Care Provider.   ________________________________________________________  The [facility] providers would like to encourage you to use MYCHART to communicate with providers for non-urgent requests or questions.  Due to long hold times on the telephone, sending your provider a message by [facility] may be a faster and more efficient way to get a response.  Please allow 48 business hours for a response.  Please remember that this is for non-urgent requests.  _______________________________________________________  [facility] is using a team-based approach to care.  Your team is made up of your doctor and two to three APPS. Our APPS (Nurse Practitioners and Physician Assistants) work with your physician to ensure care continuity for you. They are fully qualified to address your health concerns and develop a treatment plan. They communicate directly with your  gastroenterologist to care for you. Seeing the Advanced Practice Practitioners on your physician's team can help you by facilitating care more promptly, often allowing for earlier appointments, access to diagnostic testing, procedures, and other specialty referrals.   Due to recent changes in healthcare laws, you may see the results of your imaging and laboratory studies on MyChart before your provider has had a chance to review them.  We understand that in some cases there may be results that are confusing or concerning to you. Not all laboratory results come back in the same time frame and the provider may be waiting for multiple results in order to interpret others.  Please give us  48 hours in order for your provider to thoroughly review all the results before contacting the office for clarification of your results.

## 2024-07-24 NOTE — Progress Notes (Signed)
 "  Chief Complaint: follow-up Crohns disease  HPI: Toni Livingston is a 29 year old female with history of ileal Crohn's disease and ankylosing spondylitis presents for follow up of Crohn's disease.  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 She has a young son at home.  Interval History She was on a higher dose of methotrexate  for a month (prescribed by her rheumatologist) after she had some increased back pain. She stopped taking her MTX halfway through December because she is going to try to start to get pregnant.. She is taking low dose birth control for another month and she is going to start trying to get pregnant. Unfortunately her recent dose of Cimzia  was delayed because of her insurance and due to the weather. She is going to try to get another dose of Cimzia  from rheumatology to be able to bridge her through since she is due for a dose of Cimzia  today. She has had diarrhea for the last couple of days. No cramping. Denies fevers. Denies sick contacts. She is having 2-3 BMs per day currently, which is above her usual 1-2 BMs. Denies ab pain. Denies back pain or joint stiffness.   IBD Characteristics: Type: Crohn's disease Phenotype: Inflammatory, stricturing Perianal involvement: No Location: Ileum (seen on colonoscopy and CT enterography in 2023) Diagnosed: 2023 Extraintestinal manifestations: Iritis, ankylosing spondylitis Previous therapies: Humira  (failed due to development of antibodies and loss of drug level), azathioprine  (caused diarrhea), methotrexate  (03/2023-05/2024, held due to her trying to become pregnant) Current therapy: Cimzia  (started in 09/2021)   Wt Readings from Last 3 Encounters:  07/24/24 244 lb (110.7 kg)  05/07/24 248 lb 6.4 oz (112.7 kg)  04/01/24 248 lb (112.5 kg)    Past Medical History:  Diagnosis Date   Ankylosing spondylitis (HCC)    Closed fracture of fifth metacarpal bone 07/19/2019   Crohn's disease (HCC)     Iritis    LGSIL of cervix of undetermined significance 11/14/2018   Normal labor 11/07/2022   SVD (spontaneous vaginal delivery) 11/07/2022   Vaginal high risk HPV DNA test positive 11/19/2018   Vertigo     Past Surgical History:  Procedure Laterality Date   BREAST REDUCTION SURGERY  2015   COLONOSCOPY  07/2021   HAND SURGERY Left 07/24/2019    Current Outpatient Medications  Medication Sig Dispense Refill   certolizumab pegol  (CIMZIA , 2 SYRINGE,) 200 MG/ML prefilled syringe Inject 200 mg into the skin every 14 (fourteen) days. 1 each 6   cyanocobalamin  (VITAMIN B12) 1000 MCG/ML injection 1 ML INTO THE SKIN EVERY X 4 WKS THEN 1 ML SUBCUT MONTHLY AND DISPENSE WITH SYRINGES 12 mL 3   folic acid  (FOLVITE ) 1 MG tablet TAKE 1 TABLET BY MOUTH EVERY DAY 90 tablet 1   ondansetron  (ZOFRAN -ODT) 4 MG disintegrating tablet Take 1 tablet (4 mg total) by mouth every 8 (eight) hours as needed for nausea or vomiting. 20 tablet 0   prednisoLONE acetate (PRED FORTE) 1 % ophthalmic suspension as needed.     Syringe/Needle, Disp, (SAFETY SYRINGE/NEEDLE) 27G X 1/2 1 ML MISC Single use for methotrexate  & B12 injections. Discard after use in safety sharps container. 100 each 0   triamcinolone  cream (KENALOG ) 0.1 % Apply 1 application. topically 2 (two) times daily as needed. 80 g 3   Vitamin D , Ergocalciferol , (DRISDOL ) 1.25 MG (50000 UNIT) CAPS capsule Take 1 capsule (50,000 Units total) by mouth every 7 (seven) days. 4 capsule 3   No current facility-administered medications  for this visit.    Allergies as of 07/24/2024   (No Known Allergies)    Family History  Problem Relation Age of Onset   Hypertension Mother    Colon polyps Father    Testicular cancer Father    Healthy Sister    Depression Sister    Anxiety disorder Sister    Healthy Brother    Healthy Brother    Uterine cancer Maternal Aunt    Diabetes Maternal Grandmother    Emphysema Maternal Grandmother    Breast cancer Maternal  Grandmother    Diabetes Maternal Grandfather    Healthy Son    Esophageal cancer Neg Hx       Physical Exam:  Vital signs: BP 116/76   Pulse 64   Ht 5' 8 (1.727 m)   Wt 244 lb (110.7 kg)   LMP 07/15/2024   SpO2 98%   BMI 37.10 kg/m   Constitutional:   Pleasant  female appears to be in NAD, Well developed, Well nourished, alert and cooperative. Respiratory: Respirations even and unlabored. Lungs clear to auscultation bilaterally.   No wheezes, crackles, or rhonchi.  Cardiovascular: Normal S1, S2. Regular rate and rhythm. No peripheral edema, cyanosis or pallor.  Gastrointestinal:  Soft, nondistended, nontender. No rebound or guarding. Normal bowel sounds Msk:  Symmetrical without gross deformities. Without edema, no deformity or joint abnormality.  Neurologic:  Alert and  oriented x4;  grossly normal neurologically.  Skin:   Dry and intact without significant lesions or rashes.  RELEVANT LABS AND IMAGING: CBC    Latest Ref Rng & Units 05/07/2024    8:16 AM 01/30/2024   11:19 AM 11/06/2023   12:15 PM  CBC  WBC 3.8 - 10.8 Thousand/uL 6.0  6.8  6.8   Hemoglobin 11.7 - 15.5 g/dL 86.4  86.2  86.4   Hematocrit 35.0 - 45.0 % 41.8  40.6  40.6   Platelets 140 - 400 Thousand/uL 401  379.0  369.0      CMP     Latest Ref Rng & Units 05/07/2024    8:16 AM 01/30/2024   11:19 AM 11/06/2023   12:15 PM  CMP  Glucose 65 - 99 mg/dL 86  85  82   BUN 7 - 25 mg/dL 8  11  11    Creatinine 0.50 - 0.96 mg/dL 9.26  9.42  9.35   Sodium 135 - 146 mmol/L 139  139  136   Potassium 3.5 - 5.3 mmol/L 4.5  4.2  3.6   Chloride 98 - 110 mmol/L 106  105  104   CO2 20 - 32 mmol/L 26  26  25    Calcium  8.6 - 10.2 mg/dL 9.7  9.5  9.4   Total Protein 6.1 - 8.1 g/dL 7.2  7.5  7.6   Total Bilirubin 0.2 - 1.2 mg/dL 0.5  0.3  0.4   Alkaline Phos 39 - 117 U/L  107  107   AST 10 - 30 U/L 17  15  16    ALT 6 - 29 U/L 22  23  18       Lab Results  Component Value Date   TSH 2.24 05/31/2023   Labs 08/2021:  Humira  level <0.6. Humira  antibody level of 3820.    Labs 11/2021: CBC and CMP unremarkable.   Labs 02/2022: CBC and CMP unremarkable. Fecal calprotectin 100 mg (borderline). Quant gold negative. ESR mildly elevated at 21.    Labs 05/2022: CBC and CMP unremarkable   Labs 08/2022:  [facility] GI pathogen panel. Stool test was positive for enteropathogenic E coli (EPEC). C dif was negative. Fecal calprotectin was elevated at 197. Cimzia  level at 39 and antibody level at 246. ESR elevated at 36. CRP elevated at 18.51. CBC nml.    Labs 10/2022: CBC with elevated WBC of 16.9 and low Hb of 11.6. CMP with low Na of 131, low K of 3.2, elevated alk phos of 144, low albumin of 3.2.    Labs 12/2022: CBC normal.  CMP with a mildly low potassium level of 3.4.  Cimzia  drug level was 43 and antibody levels 511.  CRP is elevated at 14.8.  ESR is elevated at 31.  Vitamin B12 was low at 202.  Vitamin D , iron, and folate were normal.  TPMT enzyme activity was normal.  Fecal calprotectin normal.   Labs 01/2023: CBC normal.  CMP normal.  QuantiFERON gold negative.   Labs 03/2023: CRP normal.  Cimzia  level 21.  Cimzia  antibody level elevated at 1657.  CBC and CMP normal.   Labs 05/2023: Quant gold negative   Labs 07/2023: CBC and CMP nml. Vit D low at 14. Vit B12 nml. CRP nml. Cimzia  level 20 and Cimzia  antibody 569. Fecal calprotectin elevated at 317.   Labs 10/2023: CBC nml and CMP nml. CRP high sensitivity was elevated at 20.4. Cimizia level 17 and Cimzia  antibody level 333. Vit D low at 16.4  Labs 01/2024: certolizumab drug level 20, wbc 6.8, hgb 13.7, plt 379, hep b s ab reactive, CRP 1.6, CMP normal, TB quant negative  Labs 04/2024: CBC with mildly elevated plts of 401. CMP nml. ESR nml. CRP elevated at 14.3   CT enterography 09/06/21: IMPRESSION: 1. There is short segment circumferential wall thickening and mucosal hyperenhancement of the terminal ileum, involving a segment approximately 4 cm in length from the  ileocecal valve. This appearance is in keeping with Crohn's ileitis. No evidence of complicating stricture, fistula, or abscess at this time. No other evidence of bowel inflammation. 2. IUD is present in the uterus, although in an abnormal appearing low lying position, within the lower uterine segment and or endocervical canal. Consider pelvic ultrasound to assess appropriate positioning. 3. No osseous stigmata of ankylosing spondylitis by CT.   Colonoscopy 08/16/21: - Inflammed and strictured terminal ileum, otherwise the examination was normal. - Biopsies taken from terminal ileum, right and left colon. - The examination was otherwise normal on direct and retroflexion views. Path: 1. Surgical [P], small bowel, terminal ileum - SEVERELY ACTIVE CHRONIC, NONSPECIFIC ILEITIS - NO GRANULOMAS, DYSPLASIA OR MALIGNANCY IDENTIFIED - SEE COMMENT 2. Surgical [P], right colon biopsy - BENIGN COLONIC MUCOSA - NO ACTIVE INFLAMMATION OR EVIDENCE OF MICROSCOPIC COLITIS - NO HIGH-GRADE DYSPLASIA OR MALIGNANCY IDENTIFIED 3. Surgical [P], left colon biopsy - BENIGN COLONIC MUCOSA - NO ACTIVE INFLAMMATION OR EVIDENCE OF MICROSCOPIC COLITIS - NO HIGH-GRADE DYSPLASIA OR MALIGNANCY IDENTIFIED Microscopic Comment 1. The biopsy is scant and not typical of inflammatory bowel disease. The differential diagnosis would include drugs, infection and inflammatory bowel disease. Radiologic correlation and patient follow-up is suggested. Dr. Rebbecca reviewed the case and agrees with the above diagnosis.    Assessment: Small bowel Crohn's disease Diarrhea Patient is off of her methotrexate  as of 05/2024 while she prepares to try to get pregnant. She continues on Cimzia  therapy and is mostly doing well on this. Over the last few days she has had slightly more diarrhea so we will keep an eye on this. Hopefully with her next  dose of Cimiza her diarrhea will resolve. If her diarrhea does not resolve, then may need to  consider further stool tests. Will check her labs today.   Plan: - Check CBC, CMP, CRP, folate, vitamin D , ferritin/IBC, Cimzia  level and antibody - Cont vitamin D  and vitamin B12 supplements. - Cont Cimzia  - Stopped methotrexate  while patient attempts to get pregnant - Cont folic acid  1 mg every day until methotrexate  wears off - Patient has a gynecologist for cervical exams - Cont vision exams with eye doctor - Patient is established with a dermatologist and gets regular skin checks - Patient needs flu shot, will try to get this today at a local pharmacy - RTC 3 months  Estefana Kidney, MD [facility] 07/24/2024, 9:07 AM  I spent 32 minutes of time, including in depth chart review, independent review of results as outlined above, communicating results with the patient directly, face-to-face time with the patient, coordinating care, and ordering studies and medications as appropriate, and documentation.   "

## 2024-07-25 ENCOUNTER — Other Ambulatory Visit: Payer: Self-pay | Admitting: Internal Medicine

## 2024-07-25 ENCOUNTER — Ambulatory Visit: Payer: Self-pay | Admitting: Internal Medicine

## 2024-07-25 DIAGNOSIS — E559 Vitamin D deficiency, unspecified: Secondary | ICD-10-CM

## 2024-07-25 MED ORDER — VITAMIN D (ERGOCALCIFEROL) 1.25 MG (50000 UNIT) PO CAPS: 50000.0000 [IU] | ORAL_CAPSULE | ORAL | 3 refills | Status: AC

## 2024-10-07 ENCOUNTER — Ambulatory Visit: Admitting: Physician Assistant
